# Patient Record
Sex: Female | Born: 1945
Health system: Southern US, Community
[De-identification: ages and names within clinical notes are randomized; demographics above are authoritative.]

## PROBLEM LIST (undated history)

## (undated) DIAGNOSIS — D49519 Neoplasm of unspecified behavior of unspecified kidney: Secondary | ICD-10-CM

## (undated) DIAGNOSIS — F329 Major depressive disorder, single episode, unspecified: Secondary | ICD-10-CM

## (undated) DIAGNOSIS — I209 Angina pectoris, unspecified: Secondary | ICD-10-CM

## (undated) DIAGNOSIS — I10 Essential (primary) hypertension: Secondary | ICD-10-CM

## (undated) DIAGNOSIS — R0789 Other chest pain: Secondary | ICD-10-CM

## (undated) DIAGNOSIS — F32A Depression, unspecified: Secondary | ICD-10-CM

## (undated) DIAGNOSIS — F0280 Dementia in other diseases classified elsewhere without behavioral disturbance: Secondary | ICD-10-CM

## (undated) DIAGNOSIS — E785 Hyperlipidemia, unspecified: Secondary | ICD-10-CM

## (undated) DIAGNOSIS — E669 Obesity, unspecified: Secondary | ICD-10-CM

## (undated) HISTORY — DX: Other chest pain: R07.89

## (undated) HISTORY — PX: HERNIA REPAIR: SHX51

## (undated) HISTORY — PX: ABDOMINAL HYSTERECTOMY: SHX81

## (undated) HISTORY — DX: Depression, unspecified: F32.A

## (undated) HISTORY — DX: Major depressive disorder, single episode, unspecified: F32.9

## (undated) HISTORY — DX: Obesity, unspecified: E66.9

## (undated) HISTORY — DX: Hyperlipidemia, unspecified: E78.5

## (undated) HISTORY — PX: TONSILLECTOMY: SUR1361

## (undated) HISTORY — PX: EMBOLIZATION: SHX1496

---

## 1999-11-10 ENCOUNTER — Ambulatory Visit (HOSPITAL_COMMUNITY): Admission: RE | Admit: 1999-11-10 | Discharge: 1999-11-10 | Payer: Self-pay | Admitting: Family Medicine

## 1999-11-10 ENCOUNTER — Encounter: Payer: Self-pay | Admitting: Family Medicine

## 1999-12-24 ENCOUNTER — Encounter: Payer: Self-pay | Admitting: Family Medicine

## 1999-12-24 ENCOUNTER — Ambulatory Visit (HOSPITAL_COMMUNITY): Admission: RE | Admit: 1999-12-24 | Discharge: 1999-12-24 | Payer: Self-pay | Admitting: Family Medicine

## 2000-02-02 ENCOUNTER — Other Ambulatory Visit: Admission: RE | Admit: 2000-02-02 | Discharge: 2000-02-02 | Payer: Self-pay | Admitting: Family Medicine

## 2001-01-31 ENCOUNTER — Encounter: Payer: Self-pay | Admitting: Emergency Medicine

## 2001-02-01 ENCOUNTER — Inpatient Hospital Stay (HOSPITAL_COMMUNITY): Admission: EM | Admit: 2001-02-01 | Discharge: 2001-02-04 | Payer: Self-pay | Admitting: Emergency Medicine

## 2001-02-16 ENCOUNTER — Encounter: Admission: RE | Admit: 2001-02-16 | Discharge: 2001-02-16 | Payer: Self-pay | Admitting: Family Medicine

## 2001-02-16 ENCOUNTER — Encounter: Payer: Self-pay | Admitting: Family Medicine

## 2001-02-17 ENCOUNTER — Emergency Department (HOSPITAL_COMMUNITY): Admission: EM | Admit: 2001-02-17 | Discharge: 2001-02-17 | Payer: Self-pay | Admitting: Emergency Medicine

## 2001-02-17 ENCOUNTER — Encounter: Payer: Self-pay | Admitting: Urology

## 2001-02-23 ENCOUNTER — Encounter: Admission: RE | Admit: 2001-02-23 | Discharge: 2001-02-23 | Payer: Self-pay | Admitting: Family Medicine

## 2001-03-16 ENCOUNTER — Encounter: Admission: RE | Admit: 2001-03-16 | Discharge: 2001-03-16 | Payer: Self-pay | Admitting: Urology

## 2001-03-16 ENCOUNTER — Encounter: Payer: Self-pay | Admitting: Urology

## 2001-03-28 ENCOUNTER — Encounter: Payer: Self-pay | Admitting: Urology

## 2001-03-28 ENCOUNTER — Ambulatory Visit (HOSPITAL_COMMUNITY): Admission: RE | Admit: 2001-03-28 | Discharge: 2001-03-29 | Payer: Self-pay | Admitting: Urology

## 2001-04-27 ENCOUNTER — Ambulatory Visit (HOSPITAL_COMMUNITY): Admission: AD | Admit: 2001-04-27 | Discharge: 2001-04-28 | Payer: Self-pay | Admitting: Interventional Radiology

## 2001-06-19 ENCOUNTER — Encounter: Payer: Self-pay | Admitting: Emergency Medicine

## 2001-06-19 ENCOUNTER — Inpatient Hospital Stay (HOSPITAL_COMMUNITY): Admission: EM | Admit: 2001-06-19 | Discharge: 2001-06-20 | Payer: Self-pay | Admitting: Emergency Medicine

## 2001-06-29 ENCOUNTER — Ambulatory Visit (HOSPITAL_COMMUNITY): Admission: RE | Admit: 2001-06-29 | Discharge: 2001-06-29 | Payer: Self-pay | Admitting: Urology

## 2001-06-29 ENCOUNTER — Encounter: Payer: Self-pay | Admitting: Urology

## 2001-09-13 ENCOUNTER — Encounter: Payer: Self-pay | Admitting: Emergency Medicine

## 2001-09-13 ENCOUNTER — Emergency Department (HOSPITAL_COMMUNITY): Admission: EM | Admit: 2001-09-13 | Discharge: 2001-09-13 | Payer: Self-pay | Admitting: Emergency Medicine

## 2001-10-01 ENCOUNTER — Other Ambulatory Visit: Admission: RE | Admit: 2001-10-01 | Discharge: 2001-10-01 | Payer: Self-pay | Admitting: Gynecology

## 2001-12-21 ENCOUNTER — Encounter: Payer: Self-pay | Admitting: Urology

## 2001-12-21 ENCOUNTER — Ambulatory Visit (HOSPITAL_COMMUNITY): Admission: RE | Admit: 2001-12-21 | Discharge: 2001-12-21 | Payer: Self-pay | Admitting: Urology

## 2002-05-24 ENCOUNTER — Ambulatory Visit (HOSPITAL_COMMUNITY): Admission: RE | Admit: 2002-05-24 | Discharge: 2002-05-24 | Payer: Self-pay | Admitting: Urology

## 2002-05-24 ENCOUNTER — Encounter: Payer: Self-pay | Admitting: Urology

## 2002-08-28 ENCOUNTER — Encounter: Payer: Self-pay | Admitting: Emergency Medicine

## 2002-08-28 ENCOUNTER — Emergency Department (HOSPITAL_COMMUNITY): Admission: EM | Admit: 2002-08-28 | Discharge: 2002-08-28 | Payer: Self-pay | Admitting: Emergency Medicine

## 2002-10-18 ENCOUNTER — Encounter: Payer: Self-pay | Admitting: Family Medicine

## 2002-10-18 ENCOUNTER — Ambulatory Visit (HOSPITAL_COMMUNITY): Admission: RE | Admit: 2002-10-18 | Discharge: 2002-10-18 | Payer: Self-pay | Admitting: Family Medicine

## 2002-10-31 HISTORY — PX: COLONOSCOPY: SHX174

## 2002-12-02 ENCOUNTER — Other Ambulatory Visit: Admission: RE | Admit: 2002-12-02 | Discharge: 2002-12-02 | Payer: Self-pay | Admitting: Gynecology

## 2003-01-20 ENCOUNTER — Ambulatory Visit (HOSPITAL_COMMUNITY): Admission: RE | Admit: 2003-01-20 | Discharge: 2003-01-20 | Payer: Self-pay | Admitting: Gastroenterology

## 2004-10-31 HISTORY — PX: CHOLECYSTECTOMY: SHX55

## 2005-01-04 ENCOUNTER — Emergency Department (HOSPITAL_COMMUNITY): Admission: EM | Admit: 2005-01-04 | Discharge: 2005-01-04 | Payer: Self-pay | Admitting: Emergency Medicine

## 2005-08-16 ENCOUNTER — Encounter: Admission: RE | Admit: 2005-08-16 | Discharge: 2005-08-16 | Payer: Self-pay | Admitting: Family Medicine

## 2005-08-26 ENCOUNTER — Inpatient Hospital Stay (HOSPITAL_COMMUNITY): Admission: EM | Admit: 2005-08-26 | Discharge: 2005-08-28 | Payer: Self-pay | Admitting: Emergency Medicine

## 2005-08-26 ENCOUNTER — Encounter: Admission: RE | Admit: 2005-08-26 | Discharge: 2005-08-26 | Payer: Self-pay | Admitting: Gastroenterology

## 2005-08-27 ENCOUNTER — Encounter (INDEPENDENT_AMBULATORY_CARE_PROVIDER_SITE_OTHER): Payer: Self-pay | Admitting: *Deleted

## 2006-10-10 ENCOUNTER — Encounter: Admission: RE | Admit: 2006-10-10 | Discharge: 2006-10-10 | Payer: Self-pay | Admitting: Family Medicine

## 2007-10-24 ENCOUNTER — Encounter: Admission: RE | Admit: 2007-10-24 | Discharge: 2007-10-24 | Payer: Self-pay | Admitting: Family Medicine

## 2007-11-01 HISTORY — PX: CARDIAC CATHETERIZATION: SHX172

## 2007-12-19 ENCOUNTER — Encounter: Admission: RE | Admit: 2007-12-19 | Discharge: 2007-12-19 | Payer: Self-pay | Admitting: General Surgery

## 2007-12-21 ENCOUNTER — Ambulatory Visit (HOSPITAL_BASED_OUTPATIENT_CLINIC_OR_DEPARTMENT_OTHER): Admission: RE | Admit: 2007-12-21 | Discharge: 2007-12-21 | Payer: Self-pay | Admitting: General Surgery

## 2007-12-21 ENCOUNTER — Encounter (INDEPENDENT_AMBULATORY_CARE_PROVIDER_SITE_OTHER): Payer: Self-pay | Admitting: General Surgery

## 2008-02-06 ENCOUNTER — Observation Stay (HOSPITAL_COMMUNITY): Admission: EM | Admit: 2008-02-06 | Discharge: 2008-02-07 | Payer: Self-pay | Admitting: Emergency Medicine

## 2009-02-20 ENCOUNTER — Ambulatory Visit: Payer: Self-pay | Admitting: Vascular Surgery

## 2009-02-20 ENCOUNTER — Emergency Department (HOSPITAL_COMMUNITY): Admission: EM | Admit: 2009-02-20 | Discharge: 2009-02-20 | Payer: Self-pay | Admitting: Emergency Medicine

## 2009-02-20 ENCOUNTER — Encounter (INDEPENDENT_AMBULATORY_CARE_PROVIDER_SITE_OTHER): Payer: Self-pay | Admitting: Emergency Medicine

## 2011-03-15 NOTE — H&P (Signed)
NAMEMAHA, FISCHEL          ACCOUNT NO.:  [id number]   MEDICAL RECORD NO.:  [id number]          PATIENT TYPE:  INP   LOCATION:  2013                         FACILITY:  [facility]   PHYSICIAN:  Colleen Can. Deborah Chalk, M.D.DATE OF BIRTH:  Jun 10, 1946   DATE OF ADMISSION:  02/06/2008  DATE OF DISCHARGE:                              HISTORY & PHYSICAL   REASON FOR ADMISSION:  Substernal chest pain and left arm pain, rule out  myocardial infarction.   HISTORY:  [name] is a 65 year old female who has had  hypertension over the last 1-2 years.  It has been difficult to control  and somewhat intermittently elevated.  She was due to be seen in  followup with Dr. Collins Scotland today for hypertension that has been elevated  for the past 2 weeks.  She had been having some increasing indigestion  and chest discomfort over the last few months, but has been very  intermittent.  She has not really told any one about that.  However,  today, she began having substernal discomfort that was in her chest and  radiated to her left arm.  She was given 2 nitroglycerin in [facility]  [facility] with some partial relief and then 2 nitroglycerin with EMS.  She  was a little short of breath and somewhat lightheaded.  She is referred  to the hospital for evaluation.  EKG had shown T-wave inversion  anteriorly.   About 2 weeks ago, she had some back spasms and subsequently developed  chest discomfort, but that lasted several hours through the middle of  the night and was not evaluated.   She has had a history of chest pain in the past.  She had cardiac  catheterization in 2002 with marked elevation of her blood pressure at  that point in time.  She just finished having embolization to a tumor in  her left kidney.  The catheterization at that point in time showed  normal coronary arteries.  She had a stress Cardiolite study in June  2007 for chest pain and had normal ejection fraction and no significant  ischemia,  but she did have what was felt to be breast attenuation  anteriorly.   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  She has had a  cholecystectomy, hysterectomy, and recently has had umbilical hernia  surgery.   ALLERGIES:  None known.   FAMILY HISTORY:  Positive for heart disease in the father as well as  diabetes and hypertension.   SOCIAL HISTORY:  She is married.  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  No tobacco or alcohol.   PHYSICAL EXAMINATION:  VITAL SIGNS:  She is obese with weight of  approximately 250-260 pounds.  Her blood pressure was 166/86 and heart  rate 67.  HEENT:  Negative.  NECK:  Supple.  LUNGS:  Clear.  HEART:  Regular rate and rhythm.  ABDOMEN:  Soft, obese.  EXTREMITIES:  Without edema.  Peripheral pulses are intact.  IMPRESSION:  1. Chest pain, rule out myocardial infarction.  2. Diabetes mellitus, on medication.  3. Hyperlipidemia.  4. Hypertension.  5. Obesity.   CURRENT MEDICINES:  1. Celexa 40 mg once a day.  2. Diovan 320 mg a day.  3. Simvastatin 40 mg a day.  4. Flexeril.  5. Fosamax 70 mg once a week.  6. Ibuprofen 800 mg as needed.  7. Metformin 500 mg twice a day.  8. Valtrex 500 mg once a day.  9. Verapamil ER 180 mg once a day.  10.Vicodin 500/5 p.o.   We will admit her, continue home medicines, rule out myocardial  infarction, treat her with IV nitroglycerin and IV heparin.  I think it  will be reasonable to proceed on with cardiac catheterization and  coronary arteriograms.  Hopefully, consider possibility of renal  arteriogram at the same time because of the poorly controlled  hypertension.      Colleen Can. Deborah Chalk, M.D.  Electronically Signed     SNT/MEDQ  D:  02/06/2008  T:  02/07/2008  Job:  [id number]   cc:   Tammy R. Collins Scotland, M.D.  Elmore Guise., M.D.

## 2011-03-15 NOTE — Discharge Summary (Signed)
[name], [name]          ACCOUNT NO.:  [id number]   MEDICAL RECORD NO.:  [id number]          PATIENT TYPE:  OBV   LOCATION:  2013                         FACILITY:  [facility]   PHYSICIAN:  Elmore Guise., M.D.DATE OF BIRTH:  [birth year]   DATE OF ADMISSION:  02/06/2008  DATE OF DISCHARGE:  02/07/2008                               DISCHARGE SUMMARY   DISCHARGE DIAGNOSES:  1. Chest pain.  2. Normal coronary arteries by cardiac catheterization.  3. Diabetes mellitus.  4. Dyslipidemia.  5. Hypertension.  6. Obesity.   HISTORY OF PRESENT ILLNESS:  Ms. Klostermann is a 65 year old white  female who presented with chest pain.  She was admitted for observation  and possible cardiac catheterization.   HOSPITAL COURSE:  The patient was seen by Dr. Deborah Chalk and was admitted  for observation overnight.  She was scheduled for cardiac  catheterization on February 07, 2008.  She tolerated her catheterization  well.  Her catheterization showed normal-appearing coronary arteries.  She had good blood pressure control during her hospitalization.  She had  no further chest pain.  Her cardiac markers were negative.  She will be  discharged today to continue post-catheterization restrictions for the  next 48 hours.   Her discharge medications include:  1. Celexa 40 mg daily.  2. Diovan 320 mg daily.  3. Simvastatin 40 mg daily.  4. Duratuss as needed for cough.  5. Flexeril 10 mg as needed for muscle spasm.  6. Fosamax 70 mg weekly.  7. Ibuprofen p.r.n.  8. Valtrex 500 mg daily.  9. Verapamil 180 mg daily.  10.Metformin 500 mg twice daily (patient is not to restart her      metformin until Sunday, April 12).  11.Nitroglycerin 0.4 mg on an as-needed basis for possible vasospasm      component of her symptoms.  12.Aspirin 81 mg daily.   Her follow-up appointment will be with Dr. Reyes Ivan at [facility] in 1-2 weeks.  All of her questions were answered prior to   discharge.      Elmore Guise., M.D.  Electronically Signed     TWK/MEDQ  D:  02/07/2008  T:  02/08/2008  Job:  [id number]

## 2011-03-15 NOTE — Cardiovascular Report (Signed)
[name], [name]          ACCOUNT NO.:  [id number]   MEDICAL RECORD NO.:  [id number]          PATIENT TYPE:  OBV   LOCATION:  2013                         FACILITY:  [facility]   PHYSICIAN:  Elmore Guise., M.D.DATE OF BIRTH:  1946/03/05   DATE OF PROCEDURE:  DATE OF DISCHARGE:                            CARDIAC CATHETERIZATION   DATE OF PROCEDURE:  February 07, 2008.   INDICATIONS FOR PROCEDURE:  Recurrent chest pain, multiple cardiac risk  factors, evaluate for obstructive coronary disease.   PROCEDURE DESCRIPTION:  The patient brought to the cardiac  catheterization lab after appropriate informed consent.  She was prepped  and draped in sterile fashion.  Approximately 10 mL of 1% lidocaine was  used for local anesthesia.  A 5-French sheath was placed in the right  femoral artery without difficulty.  Coronary angiography, LV angiography  were then performed.  The patient tolerated the procedure well and was  transferred from the cardiac catheterization lab in stable condition.   FINDINGS:  1. Left Main:  Normal.  2. LAD:  Normal.  3. D1:  Small-to-moderate sized vessel, normal appearing.  4. LCX:  Nondominant, normal appearing.  5. OM-1:  Large vessel, normal appearing.  6. RCA:  Dominant, normal appearing.  7. PDA/PLV:  Both patent and normal appearing.  8. LV:  EF of 60%.  No wall motion abnormalities.  LVEDP is 18 mmHg.   IMPRESSION:  1. Normal-appearing coronary arteries.  2. Preserved LV systolic function and EF of 60%.   PLAN:  1. At this time, I would recommend aggressive risk factor modification      as indicated.      Elmore Guise., M.D.  Electronically Signed     TWK/MEDQ  D:  02/07/2008  T:  02/07/2008  Job:  [id number]

## 2011-03-15 NOTE — Op Note (Signed)
NAMECARA, [name]          ACCOUNT NO.:  [id number]   MEDICAL RECORD NO.:  [id number]          PATIENT TYPE:  AMB   LOCATION:  DSC                          FACILITY:  [facility]   PHYSICIAN:  Cherylynn Ridges, M.D.    DATE OF BIRTH:  05/15/1946   DATE OF PROCEDURE:  12/21/2007  DATE OF DISCHARGE:                               OPERATIVE REPORT   PREOPERATIVE DIAGNOSIS:  Periumbilical incisional hernia.   POSTOPERATIVE DIAGNOSIS:  Periumbilical incisional hernia, 3 cm size of  ventral hernia defect.   PROCEDURE:  Repair of periumbilical incisional hernia with mesh.   SURGEON:  Cherylynn Ridges, M.D.   ANESTHESIA:  General endotracheal.   ESTIMATED BLOOD LOSS:  Less than 30 mL.   COMPLICATIONS:  No complications.   FINDINGS:  Omental incarceration in the hernia sac.   CONDITION:  Is stable.   SPECIMEN:  Was omentum and hernia sac.   INDICATIONS FOR OPERATION:  The patient is a 65 year old who had  undergone a laparoscopic cholecystectomy and developed a postoperative  incisional periumbilical hernia who comes in for repair.   FINDINGS:  The omentum was partially incarcerated in the sac itself.  There was a loop of bowel which could be pulled up into the wound with  the omentum but we left that behind and resected a small piece of  omentum along with the hernia sac.   PROCEDURE IN DETAIL:  The patient was taken to the operating room and  placed on the table in supine position.  After an adequate general  anesthetic was administered she was prepped and draped in the usual  sterile manner exposing the periumbilical area.   A 10 cm long infraumbilical curvilinear incision was made transversely  using a #10 blade.  We then dissected down into the subcutaneous tissue  around the large hernia sac which had incarcerated omentum contained  within.  We dissected out the hernia sac at its neck circumferentially  mobilizing it away from the skin superiorly.  This was done with  electrocautery and with Metzenbaum scissors.  We made an adequate  clearance around the fascia circumferentially in order to attach our  mesh once we had mobilized the sac and the contents.  We opened the sac  and then transected it at the fascial edge and then transected a piece  of omentum which fell back into the peritoneal cavity.  However, as we  were tenting it up we could see a loop of bowel  that was attached to it  that was far away from the area of resection.  The omentum was taken  with Tresa Endo clamps and 2-0 Vicryl ties.  Once this had fallen back into  the peritoneal cavity we reapproximated the fascia using interrupted #1  Novofil sutures.  A small piece of mesh measuring 5 x 3 cm in size was  attached on top of the repair, an onlay piece of polypropylene mesh  measuring 5 x 3 and attached in four quadrants using #1 Novofil pop-offs  and then we secured it in place with a running #1 Prolene.  Once this  was done we irrigated  it with antibiotic solution in which the mesh had  been soaked prior to implantation.  We closed in a couple layers.  The  subcu was closed using interrupted 3-0 Vicryl sutures and the skin was  closed using a running subcuticular stitch of 4-0 Monocryl.  Half  percent Marcaine without epi was injected into the wound, approximately  20 mL used.  Dermabond, Steri-Strips and Tegaderm were used to complete  the dressing.  All needle counts, sponge counts and instrument counts  were correct.      Cherylynn Ridges, M.D.  Electronically Signed     JOW/MEDQ  D:  12/21/2007  T:  12/22/2007  Job:  [id number]

## 2011-03-18 NOTE — Discharge Summary (Signed)
[place]. [facility]  Patient:    EMBERLY, TOMASSO Visit Number: [id number] MRN: [id number]          Service Type: MED Location: [id number] Attending Physician:  Eleanora Neighbor Dictated by:   Jennet Maduro Earl Gala, R.N., A.N.P. Adm. Date:  06/19/2001 Disc. Date: 06/20/2001   CC:         Barron Alvine, M.D.  Dario Guardian, M.D.   Discharge Summary  DISCHARGE DIAGNOSIS:  Chest pain with subsequent elective coronary angiography revealing normal coronary arteries and normal left ventricular function.  HISTORY OF PRESENT ILLNESS:  Ms. Lindquist is a very pleasant 65 year old, white female who has basically has had no past history of cardiac disorder. She has had some transient elevation in her blood pressure, but she is currently on no medications.  She presented to the emergency room with episodes of chest discomfort that started at about 9:30 a.m. while she was at work.  At that time, she had gross elevation in her blood pressure and was sent to the emergency room where she was given nitroglycerin as well as IV morphine.  Her discomfort did improve.  It was somewhat associated with nausea.  She was subsequently admitted for further evaluation.  She has had had a past history of a stress Cardiolite study in 1997, which was unremarkable.  Please see the dictated History and Physical for further patient presentation and profile.  LABORATORY DATA AND X-RAY FINDINGS:  Chemistries were unremarkable.  Cardiac enzymes were negative.  CBC was normal.  EKG showed nonspecific ST and T wave changes.  HOSPITAL COURSE:  The patient was admitted to telemetry.  She ruled out negative for myocardial infarction.  She was maintained on IV nitroglycerin and heparin which had been started in the emergency room department.  We proceeded on with cardiac catheterization on June 20, 2001.  The procedure was tolerated well without any unknown complications.   The coronaries were noted to be normal.  Left ventricular function was also normal.  There were angiograms taken of the kidneys.  She has had previous embolization and these films will be available for radiology as well as for Dr. Noralee Chars review at their discretion.  Our plans are now made for her to have her nitroglycerin discontinued.  She will be transferred back to the floor.  After satisfactory bed rest, she is felt to be a stable candidate for discharge later on this evening with further evaluation on an outpatient basis.  CONDITION ON DISCHARGE:  Stable.  DISCHARGE MEDICATIONS:  None at this time.  ACTIVITY:  Light for the next few days.  WOUND CARE:  She is to remove the dressing from the groin in the morning and to apply a Band-Aid with Neosporin for the next two to three days.  FOLLOWUP:  She will follow up with our office with any problems that arise. We will have her monitor her blood pressure daily and keep a diary of this and follow up with Dr. Merri Brunette over the next two to three weeks or sooner if any problems arise. Dictated by:   Jennet Maduro Earl Gala, R.N., A.N.P. Attending Physician:  Eleanora Neighbor DD:  06/20/01 TD:  06/21/01 Job: [id number]

## 2011-03-18 NOTE — Consult Note (Signed)
[place]. [facility]  Patient:    [name], [name]                            MRN: [id number] Adm. Date:  [id number] Disc. Date: [id number] Attending:  Cathren Laine CC:         Tammy R. Collins Scotland, M.D.   Consultation Report  CHIEF COMPLAINT:  Increasing left flank pain.  HISTORY OF PRESENT ILLNESS:  Ms. Moskal is well known to me from a previous admission.  She is a 65 year old female.  She presented on the third of this month through the [facility] Emergency Room with severe left flank pain.  She was thought to have renal colic but a CT scan revealed a retroperitoneal hemorrhage.  She also had two lesions in her kidney consistent with angiomyolipomas and the feeling was that the patient had had a spontaneous bleed into the perinephric space because of one of the angiomyolipomas.  She was hospitalized for pain control and observed.  Her hemoglobin was 13.6; had gone down to approximately 8.  She remained stable and was eventually discharged.  She was seen recently in consultation in our office for followup and her hemoglobin had increased to 10.  Her flank pain was improving.  She contacted me today with some increasing left flank pain and also some increased discomfort with deep breath suggesting some pleuritic component.  We felt that given her increased pain it would be worthwhile having a repeat hemoglobin performed as well as a repeat CT scan to see how things looked.  We wanted to be sure she was not developing any other obvious increased bleed or evidence of other pathology.  The patient did present to the emergency room. A hemoglobin was noted to be increased to 11.1.  Her white blood cell count was normal.  Vital signs were all within normal limits.  She had a repeat abdominal and pelvic CT with IV and oral contrast.  This showed slight decrease in the size of her perinephric hematoma.  There continued to be two lesions in her kidney suspicious for  angiomyolipoma.  PAST MEDICAL HISTORY:  Unchanged from my previous evaluation with her.  FAMILY HISTORY:  Unchanged from my previous evaluation with her.  SOCIAL HISTORY:  Unchanged from my previous evaluation with her.  REVIEW OF SYSTEMS:  Unchanged from my previous evaluation with her.  PHYSICAL EXAMINATION  GENERAL:  She is a well-developed, well-nourished female.  She does not appear to be in any acute distress at this time.  VITAL SIGNS:  Blood pressure 121/82, pulse 95.  She is afebrile.  ABDOMEN:  Soft.  She has some mild left CVA tenderness but it is not really severe.  DATA:  In addition to her normal hemoglobin and white count she has normal renal function and normal urinalysis.  ASSESSMENT:  Stable left perinephric hematoma.  If anything, this has slightly decreased in size.  Her pain may be due to some mild diaphragmatic irritation or even a small little reactive pleural effusion.  I see no evidence of anything else more significant.  She will keep a normal followup for additional repeat imaging studies.  Eventually she will require either partial nephrectomy or possible angio ablation of this lesion. DD:  02/17/01 TD:  02/19/01 Job: [id number]

## 2011-03-18 NOTE — Op Note (Signed)
NAMEKYLENA, MOLE          ACCOUNT NO.:  [id number]   MEDICAL RECORD NO.:  [id number]          PATIENT TYPE:  INP   LOCATION:  6729                         FACILITY:  [facility]   PHYSICIAN:  Cherylynn Ridges, M.D.    DATE OF BIRTH:  02-Mar-1946   DATE OF PROCEDURE:  08/27/2005  DATE OF DISCHARGE:  08/28/2005                                 OPERATIVE REPORT   PREOPERATIVE DIAGNOSIS:  Cholelithiasis and acute cholecystitis.   POSTOPERATIVE DIAGNOSIS:  Chronic cholecystitis with abdominal adhesions.   PROCEDURES:  1.  Laparoscopic cholecystectomy with cholangiogram.  2.  Laparoscopic enterolysis.   SURGEON:  Cherylynn Ridges, M.D.   ASSISTANT:  Currie Paris, M.D.   ANESTHESIA:  General endotracheal.   INDICATIONS FOR OPERATION:  The patient is a 65 year old female with  abdominal pain and right upper quadrant and known gallstones who comes in  for cholecystectomy.   FINDINGS:  The patient had, in spite of not having had any laparoscopy were  laparotomy performed, significant number of abdominal adhesions and right  upper quadrant. These had to be taken down initially.  The gallbladder was  adhered to surrounding structures with chronic adhesions.  No acute  inflammation noted.   OPERATION:  The patient was taken to the operating room, placed on table in  supine position. After an adequate endotracheal anesthetic was administered,  she was prepped and draped in usual sterile manner exposing the midline and  right upper quadrant.   A supraumbilical curvilinear incision was made using #11 blade and taken  down to the midline fascia. It was at this level that the fascia was grasped  using Kocher clamps and an incision made between it using a #11 blade. We  bluntly dissected down into the peritoneum through the fascial incision and  then used a 0 Vicryl pursestring suture to put in place to secure the [facility]  cannula. We then passed the Hasson cannula into the peritoneal  cavity and  then insufflated carbon dioxide gas up to maximal intra-abdominal pressure  of 50 mmHg.   With the Hasson cannulae in place we could see there were multiple abdominal  adhesions in the right upper quadrant, some of which we could get around  using the laparoscope. We were able to place a subxiphoid 11 mm cannula and  then subsequently using laparoscopic scissors in order to taken down these  adhesions to free up the right upper quadrant intraperitoneal space. Once  this was done, two 5 mm cannulas were passed under direct vision. Once this  was done, the patient was placed in reverse Trendelenburg, the left side was  tilted down and the dissection begun.   We grasped the gallbladder with a ratcheted grasper and there were omental  adhesions to the dome and the body. We dissected these away bluntly using a  grasper and then freed up the infundibulum. We identified the peritoneum  overlying the triangle of Calot and hepatic duodenal triangle and bluntly  dissected out the cystic duct and the cystic artery. We were able to get  adequate levels and encircled both the duct and artery, placed Endoclips  on  the artery initially and transected. We then circumferentially identified  the cystic duct placed a clip along the gallbladder side and did a  cholecystodochotomy through which a Cook catheter was passed in order to  perform a cholangiogram. The cholangiogram showed good flow into the common  duct, good proximal flow but initially flow into the duodenum did not go. We  waited for approximately 10 minutes and still there was no flow into the  duodenum, however, it did pass through once the patient had glucagon on  board for approximately three minutes. There was good flow into the  duodenum. No evidence of intraductal stones.   The [facility] catheter was removed. We endoclipped the cystic duct distally x2  and transected the cystic duct and dissected out the gallbladder from its  bed  with minimal difficulty. We cauterized the bed for hemostasis, irrigated  with saline, then removed gallbladder through the supraumbilical fascia  using an EndoCatch bag. All counts were correct. We irrigated with saline  and then subsequently we tied out the supraumbilical fascial site using the  pursestring suture which was in place. All of the cannulas were removed. We  aspirated most the gas and fluid through the cannulas.   We closed the skin and all sites using running subcuticular stitch of 4-0  Vicryl. Marcaine 0.2% with epi was injected at all sites. Sterile dressing  was applied.      Cherylynn Ridges, M.D.  Electronically Signed     JOW/MEDQ  D:  08/27/2005  T:  08/28/2005  Job:  [id number]

## 2011-03-18 NOTE — H&P (Signed)
[place]. [facility]  Patient:    [name], [name]                            MRN: [id number] Adm. Date:  [id number] Attending:  Thermon Leyland CC:         Tammy R. Collins Scotland, M.D.   History and Physical  CHIEF COMPLAINT:  Severe left flank pain.  HISTORY OF PRESENT ILLNESS:  Ms. Birkhead is an otherwise healthy 65 year old white female with no previous urologic history. She was driving home from work this evening when she had the sudden onset of rather severe left flank pain. The pain quickly intensified. She contacted her husband and friend and did not feel that she could continue to drive her car. They brought her to the [facility] Emergency Room this evening. The patient was suspected of having renal colic and a non-contrast CT scan was performed. This demonstrated a large left perinephric hematoma. In addition, there were two small lesions in her left kidney that appeared to contain fat and were most consistent with probable angiomyolipoma. A contrast CT scan was performed. This confirmed the appearance of a rather large perinephric hematoma. Again the patient was thought to have a spontaneous renal bleed from a probable angiomyolipoma. She has continued to have fairly severe flank discomfort. She denies any previous abdominal imaging. Her pain is under better control.  PAST MEDICAL HISTORY:  Otherwise relatively unremarkable. She states that she had similar problems with obesity and has been exercising and taking weight watchers. She denies any other medical problems. She takes no other medications and has no allergies.  SOCIAL HISTORY:  She is not a smoker and does not consume alcohol.  FAMILY HISTORY:  No evidence for any seizure disorders, kidney stones or other significant abnormalities. She is unaware of any family members who may have angiomyolipomas in the past.  REVIEW OF SYSTEMS:  Negative for any fever or chills at home. She has had no voiding  symptoms and has had no gross hematuria.  PHYSICAL EXAMINATION:  GENERAL:  The patient is a well-developed, well-nourished female. She is awake and alert. She is in mild distress at this time.  VITAL SIGNS:  When she presented she was afebrile.  Her initial blood pressure was 183/105 with a heart ate of 97. This was upon admission when she was in severe discomfort. We have asked the emergency room to repeat her vital signs which are pending at this time.  ABDOMEN:  Obese but soft. She does have some left upper quadrant tenderness and significant left CVA tenderness. There is no evidence of any peritoneal irritation.  EXTREMITIES:  Show no significant edema or obvious tenderness.  DATA:  The patients BMET was within normal limits including normal renal function studies. Her urinalysis was essentially unremarkable though there were significant epithelial cells and a few white blood cells. Hemoglobin was 13 with a hematocrit of 37 on her ISTAT.  ASSESSMENT:  Left flank pain secondary to moderate to large left perinephric hematoma. The patient does not have any history of anticoagulation and there is no history of trauma. This appears to be a spontaneous hemorrhage. I explained to the patient and the family that this often is associated with some underlying renal pathology although occasionally a normal kidney will spontaneously bleed. The CT scan certainly suggests the presence of two small renal lesions which contain fat which certainly suggestive a very high likelihood of angiomyolipoma. I explained  to the family and the patient that angiomyolipoma or benign tumors can cause significant clinical problems including higher risk for bleed although it is unusual when they are small. Obviously another possibility would be a renal cell carcinoma although again the presence of fat makes that diagnosis much less likely. At this point, she needs to be admitted to the hospital for observation  for serial vital signs determination as well as hemoglobin and hematocrit checks. She will need IV fluids and certainly significant pain medication via PCA morphine pump to help control her discomfort. We will empirically start her on some antibiotics and help prevent infection of her hematoma. We will discuss her case with interventional radiology in the morning to determine whether an angiogram would be helpful and whether selective embolization of these small angiomyolipomas would be feasible. Otherwise we will let the patient be observed and if things are stable eventually she will be discharged home with repeat imaging in the future and possible partial nephrectomy if this is not amendable to angioembolization. We will check a CBC and coag studies in the morning. DD:  02/01/01 TD:  02/01/01 Job: [id number]

## 2011-03-18 NOTE — Op Note (Signed)
NAME:  [name], [name]                    ACCOUNT NO.:  [id number]   MEDICAL RECORD NO.:  [id number]                   PATIENT TYPE:  AMB   LOCATION:  ENDO                                 FACILITY:  [facility]   PHYSICIAN:  Anselmo Rod, M.D.               DATE OF BIRTH:  [birth year]   DATE OF PROCEDURE:  01/20/2003  DATE OF DISCHARGE:                                 OPERATIVE REPORT   PROCEDURE:  Colonoscopy.   ENDOSCOPIST:  Anselmo Rod, M.D.   INSTRUMENT USED:  Olympus video colonoscope (pediatric scope changed to an  adult scope).   INDICATION FOR PROCEDURE:  Rectal bleeding in a 65 year old white female  with a family history of colon cancer in her brother and breast cancer in  the maternal grandmother.  Rule out colonic polyps, masses, etc.   PREPROCEDURE PREPARATION:  Informed consent was procured from the patient.  The patient was fasted for eight hours prior to the procedure and prepped  with a bottle of Miralax and Gatorade the night prior to the procedure.   PREPROCEDURE PHYSICAL:  VITAL SIGNS:  The patient had stable vital signs.  NECK:  Supple.  CHEST:  Clear to auscultation.  S1, S2 regular.  ABDOMEN:  Soft with normal bowel sounds.   DESCRIPTION OF PROCEDURE:  The patient was placed in the left lateral  decubitus position and sedated with 100 mg of Demerol and 10 mg of Versed  intravenously.  Once the patient was adequately sedate and maintained on low-  flow oxygen and continuous cardiac monitoring, the Olympus video colonoscope  was advanced from the rectum to the cecum with difficulty.  The patient had  a very tortuous colon.  The pediatric colonoscope was used initially.  This  was withdrawn at the hepatic flexure and the procedure was repeated with the  adult colonoscope.  The patient's position was changed from the left lateral  to the supine position and abdominal pressure was applied to reach the  cecum.  A few early left-sided diverticula were  seen.  There was some  residual stool in the colon and in the cecal base.  No masses or polyps were  seen.  Small lesions could have been missed because of residual stool.  Multiple washes were done.  The patient tolerated the procedure well without  complications.  Small internal hemorrhoids were seen on retroflexion in the  rectum.   IMPRESSION:  1. Small, nonbleeding internal hemorrhoids.  2. Few early left-sided diverticula.  3. Some residual stool in the colon, small lesions could have been missed.  4. No large masses or polyps seen.   RECOMMENDATIONS:  1. A high-fiber diet has been recommended along with liberal fluid intake to     prevent worsening of her diverticular disease.  2.     Repeat colorectal cancer screening is recommended in the next five years     unless the patient develops any abnormal symptoms  in the interim.  3. Outpatient follow-up in the next two weeks or earlier if need be.                                               Anselmo Rod, M.D.    JNM/MEDQ  D:  01/20/2003  T:  01/20/2003  Job:  [id number]   cc:   Tammy R. Collins Scotland, M.D.  [address]  [place]  [place] [address]  Fax: [phone]

## 2011-03-18 NOTE — Cardiovascular Report (Signed)
[place]. [facility]  Patient:    [name], [name] Visit Number: [id number] MRN: [id number]          Service Type: MED Location: [id number] Attending Physician:  Eleanora Neighbor Proc. Date: 06/20/01 Adm. Date:  06/19/2001 Disc. Date: 06/20/2001   CC:         Barron Alvine, M.D.  Dario Guardian, M.D.   Cardiac Catheterization  HISTORY:  [name] presents to the emergency room with substernal chest pain and markedly elevated blood pressure.  Her chest pain was relieved with nitroglycerin.  She had slight nausea.  She had a recent kidney tumor with embolization on the left.  PROCEDURES PERFORMED:  Left heart catheterization with selective coronary angiography, left ventricular angiography, and angiography of the abdominal aorta with views of the left kidney in particularly.  TYPE AND SITE OF ENTRY:  Percutaneous right femoral artery with Perclose.  CATHETERS:  Six [place] #4 curved Judkins right and left coronary catheters, 6-French pigtail ventriculography catheter.  CONTRAST MATERIAL:  Omnipaque.  MEDICATIONS GIVEN PRIOR TO PROCEDURE:  Valium 10 mg p.o.  MEDICATIONS GIVEN DURING PROCEDURE:  Versed 3 mg IV and Fentanyl 25 mcg IV.  COMMENTS:  The patient tolerated the procedure well.  HEMODYNAMIC DATA: 1. The aortic pressure was 174/85. 2. Left ventricular pressure was 179/22. 3. There was no aortic valve gradient noted on pullback.  ANGIOGRAPHIC DATA: 1. Left main coronary artery:  Normal. 2. Left circumflex:  The left circumflex is a large vessel.  It has no    significant focal obstructive disease.  It continues as a large marginal    with multiple branches. 3. Left anterior descending:  The left anterior descending has a high diagonal    vessel.  It continues and courses over the apex.  It is normal. 4. Right coronary artery:  The right coronary artery is a large dominant    vessel.  It is normal.  LEFT VENTRICULAR  ANGIOGRAPHY:  Left ventricular angiogram was performed in the RAO position.  Overall cardiac size and silhouette are normal.  Left ventricular function and regional wall motion are normal.  There is no mitral regurgitation, intracardiac calcification, or intracavitary filling defect.  OVERALL IMPRESSIONS: 1. Normal left ventricular function. 2. Normal coronary arteries. 3. Angiogram of the abdominal aorta and left kidney demonstrate the emboli    vessels.  There does appear to be patency of an inferior vessel, but there    is no clear-cut tumor blush.  Cine films will be available for review by    radiologist in comparison to previous cut films. Attending Physician: Eleanora Neighbor DD:  06/20/01 TD:  06/20/01 Job: [id number]

## 2011-03-18 NOTE — H&P (Signed)
NAMEBORA, BOST          ACCOUNT NO.:  [id number]   MEDICAL RECORD NO.:  [id number]          PATIENT TYPE:  INP   LOCATION:  1827                         FACILITY:  [facility]   PHYSICIAN:  Wilmon Arms. Corliss Skains, M.D. DATE OF BIRTH:  1946-03-29   DATE OF ADMISSION:  08/26/2005  DATE OF DISCHARGE:                                HISTORY & PHYSICAL   CHIEF COMPLAINT:  Right upper quadrant abdominal pain.   HISTORY OF PRESENT ILLNESS:  This is a 65 year old white female who is a  patient of Dr. Charna Elizabeth who has had a history of gallstones. She presents  with several weeks of intermittent severe right upper quadrant and  epigastric pain. Initially, she felt that this was chest pain and she has  previously been evaluated in the emergency department for cardiac disease.  She was ruled out for cardiac disease and was diagnosed with  gastroesophageal reflux. She was started on a proton pump inhibitor and is  currently taking AcipHex. She presented to Dr. Loreta Ave several weeks ago with  right upper quadrant pain. She has been having daily bowel movements with no  melena or hematochezia. She was referred for an outpatient ultrasound. This  was performed earlier this morning and showed cholelithiasis but no evidence  of cholecystitis. Over the last several days the patient has been having  more frequent right upper quadrant pain. This morning, after her ultrasound,  she had a large breakfast including sausage. This precipitated a very severe  episode of right upper quadrant associated with nausea, bloating, and  belching. She presents to the emergency department for evaluation.   ALLERGIES:  None.   MEDICATIONS:  AcipHex, Diovan, hydrochlorothiazide, and Zocor.   PAST MEDICAL HISTORY:  1.  Hypertension.  2.  Gastroesophageal reflux disease.  3.  Hyperlipidemia.   PAST SURGICAL HISTORY:  None. The patient did have a benign tumor in her  kidney embolized because of a perinephric  hematoma.   PHYSICAL EXAMINATION:  VITAL SIGNS:  Blood pressure 168/92, heart rate 75,  temperature 98.6, respirations 22.  GENERAL:  This is an overweight female in no apparent distress.  HEENT:  EOMI, sclerae are anicteric.  LUNGS:  No respiratory distress. Clear to auscultation bilaterally.  HEART:  Regular rate and rhythm, no murmurs.  ABDOMEN:  Soft, tender in her right upper quadrant to deep palpation. There  is no rebound, no guarding, no masses palpated. No hepatosplenomegaly.  SKIN:  Warm, dry, no jaundice.   LABORATORY DATA:  Electrolytes are within normal limits. Her total bilirubin  is 0.9, alkaline phosphatase is 140, AST is 152, ALT is 68. CBC:  White  count 11.5, hemoglobin 12.9, platelet count 275. Lipase is 22.   IMPRESSION:  Probable acute cholecystitis.   PLAN:  Recommend laparoscopic cholecystectomy. However, since the patient  just ate a large meal we will place her on IV antibiotics and schedule this  for tomorrow. We will admit her to the hospital overnight for IV hydration.      Wilmon Arms. Tsuei, M.D.  Electronically Signed     MKT/MEDQ  D:  08/26/2005  T:  08/26/2005  Job:  [id number]   cc:   Anselmo Rod, M.D.  Fax: [phone]

## 2011-03-18 NOTE — H&P (Signed)
[place]. [facility]  Patient:    [name], [name] Visit Number: [id number] MRN: [id number]          Service Type: MED Location: [phone] Attending Physician:  Osvaldo Human Adm. Date:  06/19/2001   CC:         Barron Alvine, M.D.  Dario Guardian, M.D., [facility]   History and Physical  CHIEF COMPLAINT: Chest pain, rule out myocardial infarction.  HISTORY OF PRESENT ILLNESS: [name] presents to the emergency room with chest pain that started at about 9:30 a.m. when she was work.  She had a markedly elevated blood pressure.  She was sent to the emergency room and given nitroglycerin.  The discomfort was improved with nitroglycerin and morphine.  Pain was associated with slight nausea.  PAST MEDICAL HISTORY:  1. She has had a recent left kidney tumor and had embolization.  2. She has had prior chest pain with negative stress test (Cardiolite)     in 1997.  3. History of hypertension, on no meds.  4. History of alopecia.  ALLERGIES: None.  MEDICATIONS: None.  FAMILY HISTORY: Mother and father are both in their 67s and alive and well. No heart disease.  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  There is no smoking.  She exercises regularly and has lost about 40 pounds of weight recently.  REVIEW OF SYSTEMS: Otherwise unremarkable.  She does have occasional hematuria.  PHYSICAL EXAMINATION:  GENERAL: Currently she does have a headache.  There is no chest pain currently.  SKIN: Warm and dry, color normal and tan.  VITAL SIGNS: Blood pressure 137/77, heart rate 73, respiratory rate 18.  LUNGS: Clear.  HEART: Regular rate and rhythm.  ABDOMEN: Soft, moderately obese.  EXTREMITIES: Without edema.  LABORATORY DATA: WBC 6400, hematocrit 37.  Chemistries unremarkable.  CK and troponins are negative.  EKG shows nonspecific ST-T wave changes.  OVERALL IMPRESSION:  1. Chest  pain, rule out myocardial infarction.  2. History of hypertension.  3. Previous history of chest pain.  4. History of left tumor embolization.  PLAN: We will proceed on with cardiac catheterization and coronary arteriogram in the morning.  The procedure, risks and benefits have been explained and the patient is willing to proceed. Attending Physician:  Osvaldo Human DD:  06/19/01 TD:  06/20/01 Job: [id number]

## 2011-03-18 NOTE — Discharge Summary (Signed)
[place]. [facility]  Patient:    [name], [name]                            MRN: [id number] Adm. Date:  [id number] Disc. Date: [id number] Attending:  Cathren Laine                           Discharge Summary  DISCHARGE DIAGNOSES: 1. Spontaneous retroperitoneal/perinephric hematoma. 2. Probable angiomyolipoma of the kidney.  HISTORY OF PRESENT ILLNESS:  This is a 65 year old female.  She had no previous history of any urologic diseases.  She had the sudden onset of fairly severe left flank pain. She was driving home from work when she developed her severe pain.  She presented to the [facility] Emergency Room for evaluation. The initial diagnosis was probable renal colic and a CT scan was performed. This demonstrated a large left perinephric hematoma.  In addition, there were two small lesions in the left kidney that appeared to contain fat and were felt by the radiologist to be probable angiomyolipomas.  A contrast CT scan was then performed which confirmed the above findings.  The patient was felt to have a spontaneous bleed of her kidney probably from the angiomyolipoma with the bleed into Gerotas fascia and having a perinephric hematoma.  The patient was hemodynamically stable.  Her initial hemoglobin was 13.  She was admitted for pain control and observation.  HOSPITAL COURSE:  On hospital day #1, her hemoglobin had dropped to 10.6.  She remained hemodynamically stable.  She was also placed on Keflex to prevent any infection.  She received IV morphine to help with her pain and she underwent continued observation.  Her hemoglobin stabilized at around 9.0.  When she was discharged on February 04, 2001, her hemoglobin was 8.8.  At the time of discharge, her flank pain had improved.  Again, she remained hemodynamically stable.  She did have a fever throughout part of her hospitalization and was felt to be due to the hematoma.  We discussed her case with  interventional radiology about the potential timing for intervention if necessary.  The patient will subsequently need either probable partial nephrectomy or possible angio-ablation of this angiomyolipoma to prevent future rebleeding.  DISPOSITION:  The patient was discharged home on February 04, 2001.  At that time, she was stable.  DISCHARGE MEDICATIONS: 1. Pain medication. 2. Keflex.  FOLLOWUP:  She will follow up in our office in about two weeks for repeat imaging and sooner if she has increased flank pain or other problems. DD: 02/27/01 TD:  02/27/01 Job: [id number]

## 2011-07-22 LAB — DIFFERENTIAL
Basophils Absolute: 0
Basophils Relative: 1
Eosinophils Absolute: 0.1
Eosinophils Relative: 1
Lymphocytes Relative: 26
Lymphs Abs: 2.5
Monocytes Absolute: 0.7
Monocytes Relative: 7
Neutro Abs: 6.1
Neutrophils Relative %: 64

## 2011-07-22 LAB — CBC
HCT: 37
Hemoglobin: 12.5
MCHC: 33.7
MCV: 84.7
Platelets: 295
RBC: 4.37
RDW: 15.9 — ABNORMAL HIGH
WBC: 9.5

## 2011-07-22 LAB — BASIC METABOLIC PANEL
BUN: 10
CO2: 29
Calcium: 9.1
Chloride: 108
Creatinine, Ser: 0.96
GFR calc Af Amer: >60
GFR calc non Af Amer: 59 — ABNORMAL LOW
Glucose, Bld: 103 — ABNORMAL HIGH
Potassium: 4.2
Sodium: 141

## 2011-07-26 LAB — LIPID PANEL
Cholesterol: 170
HDL: 36 — ABNORMAL LOW
LDL Cholesterol: 116 — ABNORMAL HIGH
Total CHOL/HDL Ratio: 4.7
Triglycerides: 89
VLDL: 18

## 2011-07-26 LAB — APTT: aPTT: 118 — ABNORMAL HIGH

## 2011-07-26 LAB — TSH: TSH: 1.231

## 2011-07-26 LAB — DIFFERENTIAL
Basophils Absolute: 0.1
Basophils Relative: 1
Eosinophils Absolute: 0.2
Eosinophils Relative: 2
Lymphocytes Relative: 26
Lymphs Abs: 2.5
Monocytes Absolute: 0.8
Monocytes Relative: 8
Neutro Abs: 6.4
Neutrophils Relative %: 65

## 2011-07-26 LAB — COMPREHENSIVE METABOLIC PANEL
ALT: 18
AST: 18
Albumin: 3.4 — ABNORMAL LOW
Alkaline Phosphatase: 91
BUN: 10
CO2: 24
Calcium: 8.5
Chloride: 104
Creatinine, Ser: 0.85
GFR calc Af Amer: >60
GFR calc non Af Amer: >60
Glucose, Bld: 94
Potassium: 3.7
Sodium: 137
Total Bilirubin: 0.4
Total Protein: 6.2

## 2011-07-26 LAB — CBC
HCT: 34.8 — ABNORMAL LOW
HCT: 37.5
Hemoglobin: 12
Hemoglobin: 12.5
MCHC: 33.5
MCHC: 34.5
MCV: 85.6
MCV: 85.7
Platelets: 248
Platelets: 295
RBC: 4.07
RBC: 4.37
RDW: 15.1
RDW: 15.5
WBC: 8.4
WBC: 9.9

## 2011-07-26 LAB — CK TOTAL AND CKMB (NOT AT ARMC)
CK, MB: 1.2
Relative Index: INVALID
Total CK: 35

## 2011-07-26 LAB — POCT CARDIAC MARKERS
CKMB, poc: <1 — ABNORMAL LOW
Myoglobin, poc: 39.8
Operator id: 114141
Troponin i, poc: <0.05

## 2011-07-26 LAB — D-DIMER, QUANTITATIVE: D-Dimer, Quant: 0.44

## 2011-07-26 LAB — CARDIAC PANEL(CRET KIN+CKTOT+MB+TROPI)
CK, MB: 0.9
CK, MB: 0.9
Relative Index: INVALID
Relative Index: INVALID
Total CK: 37
Total CK: 48
Troponin I: 0.02
Troponin I: 0.02

## 2011-07-26 LAB — POCT I-STAT, CHEM 8
BUN: 12
Calcium, Ion: 1.17
Chloride: 106
Creatinine, Ser: 1.1
Glucose, Bld: 113 — ABNORMAL HIGH
HCT: 38
Hemoglobin: 12.9
Potassium: 3.8
Sodium: 142
TCO2: 27

## 2011-07-26 LAB — PROTIME-INR
INR: 1
Prothrombin Time: 12.9

## 2011-07-26 LAB — TROPONIN I: Troponin I: 0.01

## 2011-07-26 LAB — HEPARIN LEVEL (UNFRACTIONATED)
Heparin Unfractionated: 0.55
Heparin Unfractionated: 0.74 — ABNORMAL HIGH

## 2011-08-03 ENCOUNTER — Other Ambulatory Visit: Payer: Self-pay | Admitting: Family Medicine

## 2011-08-03 DIAGNOSIS — Z1231 Encounter for screening mammogram for malignant neoplasm of breast: Secondary | ICD-10-CM

## 2011-08-08 ENCOUNTER — Ambulatory Visit: Payer: Self-pay

## 2011-08-15 ENCOUNTER — Ambulatory Visit
Admission: RE | Admit: 2011-08-15 | Discharge: 2011-08-15 | Disposition: A | Discharge status: Home / Self Care | Payer: Medicare HMO | Source: Ambulatory Visit | Attending: Family Medicine | Admitting: Family Medicine

## 2011-08-15 DIAGNOSIS — Z1231 Encounter for screening mammogram for malignant neoplasm of breast: Secondary | ICD-10-CM

## 2011-08-31 DIAGNOSIS — F411 Generalized anxiety disorder: Secondary | ICD-10-CM | POA: Insufficient documentation

## 2011-09-18 ENCOUNTER — Emergency Department (HOSPITAL_COMMUNITY): Payer: Medicare HMO

## 2011-09-18 ENCOUNTER — Emergency Department (HOSPITAL_COMMUNITY)
Admission: EM | Admit: 2011-09-18 | Discharge: 2011-09-18 | Disposition: A | Discharge status: Home / Self Care | Payer: Medicare HMO | Attending: Emergency Medicine | Admitting: Emergency Medicine

## 2011-09-18 ENCOUNTER — Emergency Department (HOSPITAL_COMMUNITY)
Admission: EM | Admit: 2011-09-18 | Discharge: 2011-09-18 | Disposition: A | Discharge status: Nursing Home (Includes ICF) | Payer: Medicare HMO | Source: Home / Self Care | Attending: Emergency Medicine | Admitting: Emergency Medicine

## 2011-09-18 ENCOUNTER — Encounter (HOSPITAL_COMMUNITY): Payer: Self-pay | Admitting: Emergency Medicine

## 2011-09-18 ENCOUNTER — Other Ambulatory Visit: Payer: Self-pay

## 2011-09-18 DIAGNOSIS — J029 Acute pharyngitis, unspecified: Secondary | ICD-10-CM | POA: Insufficient documentation

## 2011-09-18 DIAGNOSIS — R221 Localized swelling, mass and lump, neck: Secondary | ICD-10-CM | POA: Insufficient documentation

## 2011-09-18 DIAGNOSIS — I10 Essential (primary) hypertension: Secondary | ICD-10-CM | POA: Insufficient documentation

## 2011-09-18 DIAGNOSIS — R220 Localized swelling, mass and lump, head: Secondary | ICD-10-CM | POA: Insufficient documentation

## 2011-09-18 DIAGNOSIS — R079 Chest pain, unspecified: Secondary | ICD-10-CM | POA: Insufficient documentation

## 2011-09-18 DIAGNOSIS — R5383 Other fatigue: Secondary | ICD-10-CM | POA: Insufficient documentation

## 2011-09-18 DIAGNOSIS — R05 Cough: Secondary | ICD-10-CM | POA: Insufficient documentation

## 2011-09-18 DIAGNOSIS — R059 Cough, unspecified: Secondary | ICD-10-CM | POA: Insufficient documentation

## 2011-09-18 DIAGNOSIS — R5381 Other malaise: Secondary | ICD-10-CM | POA: Insufficient documentation

## 2011-09-18 HISTORY — DX: Essential (primary) hypertension: I10

## 2011-09-18 HISTORY — DX: Angina pectoris, unspecified: I20.9

## 2011-09-18 HISTORY — DX: Neoplasm of unspecified behavior of unspecified kidney: D49.519

## 2011-09-18 LAB — DIFFERENTIAL
Basophils Absolute: 0 10*3/uL (ref 0.0–0.1)
Basophils Relative: 0 % (ref 0–1)
Eosinophils Absolute: 0.2 10*3/uL (ref 0.0–0.7)
Eosinophils Relative: 2 % (ref 0–5)
Lymphocytes Relative: 25 % (ref 12–46)
Lymphs Abs: 2.5 10*3/uL (ref 0.7–4.0)
Monocytes Absolute: 0.7 10*3/uL (ref 0.1–1.0)
Monocytes Relative: 7 % (ref 3–12)
Neutro Abs: 6.4 10*3/uL (ref 1.7–7.7)
Neutrophils Relative %: 65 % (ref 43–77)

## 2011-09-18 LAB — CBC
HCT: 40.1 % (ref 36.0–46.0)
Hemoglobin: 13.1 g/dL (ref 12.0–15.0)
MCH: 28.5 pg (ref 26.0–34.0)
MCHC: 32.7 g/dL (ref 30.0–36.0)
MCV: 87.4 fL (ref 78.0–100.0)
Platelets: 248 10*3/uL (ref 150–400)
RBC: 4.59 MIL/uL (ref 3.87–5.11)
RDW: 13.5 % (ref 11.5–15.5)
WBC: 9.9 10*3/uL (ref 4.0–10.5)

## 2011-09-18 LAB — BASIC METABOLIC PANEL
BUN: 9 mg/dL (ref 6–23)
CO2: 26 mEq/L (ref 19–32)
Calcium: 8.8 mg/dL (ref 8.4–10.5)
Chloride: 107 mEq/L (ref 96–112)
Creatinine, Ser: 0.74 mg/dL (ref 0.50–1.10)
GFR calc Af Amer: >90 mL/min (ref >90)
GFR calc non Af Amer: 87 mL/min — ABNORMAL LOW (ref >90)
Glucose, Bld: 109 mg/dL — ABNORMAL HIGH (ref 70–99)
Potassium: 3.6 mEq/L (ref 3.5–5.1)
Sodium: 142 mEq/L (ref 135–145)

## 2011-09-18 LAB — POCT I-STAT TROPONIN I
Troponin i, poc: 0 ng/mL (ref 0.00–0.08)
Troponin i, poc: 0 ng/mL (ref 0.00–0.08)

## 2011-09-18 LAB — POCT RAPID STREP A: Streptococcus, Group A Screen (Direct): NEGATIVE

## 2011-09-18 MED ORDER — ASPIRIN 325 MG PO TABS
325.0000 mg | ORAL_TABLET | Freq: Once | ORAL | Status: AC
  Administered 2011-09-18: 325 mg via ORAL
  Filled 2011-09-18: qty 1

## 2011-09-18 MED ORDER — NITROGLYCERIN 0.4 MG SL SUBL
0.4000 mg | SUBLINGUAL_TABLET | SUBLINGUAL | Status: DC | PRN
  Administered 2011-09-18: 0.4 mg via SUBLINGUAL

## 2011-09-18 MED ORDER — LIDOCAINE VISCOUS 2 % MT SOLN
20.0000 mL | Freq: Once | OROMUCOSAL | Status: AC
  Administered 2011-09-18: 20 mL via OROMUCOSAL

## 2011-09-18 MED ORDER — NITROGLYCERIN 0.4 MG SL SUBL
SUBLINGUAL_TABLET | SUBLINGUAL | Status: AC
  Filled 2011-09-18: qty 25

## 2011-09-18 NOTE — ED Notes (Signed)
Per [facility], pt from [facility].  States pt originally seen there for sore throat, but then c/o chest pain.  Was given NTG and pain is now gone.  IV 20g left AC by [facility].

## 2011-09-18 NOTE — ED Provider Notes (Signed)
History     CSN: [id number] Arrival date & time: 09/18/2011  2:09 PM   First MD Initiated Contact with Patient 09/18/11 1304      Chief Complaint  Patient presents with  . Sore Throat    Pt has sorethroat that started on Friday    (Consider location/radiation/quality/duration/timing/severity/associated sxs/prior treatment) HPI Comments: Pt with ST x 2 days, pain with swallowing, raspy voice, painful tender cervical LN. No fevers, vomiting, rhinorrhea, cough. Has been doing salt water gargles w/o relief. Pt also started c/o nonradiating left sided chest tightness while here. No diaphoresis, SOB, N/V.  BP noted to be elevated at 180/118. Similar episode several weeks ago while at rest, resolved with nitro. Notes increased fatigue over past few weeks.  Has not had problems with her angina for some time until several weeks ago. H/o HTN but has not taken BP meds in 2 years.  H/o angina, had normal cardiac cath in 2009.   Patient is a 65 y.o. female presenting with pharyngitis and chest pain. The history is provided by the patient. No language interpreter was used.  Sore Throat The current episode started 2 days ago. The problem has been gradually worsening. Associated symptoms include chest pain. Pertinent negatives include no abdominal pain and no shortness of breath. The symptoms are aggravated by swallowing. The symptoms are relieved by nothing. Treatments tried: gargled salt water.  Chest Pain The chest pain began less than 1 hour ago. Chest pain occurs intermittently. The pain is associated with stress and exertion. The quality of the pain is described as pressure-like. The pain does not radiate. Chest pain is worsened by stress. Primary symptoms include fatigue and dizziness. Pertinent negatives for primary symptoms include no fever, no syncope, no shortness of breath, no cough, no wheezing, no palpitations, no abdominal pain, no nausea and no vomiting.  Dizziness does not occur with nausea  or vomiting. She tried nitroglycerin for the symptoms.  Her past medical history is significant for hypertension.     Past Medical History  Diagnosis Date  . Hypertension   . Kidney tumor   . Anginal pain     Past Surgical History  Procedure Date  . Tonsillectomy   . Hernia repair   . Embolization   . Cardiac catheterization     Family History  Problem Relation Age of Onset  . Diabetes Mother     History  Substance Use Topics  . Smoking status: Never Smoker   . Smokeless tobacco: Not on file  . Alcohol Use: No    OB History    Grav Para Term Preterm Abortions TAB SAB Ect Mult Living                  Review of Systems  Constitutional: Positive for fatigue. Negative for fever.  HENT: Positive for sore throat and trouble swallowing. Negative for congestion, mouth sores, voice change and postnasal drip.   Respiratory: Negative for cough, shortness of breath and wheezing.   Cardiovascular: Positive for chest pain. Negative for palpitations and syncope.  Gastrointestinal: Negative for nausea, vomiting and abdominal pain.  Neurological: Positive for dizziness.    Allergies  Review of patient's allergies indicates no known allergies.  Home Medications  No current outpatient prescriptions on file.  BP 195/90  Pulse 93  Temp(Src) 98.1 F (36.7 C) (Oral)  Resp 18  SpO2 100%  Physical Exam  Nursing note and vitals reviewed. Constitutional: She is oriented to person, place, and time. She appears well-developed  and well-nourished. No distress.  HENT:  Head: Normocephalic and atraumatic.  Right Ear: Tympanic membrane normal.  Left Ear: Tympanic membrane normal.  Nose: Nose normal.  Mouth/Throat: Uvula is midline and mucous membranes are normal. Posterior oropharyngeal erythema present.       Tonsils absent. Erythema, apthous ulcers over soft palate, uvula. Airway widely patent.   Eyes: EOM are normal. Pupils are equal, round, and reactive to light.  Neck:  Normal range of motion.       Tender bilateral cervical LN  Cardiovascular: Normal rate, regular rhythm and normal heart sounds.   Pulmonary/Chest: Effort normal and breath sounds normal.  Abdominal: She exhibits no distension.  Musculoskeletal: Normal range of motion.  Lymphadenopathy:    She has cervical adenopathy.  Neurological: She is alert and oriented to person, place, and time.  Skin: Skin is warm and dry.  Psychiatric: She has a normal mood and affect. Her behavior is normal. Judgment and thought content normal.    ED Course  Procedures (including critical care time)   Labs Reviewed  POCT RAPID STREP A ([facility] CARE ONLY)   Results for orders placed during the hospital encounter of 09/18/11  POCT RAPID STREP A ([facility] ONLY)      Component Value Range   Streptococcus, Group A Screen (Direct) NEGATIVE  NEGATIVE     1. Chest pain   2. Viral pharyngitis   3. HTN   MDM  Repeat BP 195/90. No change in CP with nitro. No EKG changes here. Concern for hypertensive urgency. Transferring to ED   Date: 09/18/2011  Rate: 84     Rhythm: normal sinus rhythm  QRS Axis: normal  Intervals: normal  ST/T Wave abnormalities: normal  Conduction Disutrbances:none  Narrative Interpretation:   Old EKG Reviewed: none available   Luiz Blare, MD 09/18/11 1626

## 2011-09-18 NOTE — ED Notes (Signed)
Pt started having chest pain after arriving at [facility]

## 2011-09-18 NOTE — ED Provider Notes (Signed)
History     CSN: [id number] Arrival date & time: 09/18/2011  5:22 PM   First MD Initiated Contact with Patient 09/18/11 1743      Chief Complaint  Patient presents with  . Sore Throat    initial complaint at [facility]  . Chest Pain    (Consider location/radiation/quality/duration/timing/severity/associated sxs/prior treatment) Patient is a 65 y.o. female presenting with pharyngitis and chest pain.  Sore Throat Associated symptoms include chest pain.  Chest Pain Primary symptoms include fatigue and cough.    The patient is a 65 year old female who presents today after being seen initially at urgent care. Patient was therefore a sore throat. After waiting 3 hours the patient got upset and developed chest pain. She said that this was a 6/10. She described it as being left-sided and without radiation. She also referred to as a pressure. She was given nitroglycerin and transported to our emergency department for evaluation. She has no history of CAD. She has had recent upper respiratory symptoms. She denies smoking as well as other risk factors for possible coronary artery disease. Patient's pain has resolved since receiving nitroglycerin. She does now have a headache. There are no other associated or modifying factors.  Past Medical History  Diagnosis Date  . Hypertension   . Kidney tumor   . Anginal pain     Past Surgical History  Procedure Date  . Tonsillectomy   . Hernia repair   . Embolization   . Cardiac catheterization     Family History  Problem Relation Age of Onset  . Diabetes Mother     History  Substance Use Topics  . Smoking status: Never Smoker   . Smokeless tobacco: Not on file  . Alcohol Use: No    OB History    Grav Para Term Preterm Abortions TAB SAB Ect Mult Living                  Review of Systems  Constitutional: Positive for fatigue.  HENT: Positive for sore throat.   Eyes: Negative.   Respiratory: Positive for cough.   Cardiovascular:  Positive for chest pain.  Gastrointestinal: Negative.   Genitourinary: Negative.   Musculoskeletal: Negative.   Skin: Negative.   Neurological: Negative.   Hematological: Negative.   Psychiatric/Behavioral: Negative.   All other systems reviewed and are negative.    Allergies  Review of patient's allergies indicates no known allergies.  Home Medications   Current Outpatient Rx  Name Route Sig Dispense Refill  . NITROGLYCERIN 0.4 MG SL SUBL Sublingual Place 0.4 mg under the tongue every 5 (five) minutes as needed. Pt states continues to take PRN, but rx is out dated.       BP 153/75  Pulse 75  Temp(Src) 98.1 F (36.7 C) (Oral)  Resp 13  Ht 5\' 8"  (1.727 m)  Wt 245 lb (111.131 kg)  BMI 37.25 kg/m2  SpO2 100%  Physical Exam  Nursing note and vitals reviewed. Constitutional: She is oriented to person, place, and time. She appears well-developed and well-nourished. No distress.  HENT:  Head: Normocephalic and atraumatic.  Mouth/Throat: Posterior oropharyngeal edema present. No oropharyngeal exudate.  Eyes: Conjunctivae and EOM are normal. Pupils are equal, round, and reactive to light.  Neck: Normal range of motion.  Cardiovascular: Normal rate, regular rhythm, normal heart sounds and intact distal pulses.  Exam reveals no gallop and no friction rub.   No murmur heard. Pulmonary/Chest: Effort normal and breath sounds normal. No respiratory distress. She has  no wheezes. She has no rales.  Abdominal: Soft. Bowel sounds are normal. She exhibits no distension. There is no tenderness. There is no rebound and no guarding.  Musculoskeletal: Normal range of motion. She exhibits no edema.  Neurological: She is alert and oriented to person, place, and time. No cranial nerve deficit. She exhibits normal muscle tone. Coordination normal.  Skin: Skin is warm. No rash noted. No erythema.  Psychiatric: She has a normal mood and affect.    ED Course  Procedures (including critical care  time)  Date: 09/18/2011  Rate: 69  Rhythm: normal sinus rhythm  QRS Axis: normal  Intervals: normal  ST/T Wave abnormalities: normal  Conduction Disutrbances:none  Narrative Interpretation:   Old EKG Reviewed: none available   Labs Reviewed  BASIC METABOLIC PANEL - Abnormal; Notable for the following:    Glucose, Bld 109 (*)    GFR calc non Af Amer 87 (*)    All other components within normal limits  CBC  DIFFERENTIAL  POCT I-STAT TROPONIN I  POCT I-STAT TROPONIN I  I-STAT TROPONIN I  I-STAT TROPONIN I   Dg Chest 2 View  09/18/2011  *RADIOLOGY REPORT*  Clinical Data: Chest pain.  History of hypertension.  CHEST - 2 VIEW 09/18/2011:  Comparison: Two-view chest x-ray 12/19/2007.  CTA chest 01/04/2005. Portable chest x-ray 01/04/2005.  Prior imaging at [facility].  Findings: Cardiac silhouette upper normal in size, unchanged. Thoracic aorta mildly tortuous and atherosclerotic.  Hilar and mediastinal contours otherwise unremarkable.  Minimal linear scarring in the lingula.  Lungs otherwise clear.  No pleural effusions.  Visualized bony thorax intact.  IMPRESSION: Minimal lingular scar.  No acute cardiopulmonary disease.  Original Report Authenticated By: Arnell Sieving, M.D.     1. Chest pain   2. Pharyngitis       MDM  The patient was admitted and evaluated by myself. Patient had not received aspirin prior to arrival and was given this. This helped with her headache. Patient was worked up for her chest pain. She had 0 and 3 hour troponins which were negative. CBC and renal panel were unremarkable and patient also had an EKG no significant findings. Patient had no chest pain prior to discharge. Patient was initially somewhat hypertensive but this resolved. Patient can followup with her primary care physician regarding this. Patient was also fairly upset when she initially arrived as well. As she calmed down her blood pressure improved. Patient will followup with her  primary care provider regarding her blood pressure. This was down to 150s over 80s prior to discharge. Her sore throat showed no signs of exit eights. Patient can continue to treat this as a viral pharyngitis. Patient was comfortable with this and was instructed on saltwater gargles. She was discharged home in good condition.       Cyndra Numbers, MD 09/18/11 2232

## 2012-09-03 ENCOUNTER — Other Ambulatory Visit: Payer: Self-pay | Admitting: Family Medicine

## 2012-09-03 DIAGNOSIS — Z1231 Encounter for screening mammogram for malignant neoplasm of breast: Secondary | ICD-10-CM

## 2012-09-05 ENCOUNTER — Ambulatory Visit
Admission: RE | Admit: 2012-09-05 | Discharge: 2012-09-05 | Disposition: A | Discharge status: Home / Self Care | Payer: Medicare Other | Source: Ambulatory Visit | Attending: Family Medicine | Admitting: Family Medicine

## 2012-09-05 DIAGNOSIS — Z1231 Encounter for screening mammogram for malignant neoplasm of breast: Secondary | ICD-10-CM

## 2013-01-02 ENCOUNTER — Encounter: Payer: Self-pay | Admitting: *Deleted

## 2013-10-16 ENCOUNTER — Other Ambulatory Visit: Payer: Self-pay

## 2013-10-16 DIAGNOSIS — Z1231 Encounter for screening mammogram for malignant neoplasm of breast: Secondary | ICD-10-CM

## 2013-10-17 ENCOUNTER — Ambulatory Visit
Admission: RE | Admit: 2013-10-17 | Discharge: 2013-10-17 | Disposition: A | Discharge status: Home / Self Care | Payer: Medicare Other | Source: Ambulatory Visit

## 2013-10-17 DIAGNOSIS — Z1231 Encounter for screening mammogram for malignant neoplasm of breast: Secondary | ICD-10-CM

## 2014-01-10 ENCOUNTER — Other Ambulatory Visit: Payer: Self-pay | Admitting: Family Medicine

## 2014-03-04 ENCOUNTER — Encounter: Payer: Medicare Other | Attending: Family Medicine

## 2014-03-04 VITALS — Ht 68.0 in | Wt 244.5 lb

## 2014-03-04 DIAGNOSIS — Z713 Dietary counseling and surveillance: Secondary | ICD-10-CM | POA: Insufficient documentation

## 2014-03-04 DIAGNOSIS — E119 Type 2 diabetes mellitus without complications: Secondary | ICD-10-CM | POA: Insufficient documentation

## 2014-03-05 NOTE — Progress Notes (Signed)
Patient was seen on 03/04/14 for the first of a series of three diabetes self-management courses at [facility].  Current HbA1c: 7.2%  The following learning objectives were met by the patient during this class:  Describe diabetes  State some common risk factors for diabetes  Defines the role of glucose and insulin  Identifies type of diabetes and pathophysiology  Describe the relationship between diabetes and cardiovascular risk  State the members of the Healthcare Team  States the rationale for glucose monitoring  State when to test glucose  State their individual Target Range  State the importance of logging glucose readings  Describe how to interpret glucose readings  Identifies A1C target  Explain the correlation between A1c and eAG values  State symptoms and treatment of high blood glucose  State symptoms and treatment of low blood glucose  Explain proper technique for glucose testing  Identifies proper sharps disposal  Handouts given during class include:  Living Well with Diabetes book  Carb Counting and Meal Planning book  Meal Plan Card  Carbohydrate guide  Meal planning worksheet  Low Sodium Flavoring Tips  The diabetes portion plate  L7B to eAG Conversion Chart  Diabetes Medications  Diabetes Recommended Care Schedule  Support Group  Diabetes Success Plan  Core Class Satisfaction Survey  Follow-Up Plan:  Attend core 2

## 2014-03-11 DIAGNOSIS — E119 Type 2 diabetes mellitus without complications: Secondary | ICD-10-CM

## 2014-03-12 NOTE — Progress Notes (Signed)

## 2014-03-18 DIAGNOSIS — E119 Type 2 diabetes mellitus without complications: Secondary | ICD-10-CM

## 2014-03-18 NOTE — Progress Notes (Signed)
Patient was seen on 03/18/14 for the third of a series of three diabetes self-management courses at the [facility]. The following learning objectives were met by the patient during this class:    State the amount of activity recommended for healthy living   Describe activities suitable for individual needs   Identify ways to regularly incorporate activity into daily life   Identify barriers to activity and ways to over come these barriers  Identify diabetes medications being personally used and their primary action for lowering glucose and possible side effects   Describe role of stress on blood glucose and develop strategies to address psychosocial issues   Identify diabetes complications and ways to prevent them  Explain how to manage diabetes during illness   Evaluate success in meeting personal goal   Establish 2-3 goals that they will plan to diligently work on until they return for the  64-monthfollow-up visit  Goals:  Follow Diabetes Meal Plan as instructed  Aim for 15-30 mins of physical activity daily as tolerated  Bring food record and glucose log to your follow up visit  Your patient has established the following 4 month goals in their individualized success plan: I will increase my activity level  I will test my glucose   Your patient has identified these potential barriers to change:  None stated  Your patient has identified their diabetes self-care support plan as  Family support  Plan:  Attend Core 4 in 4 months

## 2014-05-29 DIAGNOSIS — E1140 Type 2 diabetes mellitus with diabetic neuropathy, unspecified: Secondary | ICD-10-CM | POA: Insufficient documentation

## 2014-07-09 ENCOUNTER — Emergency Department (HOSPITAL_BASED_OUTPATIENT_CLINIC_OR_DEPARTMENT_OTHER): Payer: Medicare Other

## 2014-07-09 ENCOUNTER — Emergency Department (HOSPITAL_BASED_OUTPATIENT_CLINIC_OR_DEPARTMENT_OTHER)
Admission: EM | Admit: 2014-07-09 | Discharge: 2014-07-10 | Disposition: A | Discharge status: Home / Self Care | Payer: Medicare Other | Attending: Emergency Medicine | Admitting: Emergency Medicine

## 2014-07-09 ENCOUNTER — Encounter (HOSPITAL_BASED_OUTPATIENT_CLINIC_OR_DEPARTMENT_OTHER): Payer: Self-pay | Admitting: Emergency Medicine

## 2014-07-09 DIAGNOSIS — E669 Obesity, unspecified: Secondary | ICD-10-CM | POA: Diagnosis not present

## 2014-07-09 DIAGNOSIS — Z79899 Other long term (current) drug therapy: Secondary | ICD-10-CM | POA: Insufficient documentation

## 2014-07-09 DIAGNOSIS — Z87448 Personal history of other diseases of urinary system: Secondary | ICD-10-CM | POA: Insufficient documentation

## 2014-07-09 DIAGNOSIS — Y9289 Other specified places as the place of occurrence of the external cause: Secondary | ICD-10-CM | POA: Diagnosis not present

## 2014-07-09 DIAGNOSIS — Z9889 Other specified postprocedural states: Secondary | ICD-10-CM | POA: Diagnosis not present

## 2014-07-09 DIAGNOSIS — S298XXA Other specified injuries of thorax, initial encounter: Secondary | ICD-10-CM | POA: Insufficient documentation

## 2014-07-09 DIAGNOSIS — S2239XA Fracture of one rib, unspecified side, initial encounter for closed fracture: Secondary | ICD-10-CM | POA: Diagnosis not present

## 2014-07-09 DIAGNOSIS — F3289 Other specified depressive episodes: Secondary | ICD-10-CM | POA: Diagnosis not present

## 2014-07-09 DIAGNOSIS — Y9389 Activity, other specified: Secondary | ICD-10-CM | POA: Diagnosis not present

## 2014-07-09 DIAGNOSIS — I209 Angina pectoris, unspecified: Secondary | ICD-10-CM | POA: Diagnosis not present

## 2014-07-09 DIAGNOSIS — S2231XA Fracture of one rib, right side, initial encounter for closed fracture: Secondary | ICD-10-CM

## 2014-07-09 DIAGNOSIS — F329 Major depressive disorder, single episode, unspecified: Secondary | ICD-10-CM | POA: Diagnosis not present

## 2014-07-09 DIAGNOSIS — I10 Essential (primary) hypertension: Secondary | ICD-10-CM | POA: Insufficient documentation

## 2014-07-09 DIAGNOSIS — E785 Hyperlipidemia, unspecified: Secondary | ICD-10-CM | POA: Diagnosis not present

## 2014-07-09 DIAGNOSIS — E119 Type 2 diabetes mellitus without complications: Secondary | ICD-10-CM | POA: Insufficient documentation

## 2014-07-09 DIAGNOSIS — W1809XA Striking against other object with subsequent fall, initial encounter: Secondary | ICD-10-CM | POA: Insufficient documentation

## 2014-07-09 NOTE — ED Provider Notes (Signed)
CSN: [id number]     Arrival date & time 07/09/14  2129 History   This chart was scribed for Wynetta Fines, MD by Jeanell Sparrow, ED Scribe. This patient was seen in room MH01/MH01 and the patient's care was started at 11:23 PM.  Chief Complaint  Patient presents with  . Chest Injury   The history is provided by the patient. No language interpreter was used.   HPI Comments: [name] is a 68 y.o. female who presents to the Emergency Department complaining of a chest injury that occurred about 5 days ago. She states that a board that she was on gave way and she landed on her right chest. She reports that the pain is worse at night. She rates it an 8/10. She states that the pain is exacerbated by lying on her back or side. She states that it hurts to cough. She states that she took hydrocodone with some relief.   Past Medical History  Diagnosis Date  . Hypertension   . Kidney tumor   . Anginal pain   . Diabetes mellitus without complication   . Depression   . Hyperlipidemia   . Obesity    Past Surgical History  Procedure Laterality Date  . Tonsillectomy    . Hernia repair    . Embolization    . Cardiac catheterization     Family History  Problem Relation Age of Onset  . Diabetes Mother    History  Substance Use Topics  . Smoking status: Never Smoker   . Smokeless tobacco: Not on file  . Alcohol Use: No   OB History   Grav Para Term Preterm Abortions TAB SAB Ect Mult Living                 Review of Systems A complete 10 system review of systems was obtained and all systems are negative except as noted in the HPI and PMH.   Allergies  Review of patient's allergies indicates no known allergies.  Home Medications   Prior to Admission medications   Medication Sig Start Date End Date Taking? Authorizing Provider  acyclovir (ZOVIRAX) 400 MG tablet Take 400 mg by mouth 3 (three) times daily.    Historical Provider, MD  amLODipine (NORVASC) 5 MG tablet Take 5 mg by mouth  daily.    Historical Provider, MD  atorvastatin (LIPITOR) 20 MG tablet Take 20 mg by mouth daily.    Historical Provider, MD  lisinopril (PRINIVIL,ZESTRIL) 40 MG tablet Take 40 mg by mouth daily.    Historical Provider, MD  metFORMIN (GLUCOPHAGE) 500 MG tablet Take 500 mg by mouth daily with breakfast.    Historical Provider, MD  nitroGLYCERIN (NITROSTAT) 0.4 MG SL tablet Place 0.4 mg under the tongue every 5 (five) minutes as needed. Pt states continues to take PRN, but rx is out dated.     Historical Provider, MD  traZODone (DESYREL) 50 MG tablet Take 50 mg by mouth at bedtime.    Historical Provider, MD   BP 181/74  Pulse 86  Temp(Src) 98.1 F (36.7 C) (Oral)  Resp 16  Ht 5\' 8"  (1.727 m)  Wt 239 lb (108.41 kg)  BMI 36.35 kg/m2  SpO2 100% Physical Exam  Nursing note and vitals reviewed.  General: Well-developed, well-nourished female in no acute distress; appearance consistent with age of record HENT: normocephalic; atraumatic Eyes: pupils equal, round and reactive to light; extraocular muscles intact Neck: supple Heart: regular rate and rhythm; no murmurs, rubs or  gallops Lungs: clear to auscultation bilaterally Abdomen: soft; nondistended; nontender; no masses or hepatosplenomegaly; bowel sounds present Extremities: No deformity; full range of motion; pulses normal Neurologic: Awake, alert and oriented; motor function intact in all extremities and symmetric; no facial droop Skin: Warm and dry Psychiatric: Normal mood and affect Chest: Rib tenderness of right inframammary fold extending laterally. No crepitus.   ED Course  Procedures (including critical care time) DIAGNOSTIC STUDIES: Oxygen Saturation is 100% on RA, normal by my interpretation.    COORDINATION OF CARE: 11:27 PM- Pt advised of plan for treatment which includes radiology and pt agrees.   MDM  Nursing notes and vitals signs, including pulse oximetry, reviewed.  Summary of this visit's results, reviewed by  myself:  Imaging Studies: Dg Chest 2 View  07/09/2014   CLINICAL DATA:  Right-sided chest pain after a fall 1 week ago.  EXAM: CHEST  2 VIEW  COMPARISON:  09/18/2011  FINDINGS: Normal heart size and pulmonary vascularity. Small focal linear scar again demonstrated in the left mid lung. No focal airspace disease or consolidation in the lungs. No blunting of costophrenic angles. No pneumothorax. Mediastinal contours appear intact.  IMPRESSION: No active cardiopulmonary disease.   Electronically Signed   By: Lucienne Capers M.D.   On: 07/09/2014 22:12   Dg Ribs Unilateral Right  07/10/2014   CLINICAL DATA:  Fall, pain under right breast  EXAM: RIGHT RIBS - 2 VIEW  COMPARISON:  None.  FINDINGS: Lung bases are clear.  The heart is normal in size.  Suspected nondisplaced right lateral 6th rib fracture.  Cholecystectomy clips.  IMPRESSION: Suspected nondisplaced right lateral 6th rib fracture.   Electronically Signed   By: Julian Hy M.D.   On: 07/10/2014 00:17    I personally performed the services described in this documentation, which was scribed in my presence. The recorded information has been reviewed and is accurate.   Wynetta Fines, MD 07/10/14 [phone]

## 2014-07-09 NOTE — ED Notes (Signed)
Pt fell onto wooden part of water bed 5 days ago injuring right breast and right rib area.  Sts the pain is getting worse and it is difficult to breathe now.

## 2014-07-10 MED ORDER — OXYCODONE HCL ER 10 MG PO T12A: 10.0000 mg | EXTENDED_RELEASE_TABLET | Freq: Two times a day (BID) | ORAL | Status: DC

## 2014-07-10 NOTE — Discharge Instructions (Signed)

## 2014-08-04 ENCOUNTER — Other Ambulatory Visit: Payer: Self-pay | Admitting: Family Medicine

## 2014-08-04 ENCOUNTER — Ambulatory Visit: Payer: Medicare Other

## 2014-08-04 DIAGNOSIS — Z13820 Encounter for screening for osteoporosis: Secondary | ICD-10-CM

## 2014-10-31 HISTORY — PX: COLONOSCOPY: SHX174

## 2014-12-11 ENCOUNTER — Other Ambulatory Visit: Payer: Self-pay

## 2014-12-11 DIAGNOSIS — Z1231 Encounter for screening mammogram for malignant neoplasm of breast: Secondary | ICD-10-CM

## 2014-12-22 ENCOUNTER — Ambulatory Visit
Admission: RE | Admit: 2014-12-22 | Discharge: 2014-12-22 | Disposition: A | Discharge status: Home / Self Care | Payer: Medicare Other | Source: Ambulatory Visit

## 2014-12-22 DIAGNOSIS — Z1231 Encounter for screening mammogram for malignant neoplasm of breast: Secondary | ICD-10-CM

## 2015-04-10 DIAGNOSIS — S50861A Insect bite (nonvenomous) of right forearm, initial encounter: Secondary | ICD-10-CM | POA: Diagnosis not present

## 2015-04-21 DIAGNOSIS — I10 Essential (primary) hypertension: Secondary | ICD-10-CM | POA: Diagnosis not present

## 2015-04-21 DIAGNOSIS — Z6836 Body mass index (BMI) 36.0-36.9, adult: Secondary | ICD-10-CM | POA: Diagnosis not present

## 2015-04-21 DIAGNOSIS — M1991 Primary osteoarthritis, unspecified site: Secondary | ICD-10-CM | POA: Diagnosis not present

## 2015-05-12 DIAGNOSIS — I10 Essential (primary) hypertension: Secondary | ICD-10-CM | POA: Diagnosis not present

## 2015-05-12 DIAGNOSIS — E781 Pure hyperglyceridemia: Secondary | ICD-10-CM | POA: Diagnosis not present

## 2015-06-25 DIAGNOSIS — H9313 Tinnitus, bilateral: Secondary | ICD-10-CM | POA: Diagnosis not present

## 2015-06-25 DIAGNOSIS — I10 Essential (primary) hypertension: Secondary | ICD-10-CM | POA: Diagnosis not present

## 2015-06-25 DIAGNOSIS — Z6834 Body mass index (BMI) 34.0-34.9, adult: Secondary | ICD-10-CM | POA: Diagnosis not present

## 2015-06-25 DIAGNOSIS — N390 Urinary tract infection, site not specified: Secondary | ICD-10-CM | POA: Diagnosis not present

## 2015-06-25 DIAGNOSIS — H5712 Ocular pain, left eye: Secondary | ICD-10-CM | POA: Diagnosis not present

## 2015-06-25 DIAGNOSIS — Z1389 Encounter for screening for other disorder: Secondary | ICD-10-CM | POA: Diagnosis not present

## 2015-07-21 DIAGNOSIS — H3530 Unspecified macular degeneration: Secondary | ICD-10-CM | POA: Diagnosis not present

## 2015-09-14 DIAGNOSIS — M1991 Primary osteoarthritis, unspecified site: Secondary | ICD-10-CM | POA: Diagnosis not present

## 2015-09-14 DIAGNOSIS — E6609 Other obesity due to excess calories: Secondary | ICD-10-CM | POA: Diagnosis not present

## 2015-09-14 DIAGNOSIS — K047 Periapical abscess without sinus: Secondary | ICD-10-CM | POA: Diagnosis not present

## 2015-09-14 DIAGNOSIS — Z6831 Body mass index (BMI) 31.0-31.9, adult: Secondary | ICD-10-CM | POA: Diagnosis not present

## 2015-09-14 DIAGNOSIS — Z1389 Encounter for screening for other disorder: Secondary | ICD-10-CM | POA: Diagnosis not present

## 2015-09-14 DIAGNOSIS — G894 Chronic pain syndrome: Secondary | ICD-10-CM | POA: Diagnosis not present

## 2015-11-11 ENCOUNTER — Emergency Department (HOSPITAL_COMMUNITY): Payer: Medicare Other

## 2015-11-11 ENCOUNTER — Observation Stay (HOSPITAL_COMMUNITY)
Admission: EM | Admit: 2015-11-11 | Discharge: 2015-11-14 | Disposition: A | Discharge status: Home / Self Care | Payer: Medicare Other | Attending: Internal Medicine | Admitting: Internal Medicine

## 2015-11-11 ENCOUNTER — Encounter (HOSPITAL_COMMUNITY): Payer: Self-pay

## 2015-11-11 DIAGNOSIS — E1159 Type 2 diabetes mellitus with other circulatory complications: Secondary | ICD-10-CM

## 2015-11-11 DIAGNOSIS — Z7984 Long term (current) use of oral hypoglycemic drugs: Secondary | ICD-10-CM | POA: Insufficient documentation

## 2015-11-11 DIAGNOSIS — D1809 Hemangioma of other sites: Secondary | ICD-10-CM | POA: Diagnosis not present

## 2015-11-11 DIAGNOSIS — K5730 Diverticulosis of large intestine without perforation or abscess without bleeding: Secondary | ICD-10-CM | POA: Insufficient documentation

## 2015-11-11 DIAGNOSIS — E119 Type 2 diabetes mellitus without complications: Secondary | ICD-10-CM

## 2015-11-11 DIAGNOSIS — R109 Unspecified abdominal pain: Secondary | ICD-10-CM

## 2015-11-11 DIAGNOSIS — F329 Major depressive disorder, single episode, unspecified: Secondary | ICD-10-CM | POA: Insufficient documentation

## 2015-11-11 DIAGNOSIS — Z6829 Body mass index (BMI) 29.0-29.9, adult: Secondary | ICD-10-CM | POA: Insufficient documentation

## 2015-11-11 DIAGNOSIS — E785 Hyperlipidemia, unspecified: Secondary | ICD-10-CM | POA: Insufficient documentation

## 2015-11-11 DIAGNOSIS — K760 Fatty (change of) liver, not elsewhere classified: Secondary | ICD-10-CM | POA: Insufficient documentation

## 2015-11-11 DIAGNOSIS — Z9049 Acquired absence of other specified parts of digestive tract: Secondary | ICD-10-CM | POA: Insufficient documentation

## 2015-11-11 DIAGNOSIS — D3501 Benign neoplasm of right adrenal gland: Secondary | ICD-10-CM | POA: Insufficient documentation

## 2015-11-11 DIAGNOSIS — I10 Essential (primary) hypertension: Secondary | ICD-10-CM | POA: Diagnosis not present

## 2015-11-11 DIAGNOSIS — R1030 Lower abdominal pain, unspecified: Secondary | ICD-10-CM

## 2015-11-11 DIAGNOSIS — E669 Obesity, unspecified: Secondary | ICD-10-CM | POA: Diagnosis not present

## 2015-11-11 DIAGNOSIS — K529 Noninfective gastroenteritis and colitis, unspecified: Secondary | ICD-10-CM | POA: Diagnosis not present

## 2015-11-11 DIAGNOSIS — M7989 Other specified soft tissue disorders: Secondary | ICD-10-CM | POA: Insufficient documentation

## 2015-11-11 DIAGNOSIS — I700 Atherosclerosis of aorta: Secondary | ICD-10-CM | POA: Insufficient documentation

## 2015-11-11 LAB — URINE MICROSCOPIC-ADD ON: RBC / HPF: NONE SEEN RBC/hpf (ref 0–5)

## 2015-11-11 LAB — CBC WITH DIFFERENTIAL/PLATELET
Basophils Absolute: 0 10*3/uL (ref 0.0–0.1)
Basophils Relative: 0 %
Eosinophils Absolute: 0.1 10*3/uL (ref 0.0–0.7)
Eosinophils Relative: 1 %
HCT: 43.3 % (ref 36.0–46.0)
Hemoglobin: 14.7 g/dL (ref 12.0–15.0)
Lymphocytes Relative: 18 %
Lymphs Abs: 2.1 10*3/uL (ref 0.7–4.0)
MCH: 30.2 pg (ref 26.0–34.0)
MCHC: 33.9 g/dL (ref 30.0–36.0)
MCV: 89.1 fL (ref 78.0–100.0)
Monocytes Absolute: 0.9 10*3/uL (ref 0.1–1.0)
Monocytes Relative: 8 %
Neutro Abs: 8.8 10*3/uL — ABNORMAL HIGH (ref 1.7–7.7)
Neutrophils Relative %: 73 %
Platelets: 261 10*3/uL (ref 150–400)
RBC: 4.86 MIL/uL (ref 3.87–5.11)
RDW: 12.9 % (ref 11.5–15.5)
WBC: 11.9 10*3/uL — ABNORMAL HIGH (ref 4.0–10.5)

## 2015-11-11 LAB — COMPREHENSIVE METABOLIC PANEL
ALT: 14 U/L (ref 14–54)
AST: 18 U/L (ref 15–41)
Albumin: 4.2 g/dL (ref 3.5–5.0)
Alkaline Phosphatase: 80 U/L (ref 38–126)
Anion gap: 12 (ref 5–15)
BUN: 14 mg/dL (ref 6–20)
CO2: 26 mmol/L (ref 22–32)
Calcium: 9.4 mg/dL (ref 8.9–10.3)
Chloride: 106 mmol/L (ref 101–111)
Creatinine, Ser: 1.1 mg/dL — ABNORMAL HIGH (ref 0.44–1.00)
GFR calc Af Amer: 57 mL/min — ABNORMAL LOW (ref >60)
GFR calc non Af Amer: 49 mL/min — ABNORMAL LOW (ref >60)
Glucose, Bld: 147 mg/dL — ABNORMAL HIGH (ref 65–99)
Potassium: 3.4 mmol/L — ABNORMAL LOW (ref 3.5–5.1)
Sodium: 144 mmol/L (ref 135–145)
Total Bilirubin: 0.8 mg/dL (ref 0.3–1.2)
Total Protein: 7.1 g/dL (ref 6.5–8.1)

## 2015-11-11 LAB — URINALYSIS, ROUTINE W REFLEX MICROSCOPIC
Bilirubin Urine: NEGATIVE
Glucose, UA: NEGATIVE mg/dL
Hgb urine dipstick: NEGATIVE
Ketones, ur: 15 mg/dL — AB
Nitrite: NEGATIVE
Protein, ur: NEGATIVE mg/dL
Specific Gravity, Urine: 1.01 (ref 1.005–1.030)
pH: 7.5 (ref 5.0–8.0)

## 2015-11-11 LAB — LIPASE, BLOOD: Lipase: 16 U/L (ref 11–51)

## 2015-11-11 LAB — GLUCOSE, CAPILLARY: Glucose-Capillary: 140 mg/dL — ABNORMAL HIGH (ref 65–99)

## 2015-11-11 MED ORDER — HYDROMORPHONE HCL 1 MG/ML IJ SOLN
1.0000 mg | Freq: Once | INTRAMUSCULAR | Status: AC
  Administered 2015-11-11: 1 mg via INTRAVENOUS
  Filled 2015-11-11: qty 1

## 2015-11-11 MED ORDER — ONDANSETRON HCL 4 MG/2ML IJ SOLN
4.0000 mg | Freq: Once | INTRAMUSCULAR | Status: AC
  Administered 2015-11-11: 4 mg via INTRAVENOUS
  Filled 2015-11-11: qty 2

## 2015-11-11 MED ORDER — IOHEXOL 300 MG/ML  SOLN
100.0000 mL | Freq: Once | INTRAMUSCULAR | Status: AC | PRN
  Administered 2015-11-11: 100 mL via INTRAVENOUS

## 2015-11-11 MED ORDER — LISINOPRIL 10 MG PO TABS
40.0000 mg | ORAL_TABLET | Freq: Every day | ORAL | Status: DC
  Administered 2015-11-12 – 2015-11-14 (×4): 40 mg via ORAL
  Filled 2015-11-11: qty 4
  Filled 2015-11-11: qty 8
  Filled 2015-11-11 (×3): qty 4

## 2015-11-11 MED ORDER — ENOXAPARIN SODIUM 40 MG/0.4ML ~~LOC~~ SOLN
40.0000 mg | SUBCUTANEOUS | Status: DC
  Administered 2015-11-11 – 2015-11-13 (×3): 40 mg via SUBCUTANEOUS
  Filled 2015-11-11 (×3): qty 0.4

## 2015-11-11 MED ORDER — CIPROFLOXACIN IN D5W 400 MG/200ML IV SOLN: 400.0000 mg | Freq: Two times a day (BID) | INTRAVENOUS | Status: DC

## 2015-11-11 MED ORDER — METRONIDAZOLE IN NACL 5-0.79 MG/ML-% IV SOLN
500.0000 mg | Freq: Three times a day (TID) | INTRAVENOUS | Status: DC
  Administered 2015-11-12 – 2015-11-13 (×4): 500 mg via INTRAVENOUS
  Filled 2015-11-11 (×4): qty 100

## 2015-11-11 MED ORDER — POTASSIUM CHLORIDE IN NACL 20-0.9 MEQ/L-% IV SOLN
INTRAVENOUS | Status: DC
Start: 2015-11-11 — End: 2015-11-14
  Administered 2015-11-11 – 2015-11-14 (×5): via INTRAVENOUS

## 2015-11-11 MED ORDER — HYDROMORPHONE HCL 1 MG/ML IJ SOLN: 1.0000 mg | Freq: Once | INTRAMUSCULAR | Status: DC

## 2015-11-11 MED ORDER — METFORMIN HCL ER 500 MG PO TB24
500.0000 mg | ORAL_TABLET | Freq: Every day | ORAL | Status: DC
  Administered 2015-11-14: 500 mg via ORAL
  Filled 2015-11-11: qty 1

## 2015-11-11 MED ORDER — ONDANSETRON HCL 4 MG/2ML IJ SOLN
4.0000 mg | Freq: Four times a day (QID) | INTRAMUSCULAR | Status: DC | PRN
  Administered 2015-11-11 – 2015-11-13 (×4): 4 mg via INTRAVENOUS
  Filled 2015-11-11 (×4): qty 2

## 2015-11-11 MED ORDER — METRONIDAZOLE IN NACL 5-0.79 MG/ML-% IV SOLN
500.0000 mg | Freq: Once | INTRAVENOUS | Status: AC
  Administered 2015-11-11: 500 mg via INTRAVENOUS
  Filled 2015-11-11: qty 100

## 2015-11-11 MED ORDER — DICYCLOMINE HCL 10 MG/ML IM SOLN
20.0000 mg | Freq: Once | INTRAMUSCULAR | Status: AC
  Administered 2015-11-11: 20 mg via INTRAMUSCULAR
  Filled 2015-11-11: qty 2

## 2015-11-11 MED ORDER — MORPHINE SULFATE (PF) 2 MG/ML IV SOLN
2.0000 mg | INTRAVENOUS | Status: DC | PRN
  Administered 2015-11-12 – 2015-11-13 (×2): 2 mg via INTRAVENOUS
  Filled 2015-11-11 (×2): qty 1

## 2015-11-11 MED ORDER — CIPROFLOXACIN IN D5W 400 MG/200ML IV SOLN
400.0000 mg | Freq: Two times a day (BID) | INTRAVENOUS | Status: DC
  Administered 2015-11-12 – 2015-11-13 (×3): 400 mg via INTRAVENOUS
  Filled 2015-11-11 (×4): qty 200

## 2015-11-11 MED ORDER — INSULIN ASPART 100 UNIT/ML ~~LOC~~ SOLN: 0.0000 [IU] | Freq: Every day | SUBCUTANEOUS | Status: DC

## 2015-11-11 MED ORDER — ATORVASTATIN CALCIUM 20 MG PO TABS
20.0000 mg | ORAL_TABLET | Freq: Every day | ORAL | Status: DC
  Administered 2015-11-11 – 2015-11-13 (×3): 20 mg via ORAL
  Filled 2015-11-11 (×3): qty 1

## 2015-11-11 MED ORDER — METRONIDAZOLE IN NACL 5-0.79 MG/ML-% IV SOLN: 500.0000 mg | Freq: Three times a day (TID) | INTRAVENOUS | Status: DC

## 2015-11-11 MED ORDER — ONDANSETRON HCL 4 MG PO TABS: 4.0000 mg | ORAL_TABLET | Freq: Four times a day (QID) | ORAL | Status: DC | PRN

## 2015-11-11 MED ORDER — SODIUM CHLORIDE 0.9 % IV BOLUS (SEPSIS)
1000.0000 mL | Freq: Once | INTRAVENOUS | Status: AC
  Administered 2015-11-11: 1000 mL via INTRAVENOUS

## 2015-11-11 MED ORDER — ACYCLOVIR 200 MG PO CAPS
ORAL_CAPSULE | ORAL | Status: AC
  Filled 2015-11-11: qty 2

## 2015-11-11 MED ORDER — SODIUM CHLORIDE 0.9 % IV SOLN: INTRAVENOUS | Status: DC

## 2015-11-11 MED ORDER — NITROGLYCERIN 0.4 MG SL SUBL: 0.4000 mg | SUBLINGUAL_TABLET | SUBLINGUAL | Status: DC | PRN

## 2015-11-11 MED ORDER — DIATRIZOATE MEGLUMINE & SODIUM 66-10 % PO SOLN
ORAL | Status: AC
  Filled 2015-11-11: qty 30

## 2015-11-11 MED ORDER — AMLODIPINE BESYLATE 5 MG PO TABS
5.0000 mg | ORAL_TABLET | Freq: Every day | ORAL | Status: DC
  Administered 2015-11-11 – 2015-11-14 (×4): 5 mg via ORAL
  Filled 2015-11-11 (×4): qty 1

## 2015-11-11 MED ORDER — HYDROMORPHONE HCL 1 MG/ML IJ SOLN
1.0000 mg | INTRAMUSCULAR | Status: AC | PRN
  Administered 2015-11-12 (×5): 1 mg via INTRAVENOUS
  Filled 2015-11-11 (×5): qty 1

## 2015-11-11 MED ORDER — GI COCKTAIL ~~LOC~~: 30.0000 mL | Freq: Once | ORAL | Status: DC

## 2015-11-11 MED ORDER — ACYCLOVIR 400 MG PO TABS
400.0000 mg | ORAL_TABLET | Freq: Three times a day (TID) | ORAL | Status: DC
  Filled 2015-11-11 (×5): qty 1

## 2015-11-11 MED ORDER — TRAZODONE HCL 50 MG PO TABS
150.0000 mg | ORAL_TABLET | Freq: Every day | ORAL | Status: DC
  Administered 2015-11-11 – 2015-11-13 (×3): 150 mg via ORAL
  Filled 2015-11-11 (×4): qty 3

## 2015-11-11 MED ORDER — METFORMIN HCL ER 500 MG PO TB24: 500.0000 mg | ORAL_TABLET | Freq: Every day | ORAL | Status: DC

## 2015-11-11 MED ORDER — INSULIN ASPART 100 UNIT/ML ~~LOC~~ SOLN
0.0000 [IU] | Freq: Three times a day (TID) | SUBCUTANEOUS | Status: DC
  Administered 2015-11-12: 3 [IU] via SUBCUTANEOUS
  Administered 2015-11-13: 2 [IU] via SUBCUTANEOUS
  Administered 2015-11-14: 3 [IU] via SUBCUTANEOUS

## 2015-11-11 MED ORDER — CIPROFLOXACIN IN D5W 400 MG/200ML IV SOLN
400.0000 mg | Freq: Once | INTRAVENOUS | Status: AC
  Administered 2015-11-11: 400 mg via INTRAVENOUS
  Filled 2015-11-11: qty 200

## 2015-11-11 MED ORDER — HYDROCODONE-ACETAMINOPHEN 10-325 MG PO TABS
1.0000 | ORAL_TABLET | Freq: Four times a day (QID) | ORAL | Status: DC | PRN
  Administered 2015-11-12 – 2015-11-13 (×4): 1 via ORAL
  Filled 2015-11-11 (×5): qty 1

## 2015-11-11 MED ORDER — ONDANSETRON HCL 4 MG/2ML IJ SOLN: 4.0000 mg | Freq: Three times a day (TID) | INTRAMUSCULAR | Status: DC | PRN

## 2015-11-11 NOTE — H&P (Signed)
[facility] History and Physical  [name] [id number] DOB: [birth year] DOA: 11/11/2015  Referring physician: ER PCP: Florina Ou, MD   Chief Complaint: Abdominal pain  HPI: [name] is a 70 y.o. female  This is a 70 year old lady, diabetic, who presents with 4 day history of intermittent lower abdominal crampy pain with radiation diffusely across the lower abdomen. She has not opened her bowels in the last 3 days. She did pass some gas today. There is no vomiting. There is no fever. Evaluation in the emergency room with a CT abdominal scan shows a markedly abnormal scan with abnormality of the proximal and mid to distal ileum. Possibilities include inflammatory and/or infectious etiology for this abnormality. She is now being admitted for further investigation and management.   Review of Systems:  Apart from symptoms above, all systems are negative.  Past Medical History  Diagnosis Date  . Hypertension   . Kidney tumor   . Anginal pain ([place])   . Diabetes mellitus without complication ([place])   . Depression   . Hyperlipidemia   . Obesity    Past Surgical History  Procedure Laterality Date  . Tonsillectomy    . Hernia repair    . Embolization    . Cardiac catheterization     Social History:  reports that she has never smoked. She does not have any smokeless tobacco history on file. She reports that she does not drink alcohol or use illicit drugs.  No Known Allergies  Family History  Problem Relation Age of Onset  . Diabetes Mother       Prior to Admission medications   Medication Sig Start Date End Date Taking? Authorizing Provider  metFORMIN (GLUCOPHAGE-XR) 500 MG 24 hr tablet Take 500 mg by mouth daily with breakfast.   Yes Historical Provider, MD  phentermine (ADIPEX-P) 37.5 MG tablet Take 37.5 mg by mouth daily before breakfast.   Yes Historical Provider, MD  acyclovir (ZOVIRAX) 400 MG tablet Take 400 mg by mouth 3 (three) times daily.     Historical Provider, MD  amLODipine (NORVASC) 5 MG tablet Take 5 mg by mouth daily.    Historical Provider, MD  atorvastatin (LIPITOR) 20 MG tablet Take 20 mg by mouth daily.    Historical Provider, MD  HYDROcodone-acetaminophen (NORCO) 10-325 MG tablet Take 1 tablet by mouth every 6 (six) hours as needed. pain 11/01/15   Historical Provider, MD  lisinopril (PRINIVIL,ZESTRIL) 40 MG tablet Take 40 mg by mouth daily.    Historical Provider, MD  nitroGLYCERIN (NITROSTAT) 0.4 MG SL tablet Place 0.4 mg under the tongue every 5 (five) minutes as needed. Pt states continues to take PRN, but rx is out dated.     Historical Provider, MD  OxyCODONE (OXYCONTIN) 10 mg T12A 12 hr tablet Take 1 tablet (10 mg total) by mouth every 12 (twelve) hours. Patient not taking: Reported on 11/11/2015 07/10/14   Shanon Rosser, MD  traZODone (DESYREL) 150 MG tablet Take 150 mg by mouth at bedtime. 10/30/15   Historical Provider, MD   Physical Exam: Filed Vitals:   11/11/15 1930 11/11/15 2018 11/11/15 2030 11/11/15 2100  BP: 184/60 157/97 166/78 174/81  Pulse:  84    Temp:  97.5 F (36.4 C)    TempSrc:  Oral    Resp:  18 21 19   Height:      Weight:      SpO2:        Wt Readings from Last 3 Encounters:  11/11/15  88.451 kg (195 lb)  07/09/14 108.41 kg (239 lb)  03/05/14 110.904 kg (244 lb 8 oz)    General:  Appears calm and comfortable. She is afebrile. She is not toxic or septic clinically. Eyes: PERRL, normal lids, irises & conjunctiva ENT: grossly normal hearing, lips & tongue Neck: no LAD, masses or thyromegaly Cardiovascular: RRR, no m/r/g. No LE edema. Telemetry: SR, no arrhythmias  Respiratory: CTA bilaterally, no w/r/r. Normal respiratory effort. Abdomen: soft,tender in a generalized fashion on minimal palpation. Bowel sounds are scanty. There is no obvious rebound or guarding. Skin: no rash or induration seen on limited exam Musculoskeletal: grossly normal tone BUE/BLE Psychiatric: grossly normal mood  and affect, speech fluent and appropriate Neurologic: grossly non-focal.          Labs on Admission:  Basic Metabolic Panel:  Recent Labs Lab 11/11/15 1707  NA 144  K 3.4*  CL 106  CO2 26  GLUCOSE 147*  BUN 14  CREATININE 1.10*  CALCIUM 9.4   Liver Function Tests:  Recent Labs Lab 11/11/15 1707  AST 18  ALT 14  ALKPHOS 80  BILITOT 0.8  PROT 7.1  ALBUMIN 4.2    Recent Labs Lab 11/11/15 1707  LIPASE 16   No results for input(s): AMMONIA in the last 168 hours. CBC:  Recent Labs Lab 11/11/15 1707  WBC 11.9*  NEUTROABS 8.8*  HGB 14.7  HCT 43.3  MCV 89.1  PLT 261   Cardiac Enzymes: No results for input(s): CKTOTAL, CKMB, CKMBINDEX, TROPONINI in the last 168 hours.  BNP (last 3 results) No results for input(s): BNP in the last 8760 hours.  ProBNP (last 3 results) No results for input(s): PROBNP in the last 8760 hours.  CBG: No results for input(s): GLUCAP in the last 168 hours.  Radiological Exams on Admission: Ct Abdomen Pelvis W Contrast  11/11/2015  CLINICAL DATA:  Oral contrast per protocol; severe abdominal pain and spasms since 11/08/15; bloating; hx kidney tumor; diabetes-metformin sheet given. 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 EXAM: CT ABDOMEN AND PELVIS WITH CONTRAST TECHNIQUE: Multidetector CT imaging of the abdomen and pelvis was performed using the standard protocol following bolus administration of intravenous contrast. CONTRAST:  123mL OMNIPAQUE IOHEXOL 300 MG/ML  SOLN COMPARISON:  01/04/2005 ultrasound and report from multiple prior studies FINDINGS: Lower chest: No pulmonary nodules, pleural effusions, or infiltrates. Heart size is normal. No imaged pericardial effusion or significant coronary artery calcifications. Upper abdomen: Right benign adrenal adenoma is present. Status post cholecystectomy. There is focal fatty infiltration of the liver adjacent to  the falciform ligament. No focal liver lesion identified. Spleen has a normal appearance. There is a rim enhancing lesion involving the upper pole of the left kidney. Lesion is exophytic and measures 1.7 cm in diameter and water attenuation in density. There is a 1.5 cm fatty attenuation within the anterior upper pole of the left kidney, consistent with angiomyolipoma. There is a defect in the upper pole the left kidney, likely secondary to scarring. Coils are identified within the hilar portion of the upper pole of the left kidney following previous embolization of hemorrhagic left renal lesion. Low-attenuation lesions in the left kidney are favored to be cysts. There is no hydronephrosis or hydroureter. Gastrointestinal tract: Stomach has a normal appearance. There is dilatation of the proximal jejunal loops. The proximal and mid ileal loops are markedly abnormal, with wall thickening and mesenteric edema. The terminal ileal loops  are normal in caliber. There is no evidence for bowel obstruction. There is significant stool within the sigmoid colon. Scattered colonic diverticula are present. Pelvis: Uterus is absent. There is moderate free pelvic fluid. No adnexal mass. Retroperitoneum: Atherosclerotic calcification of the abdominal aorta. No retroperitoneal or mesenteric adenopathy. Abdominal wall: Supra umbilical surgical changes. Osseous structures: No suspicious lytic or blastic lesions are identified. L2 hemangioma noted. IMPRESSION: 1. Marked abnormality of the proximal and mid to distal ileum. Considerations include inflammatory and infectious abnormalities. 2. Rim enhancing lesion in the upper pole of the left kidney warrants further evaluation. Although this could represent a residual angiomyolipoma, the CT density measurements are indeterminate. Renal lesion protocol MRI is recommended. MRI should be performed when the patient is clinically stable and able to follow breath holding instructions (usually  best performed on an outpatient basis). 3. Benign right adrenal adenoma. 4. Hepatic steatosis. 5. Status post cholecystectomy. Electronically Signed   By: Nolon Nations M.D.   On: 11/11/2015 20:34    EKG: Independently reviewSinus rhythm without any acute ST-T wave changes.  Assessment/Plan   1. Abdominal pain. The etiology is not entirely clear to me. She does not appear to have bowel obstruction or perforated viscus on CT abdominal scan. She may have an ileitis and I will treat her empirically with intravenous antibiotics but I think this requires further investigation. I will ask gastroenterology to see the patient for any further recommendations. 2. Hypertension. Continue with home medications. 3. Diabetes. Continue with home medications and sliding scale of insulin. 4. Left kidney, upper pole lesion. This will require MRI scan when patient is more medically stable, possibly in the outpatient setting.  She will be admitted to the medical floor. Further recommendations will depend on patient's hospital progress.   Code Status: full code.   DVT Prophylaxis:Lovenox.   Family Communication: I discussed the plan with the patient at the bedside.    Disposition Plan: home when medically stable.    Time spent: 60 minutes.   Doree Albee [facility] Pager [phone].

## 2015-11-11 NOTE — ED Notes (Signed)
Pt reports severe abd spasms since Sunday.  Denies any v/d.   States pain would ease off then came back worse today.  Denies vaginal bleeding or discharge.  Pt says eating makes the pain worse   Pt also had tooth pulled this morning prior to coming to ER.  Pt says her pain is worse after eating anything.

## 2015-11-11 NOTE — ED Provider Notes (Signed)
CSN: [id number]     Arrival date & time 11/11/15  1613 History   First MD Initiated Contact with Patient 11/11/15 1640     Chief Complaint  Patient presents with  . Abdominal Pain     (Consider location/radiation/quality/duration/timing/severity/associated sxs/prior Treatment) HPI Patient presents with concern of abdominal pain. There is associated anorexia, nausea, but no vomiting.   Symptoms began about 4 days ago, since onset symptoms of been intermittent, occurring without clear precipitant. The pain is crampy, lower, With radiation diffusely across the lower abdomen. No clear alleviating factors. No vaginal bleeding, discharge. Patient has a notable history of prior kidney tumor, no history of bowel obstruction. Patient was well prior to the onset of symptoms. No recent medication changes, diet changes.  Past Medical History  Diagnosis Date  . Hypertension   . Kidney tumor   . Anginal pain ([place])   . Diabetes mellitus without complication ([place])   . Depression   . Hyperlipidemia   . Obesity    Past Surgical History  Procedure Laterality Date  . Tonsillectomy    . Hernia repair    . Embolization    . Cardiac catheterization     Family History  Problem Relation Age of Onset  . Diabetes Mother    Social History  Substance Use Topics  . Smoking status: Never Smoker   . Smokeless tobacco: None  . Alcohol Use: No   OB History    No data available     Review of Systems  Constitutional:       Per HPI, otherwise negative  HENT:       Per HPI, otherwise negative  Respiratory:       Per HPI, otherwise negative  Cardiovascular:       Per HPI, otherwise negative  Gastrointestinal: Positive for nausea and abdominal pain. Negative for vomiting.  Endocrine:       Negative aside from HPI  Genitourinary:       Neg aside from HPI   Musculoskeletal:       Per HPI, otherwise negative  Skin: Negative.   Neurological: Negative for syncope.      Allergies  Review  of patient's allergies indicates no known allergies.  Home Medications   Prior to Admission medications   Medication Sig Start Date End Date Taking? Authorizing Provider  phentermine (ADIPEX-P) 37.5 MG tablet Take 37.5 mg by mouth daily before breakfast.   Yes Historical Provider, MD  acyclovir (ZOVIRAX) 400 MG tablet Take 400 mg by mouth 3 (three) times daily.    Historical Provider, MD  amLODipine (NORVASC) 5 MG tablet Take 5 mg by mouth daily.    Historical Provider, MD  atorvastatin (LIPITOR) 20 MG tablet Take 20 mg by mouth daily.    Historical Provider, MD  HYDROcodone-acetaminophen (NORCO) 10-325 MG tablet Take 1 tablet by mouth every 6 (six) hours as needed. pain 11/01/15   Historical Provider, MD  lisinopril (PRINIVIL,ZESTRIL) 40 MG tablet Take 40 mg by mouth daily.    Historical Provider, MD  metFORMIN (GLUCOPHAGE) 500 MG tablet Take 500 mg by mouth daily with breakfast.    Historical Provider, MD  nitroGLYCERIN (NITROSTAT) 0.4 MG SL tablet Place 0.4 mg under the tongue every 5 (five) minutes as needed. Pt states continues to take PRN, but rx is out dated.     Historical Provider, MD  OxyCODONE (OXYCONTIN) 10 mg T12A 12 hr tablet Take 1 tablet (10 mg total) by mouth every 12 (twelve) hours. Patient not taking:  Reported on 11/11/2015 07/10/14   Shanon Rosser, MD  traZODone (DESYREL) 150 MG tablet Take 150 mg by mouth at bedtime. 10/30/15   Historical Provider, MD  traZODone (DESYREL) 50 MG tablet Take 50 mg by mouth at bedtime.    Historical Provider, MD   BP 156/85 mmHg  Pulse 94  Temp(Src) 98.3 F (36.8 C) (Temporal)  Resp 22  Ht 5' 8.5" (1.74 m)  Wt 195 lb (88.451 kg)  BMI 29.21 kg/m2  SpO2 100% Physical Exam  Constitutional: She is oriented to person, place, and time. She appears well-developed and well-nourished. No distress.  Uncomfortable appearing elderly female sitting upright in bed, speaking clearly, interacting appropriately.  HENT:  Head: Normocephalic and atraumatic.   Largely edentulous  Eyes: Conjunctivae and EOM are normal.  Cardiovascular: Normal rate and regular rhythm.   Pulmonary/Chest: Effort normal and breath sounds normal. No stridor. No respiratory distress.  Abdominal: She exhibits no distension. There is generalized tenderness. There is guarding. There is no rebound.  Musculoskeletal: She exhibits no edema.  Neurological: She is alert and oriented to person, place, and time. No cranial nerve deficit.  Skin: Skin is warm and dry.  Psychiatric: She has a normal mood and affect.  Nursing note and vitals reviewed.   ED Course  Procedures (including critical care time) Labs Review Labs Reviewed  CBC WITH DIFFERENTIAL/PLATELET - Abnormal; Notable for the following:    WBC 11.9 (*)    Neutro Abs 8.8 (*)    All other components within normal limits  COMPREHENSIVE METABOLIC PANEL - Abnormal; Notable for the following:    Potassium 3.4 (*)    Glucose, Bld 147 (*)    Creatinine, Ser 1.10 (*)    GFR calc non Af Amer 49 (*)    GFR calc Af Amer 57 (*)    All other components within normal limits  URINALYSIS, ROUTINE W REFLEX MICROSCOPIC (NOT AT [facility]) - Abnormal; Notable for the following:    Ketones, ur 15 (*)    Leukocytes, UA SMALL (*)    All other components within normal limits  URINE MICROSCOPIC-ADD ON - Abnormal; Notable for the following:    Squamous Epithelial / LPF 6-30 (*)    Bacteria, UA FEW (*)    All other components within normal limits  LIPASE, BLOOD    Imaging Review Ct Abdomen Pelvis W Contrast  11/11/2015  CLINICAL DATA:  Oral contrast per protocol; severe abdominal pain and spasms since 11/08/15; bloating; hx kidney tumor; diabetes-metformin sheet given. 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 EXAM: CT ABDOMEN AND PELVIS WITH CONTRAST TECHNIQUE: Multidetector CT imaging of the abdomen and pelvis was performed using the standard protocol  following bolus administration of intravenous contrast. CONTRAST:  13mL OMNIPAQUE IOHEXOL 300 MG/ML  SOLN COMPARISON:  01/04/2005 ultrasound and report from multiple prior studies FINDINGS: Lower chest: No pulmonary nodules, pleural effusions, or infiltrates. Heart size is normal. No imaged pericardial effusion or significant coronary artery calcifications. Upper abdomen: Right benign adrenal adenoma is present. Status post cholecystectomy. There is focal fatty infiltration of the liver adjacent to the falciform ligament. No focal liver lesion identified. Spleen has a normal appearance. There is a rim enhancing lesion involving the upper pole of the left kidney. Lesion is exophytic and measures 1.7 cm in diameter and water attenuation in density. There is a 1.5 cm fatty attenuation within the anterior upper pole of the left kidney, consistent with angiomyolipoma.  There is a defect in the upper pole the left kidney, likely secondary to scarring. Coils are identified within the hilar portion of the upper pole of the left kidney following previous embolization of hemorrhagic left renal lesion. Low-attenuation lesions in the left kidney are favored to be cysts. There is no hydronephrosis or hydroureter. Gastrointestinal tract: Stomach has a normal appearance. There is dilatation of the proximal jejunal loops. The proximal and mid ileal loops are markedly abnormal, with wall thickening and mesenteric edema. The terminal ileal loops are normal in caliber. There is no evidence for bowel obstruction. There is significant stool within the sigmoid colon. Scattered colonic diverticula are present. Pelvis: Uterus is absent. There is moderate free pelvic fluid. No adnexal mass. Retroperitoneum: Atherosclerotic calcification of the abdominal aorta. No retroperitoneal or mesenteric adenopathy. Abdominal wall: Supra umbilical surgical changes. Osseous structures: No suspicious lytic or blastic lesions are identified. L2 hemangioma  noted. IMPRESSION: 1. Marked abnormality of the proximal and mid to distal ileum. Considerations include inflammatory and infectious abnormalities. 2. Rim enhancing lesion in the upper pole of the left kidney warrants further evaluation. Although this could represent a residual angiomyolipoma, the CT density measurements are indeterminate. Renal lesion protocol MRI is recommended. MRI should be performed when the patient is clinically stable and able to follow breath holding instructions (usually best performed on an outpatient basis). 3. Benign right adrenal adenoma. 4. Hepatic steatosis. 5. Status post cholecystectomy. Electronically Signed   By: Nolon Nations M.D.   On: 11/11/2015 20:34   I have personally reviewed and evaluated these images and lab results as part of my medical decision-making.  8:45 PM Following multiple doses of analgesia, Dilaudid, Bentyl, Toradol, the patient has continued pain, though with less frequency, slightly less severity. We discussed the CT results, as well as lab results again. With concern for diffuse changes throughout the distal ileum patient started on antibiotics, continues to receive IV fluids, will be admitted.  MDM  Patient presents with abdominal pain. Notably, the patient has a history of prior renal malignancy, and other abdominal surgery, raising suspicion for bowel obstruction. Pulse ox not demonstrated on CT scan, though there is evidence for diffuse inflammatory or infectious changes in the distal ileum. Patient had some improvement here with fluids, analgesia, but required admission for further evaluation, management, IV antibiotics.   Carmin Muskrat, MD 11/11/15 2046

## 2015-11-11 NOTE — ED Notes (Signed)
Pt reports feeling abd bloating and pain moving up into chest. States chest filling up Very emotional and crying intermittently. Dr Vanita Panda notified. eKG completed

## 2015-11-11 NOTE — ED Notes (Signed)
Pt crying saying spasms in abdomen have returned. Dr Vanita Panda notified

## 2015-11-11 NOTE — ED Notes (Signed)
Pt continuing to complain of abd. Spasms. Dr Vanita Panda approved another dose of Bentyl

## 2015-11-12 ENCOUNTER — Telehealth: Payer: Self-pay | Admitting: Gastroenterology

## 2015-11-12 ENCOUNTER — Encounter (HOSPITAL_COMMUNITY): Payer: Self-pay | Admitting: Gastroenterology

## 2015-11-12 DIAGNOSIS — K529 Noninfective gastroenteritis and colitis, unspecified: Secondary | ICD-10-CM | POA: Insufficient documentation

## 2015-11-12 DIAGNOSIS — I10 Essential (primary) hypertension: Secondary | ICD-10-CM | POA: Diagnosis not present

## 2015-11-12 DIAGNOSIS — R1010 Upper abdominal pain, unspecified: Secondary | ICD-10-CM | POA: Diagnosis not present

## 2015-11-12 DIAGNOSIS — R1084 Generalized abdominal pain: Secondary | ICD-10-CM

## 2015-11-12 LAB — GLUCOSE, CAPILLARY
Glucose-Capillary: 120 mg/dL — ABNORMAL HIGH (ref 65–99)
Glucose-Capillary: 153 mg/dL — ABNORMAL HIGH (ref 65–99)
Glucose-Capillary: 103 mg/dL — ABNORMAL HIGH (ref 65–99)
Glucose-Capillary: 85 mg/dL (ref 65–99)

## 2015-11-12 LAB — CBC
HCT: 41.6 % (ref 36.0–46.0)
Hemoglobin: 13.4 g/dL (ref 12.0–15.0)
MCH: 29.6 pg (ref 26.0–34.0)
MCHC: 32.2 g/dL (ref 30.0–36.0)
MCV: 91.8 fL (ref 78.0–100.0)
Platelets: 243 10*3/uL (ref 150–400)
RBC: 4.53 MIL/uL (ref 3.87–5.11)
RDW: 13.3 % (ref 11.5–15.5)
WBC: 10.1 10*3/uL (ref 4.0–10.5)

## 2015-11-12 LAB — COMPREHENSIVE METABOLIC PANEL
ALT: 20 U/L (ref 14–54)
AST: 27 U/L (ref 15–41)
Albumin: 3.4 g/dL — ABNORMAL LOW (ref 3.5–5.0)
Alkaline Phosphatase: 74 U/L (ref 38–126)
Anion gap: 6 (ref 5–15)
BUN: 11 mg/dL (ref 6–20)
CO2: 29 mmol/L (ref 22–32)
Calcium: 8.2 mg/dL — ABNORMAL LOW (ref 8.9–10.3)
Chloride: 106 mmol/L (ref 101–111)
Creatinine, Ser: 0.82 mg/dL (ref 0.44–1.00)
GFR calc Af Amer: >60 mL/min (ref >60)
GFR calc non Af Amer: >60 mL/min (ref >60)
Glucose, Bld: 153 mg/dL — ABNORMAL HIGH (ref 65–99)
Potassium: 4.2 mmol/L (ref 3.5–5.1)
Sodium: 141 mmol/L (ref 135–145)
Total Bilirubin: 0.7 mg/dL (ref 0.3–1.2)
Total Protein: 5.8 g/dL — ABNORMAL LOW (ref 6.5–8.1)

## 2015-11-12 LAB — LACTIC ACID, PLASMA: Lactic Acid, Venous: 0.8 mmol/L (ref 0.5–2.0)

## 2015-11-12 MED ORDER — ACYCLOVIR 200 MG PO CAPS
400.0000 mg | ORAL_CAPSULE | Freq: Three times a day (TID) | ORAL | Status: DC
  Administered 2015-11-12 – 2015-11-14 (×8): 400 mg via ORAL
  Filled 2015-11-12 (×8): qty 2

## 2015-11-12 NOTE — Consult Note (Signed)
Referring Provider: Mikki Harbor* Primary Care Physician:  [facility] Primary Gastroenterologist:  Dr. Meriel Pica  Reason for Consultation:  Abdominal pain, abnormal CT  HPI: [name] is a 70 y.o. female admitted with four day history of crampy abdominal pain associated with nausea. Normal BM on Sunday. Early Monday morning woke up with severe "spasms" in the abdomen. Symptoms intermittent over the course of several days but became severe yesterday afternoon. Last BM Tuesday morning, "one teaspoon" worth. No melena, brbpr. Has tried pepto and Alka-seltzer with no relief. Presented to ER and had CT A/P with contrast last night showing proximal jejunal dilation, proximal and mid ileal loops markedly abnormal with wall thickening and mesenteric edema. TI normal. No evidence of bowel obstruction. Significant amount of stool in sigmoid colon. In between spasms, she states her abdomen is just sore. It is very tender to touch. No fever.  Since admission, her abdomen as become distended. She is belching and has the hiccups.   Patient notes at baseline she is very healthy. She has been losing weight over the past six months on phentermine, 49 pounds so far. Usually BMs regular. Last TCS with Dr. Collene Mares last year, she reports unremarkable. No heartburn. She has had vague stabbing like pain that comes and goes in the right abdomen at the level of the umbilicus.   Prior to Admission medications   Medication Sig Start Date End Date Taking? Authorizing Provider  metFORMIN (GLUCOPHAGE-XR) 500 MG 24 hr tablet Take 500 mg by mouth daily with breakfast.   Yes Historical Provider, MD  phentermine (ADIPEX-P) 37.5 MG tablet Take 37.5 mg by mouth daily before breakfast.   Yes Historical Provider, MD  acyclovir (ZOVIRAX) 400 MG tablet Take 400 mg by mouth 3 (three) times daily.    Historical Provider, MD  amLODipine (NORVASC) 5 MG tablet Take 5 mg by mouth daily.    Historical Provider,  MD  atorvastatin (LIPITOR) 20 MG tablet Take 20 mg by mouth daily.    Historical Provider, MD  HYDROcodone-acetaminophen (NORCO) 10-325 MG tablet Take 1 tablet by mouth every 6 (six) hours as needed. pain 11/01/15   Historical Provider, MD  lisinopril (PRINIVIL,ZESTRIL) 40 MG tablet Take 40 mg by mouth daily.    Historical Provider, MD  nitroGLYCERIN (NITROSTAT) 0.4 MG SL tablet Place 0.4 mg under the tongue every 5 (five) minutes as needed. Pt states continues to take PRN, but rx is out dated.     Historical Provider, MD          traZODone (DESYREL) 150 MG tablet Take 150 mg by mouth at bedtime. 10/30/15   Historical Provider, MD    Current Facility-Administered Medications  Medication Dose Route Frequency Provider Last Rate Last Dose  . 0.9 % NaCl with KCl 20 mEq/ L  infusion   Intravenous Continuous Doree Albee, MD 100 mL/hr at 11/12/15 0839    . acyclovir (ZOVIRAX) 200 MG capsule 400 mg  400 mg Oral TID Erline Hau, MD   400 mg at 11/12/15 0840  . amLODipine (NORVASC) tablet 5 mg  5 mg Oral Daily Nimish Luther Parody, MD   5 mg at 11/12/15 0840  . atorvastatin (LIPITOR) tablet 20 mg  20 mg Oral q1800 Doree Albee, MD   20 mg at 11/11/15 2319  . ciprofloxacin (CIPRO) IVPB 400 mg  400 mg Intravenous Q12H Doree Albee, MD   400 mg at 11/12/15 0839  . enoxaparin (LOVENOX) injection 40  mg  40 mg Subcutaneous Q24H Nimish C Anastasio Champion, MD   40 mg at 11/11/15 2316  . HYDROcodone-acetaminophen (NORCO) 10-325 MG per tablet 1 tablet  1 tablet Oral Q6H PRN Nimish C Gosrani, MD      . HYDROmorphone (DILAUDID) injection 1 mg  1 mg Intravenous Once Carmin Muskrat, MD   Stopped at 11/11/15 2112  . insulin aspart (novoLOG) injection 0-15 Units  0-15 Units Subcutaneous TID WC Nimish Luther Parody, MD   0 Units at 11/12/15 0800  . insulin aspart (novoLOG) injection 0-5 Units  0-5 Units Subcutaneous QHS Doree Albee, MD   0 Units at 11/11/15 2200  . lisinopril (PRINIVIL,ZESTRIL) tablet 40 mg   40 mg Oral Daily Nimish C Anastasio Champion, MD   40 mg at 11/12/15 0840  . [START ON 11/14/2015] metFORMIN (GLUCOPHAGE-XR) 24 hr tablet 500 mg  500 mg Oral Q breakfast Nimish C Gosrani, MD      . metroNIDAZOLE (FLAGYL) IVPB 500 mg  500 mg Intravenous Q8H Nimish C Gosrani, MD   500 mg at 11/12/15 [id number]  . morphine 2 MG/ML injection 2 mg  2 mg Intravenous Q4H PRN Nimish C Gosrani, MD      . nitroGLYCERIN (NITROSTAT) SL tablet 0.4 mg  0.4 mg Sublingual Q5 min PRN Nimish C Gosrani, MD      . ondansetron (ZOFRAN) tablet 4 mg  4 mg Oral Q6H PRN Nimish C Gosrani, MD       Or  . ondansetron (ZOFRAN) injection 4 mg  4 mg Intravenous Q6H PRN Nimish Luther Parody, MD   4 mg at 11/12/15 0432  . traZODone (DESYREL) tablet 150 mg  150 mg Oral QHS Nimish C Anastasio Champion, MD   150 mg at 11/11/15 2315    Allergies as of 11/11/2015  . (No Known Allergies)    Past Medical History  Diagnosis Date  . Hypertension   . Kidney tumor   . Anginal pain ([place])   . Diabetes mellitus without complication ([place])   . Depression   . Hyperlipidemia   . Obesity     Past Surgical History  Procedure Laterality Date  . Tonsillectomy    . Hernia repair    . Embolization    . Cardiac catheterization  2009    normal coronary arteries, preserved LVEF 60%  . Colonoscopy  2004    Dr. Collene Mares: hemorrhoids, diverticulosis, some stool present and small lesions could have been missed. repeat colonoscopy in five years.   . Cholecystectomy  2006    Dr. Hulen Skains.  . Colonoscopy  2016    Dr. Collene Mares: unremarkable per patient    Family History  Problem Relation Age of Onset  . Diabetes Mother   . Colon cancer Brother     deceased age 66  . Breast cancer Maternal Grandmother     Social History   Social History  . Marital Status: Married    Spouse Name: N/A  . Number of Children: N/A  . Years of Education: N/A   Occupational History  . Not on file.   Social History Main Topics  . Smoking status: Never Smoker   . Smokeless tobacco: Not on  file  . Alcohol Use: No  . Drug Use: No  . Sexual Activity: Yes    Birth Control/ Protection: Post-menopausal   Other Topics Concern  . Not on file   Social History Narrative     ROS:  General: Negative for anorexia, unintentional weight loss, fever, chills, fatigue, weakness. Eyes:  Negative for vision changes.  ENT: Negative for hoarseness, difficulty swallowing , nasal congestion. CV: Negative for chest pain, angina, palpitations, dyspnea on exertion, peripheral edema.  Respiratory: Negative for dyspnea at rest, dyspnea on exertion, cough, sputum, wheezing.  GI: See history of present illness. GU:  Negative for dysuria, hematuria, urinary incontinence, urinary frequency, nocturnal urination.  MS: Negative for joint pain, low back pain.  Derm: Negative for rash or itching.  Neuro: Negative for weakness, abnormal sensation, seizure, frequent headaches, memory loss, confusion.  Psych: Negative for anxiety, depression, suicidal ideation, hallucinations.  Endo: Negative for unusual weight change.  Heme: Negative for bruising or bleeding. Allergy: Negative for rash or hives.       Physical Examination: Vital signs in last 24 hours: Temp:  [97.5 F (36.4 C)-98.3 F (36.8 C)] 97.5 F (36.4 C) (01/12 [id number]) Pulse Rate:  [84-94] 90 (01/12 0608) Resp:  [17-26] 17 (01/12 0608) BP: (131-191)/(60-100) 131/86 mmHg (01/12 0608) SpO2:  [91 %-100 %] 98 % (01/12 0608) Weight:  [195 lb (88.451 kg)-197 lb 5 oz (89.5 kg)] 197 lb 5 oz (89.5 kg) (01/11 2157) Last BM Date: 11/08/15  General: Well-nourished, well-developed in no acute distress.  Head: Normocephalic, atraumatic.   Eyes: Conjunctiva pink, no icterus. Mouth: Oropharyngeal mucosa moist and pink , no lesions erythema or exudate. Neck: Supple without thyromegaly, masses, or lymphadenopathy.  Lungs: Clear to auscultation bilaterally.  Heart: Regular rate and rhythm, no murmurs rubs or gallops.  Abdomen: Bowel sounds are normal,  slight distention. Moderate to severe tenderness at level of umbilicus, centrally with subjective guarding. No rebound. no hepatosplenomegaly or masses, no abdominal bruits or    hernia.   Rectal: not performed Extremities: No lower extremity edema, clubbing, deformity.  Neuro: Alert and oriented x 4 , grossly normal neurologically.  Skin: Warm and dry, no rash or jaundice.   Psych: Alert and cooperative, normal mood and affect.        Intake/Output from previous day: 01/11 0701 - 01/12 0700 In: 713.3 [I.V.:713.3] Out: -  Intake/Output this shift:    Lab Results: CBC  Recent Labs  11/11/15 1707 11/12/15 0617  WBC 11.9* 10.1  HGB 14.7 13.4  HCT 43.3 41.6  MCV 89.1 91.8  PLT 261 243   BMET  Recent Labs  11/11/15 1707 11/12/15 0617  NA 144 141  K 3.4* 4.2  CL 106 106  CO2 26 29  GLUCOSE 147* 153*  BUN 14 11  CREATININE 1.10* 0.82  CALCIUM 9.4 8.2*   LFT  Recent Labs  11/11/15 1707 11/12/15 0617  BILITOT 0.8 0.7  ALKPHOS 80 74  AST 18 27  ALT 14 20  PROT 7.1 5.8*  ALBUMIN 4.2 3.4*    Lipase  Recent Labs  11/11/15 1707  LIPASE 16    PT/INR No results for input(s): LABPROT, INR in the last 72 hours.    Imaging Studies: Ct Abdomen Pelvis W Contrast  11/11/2015  ADDENDUM REPORT: 11/11/2015 21:21 ADDENDUM: Comparison is now made with CT angio chest and upper abdomen performed 01/04/2005. When compared with the prior study, the lesion in the upper pole of the left kidney likely represents scarring from the previous angiomyolipoma. The post embolization changes otherwise in the left kidney are stable. Electronically Signed   By: Nolon Nations M.D.   On: 11/11/2015 21:21  11/11/2015  CLINICAL DATA:  Oral contrast per protocol; severe abdominal pain and spasms since 11/08/15; bloating; hx kidney tumor; diabetes-metformin sheet given. 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  EXAM: CT ABDOMEN AND PELVIS WITH CONTRAST TECHNIQUE: Multidetector CT imaging of the abdomen and pelvis was performed using the standard protocol following bolus administration of intravenous contrast. CONTRAST:  11mL OMNIPAQUE IOHEXOL 300 MG/ML  SOLN COMPARISON:  01/04/2005 ultrasound and report from multiple prior studies FINDINGS: Lower chest: No pulmonary nodules, pleural effusions, or infiltrates. Heart size is normal. No imaged pericardial effusion or significant coronary artery calcifications. Upper abdomen: Right benign adrenal adenoma is present. Status post cholecystectomy. There is focal fatty infiltration of the liver adjacent to the falciform ligament. No focal liver lesion identified. Spleen has a normal appearance. There is a rim enhancing lesion involving the upper pole of the left kidney. Lesion is exophytic and measures 1.7 cm in diameter and water attenuation in density. There is a 1.5 cm fatty attenuation within the anterior upper pole of the left kidney, consistent with angiomyolipoma. There is a defect in the upper pole the left kidney, likely secondary to scarring. Coils are identified within the hilar portion of the upper pole of the left kidney following previous embolization of hemorrhagic left renal lesion. Low-attenuation lesions in the left kidney are favored to be cysts. There is no hydronephrosis or hydroureter. Gastrointestinal tract: Stomach has a normal appearance. There is dilatation of the proximal jejunal loops. The proximal and mid ileal loops are markedly abnormal, with wall thickening and mesenteric edema. The terminal ileal loops are normal in caliber. There is no evidence for bowel obstruction. There is significant stool within the sigmoid colon. Scattered colonic diverticula are present. Pelvis: Uterus is absent. There is moderate free pelvic fluid. No adnexal mass. Retroperitoneum: Atherosclerotic calcification of the abdominal aorta. No retroperitoneal or mesenteric  adenopathy. Abdominal wall: Supra umbilical surgical changes. Osseous structures: No suspicious lytic or blastic lesions are identified. L2 hemangioma noted. IMPRESSION: 1. Marked abnormality of the proximal and mid to distal ileum. Considerations include inflammatory and infectious abnormalities. 2. Rim enhancing lesion in the upper pole of the left kidney warrants further evaluation. Although this could represent a residual angiomyolipoma, the CT density measurements are indeterminate. Renal lesion protocol MRI is recommended. MRI should be performed when the patient is clinically stable and able to follow breath holding instructions (usually best performed on an outpatient basis). 3. Benign right adrenal adenoma. 4. Hepatic steatosis. 5. Status post cholecystectomy. Electronically Signed: By: Nolon Nations M.D. On: 11/11/2015 20:34  [4 week]   Impression: 71 y/o female with acute onset abdominal cramps associated with nausea. No diarrhea or vomiting. At baseline, fairly asymptomatic except for occasional right mid-abdominal "stabbing" pain with is nagging. CT with markedly abnormal proximal and mid to distal ileum with wall thickening and mesenteric edema. Proximal jejunal loops with dilation. Reviewed CT with Dr. Thornton Papas. Mesenteric vasculature looks good. Findings most c/w infectious vs inflammatory rather than ischemic. No evidence of herniation. Abnormality seen within ileum DOES NOT appear reachable with colonoscope.   Plan: 1. Given significant abdominal pain on exam, we will check lactic acid.  2. Agree with IV cipro/flagyl. 3. Monitor closely. 4. Discussed briefly with Dr. Gala Romney. Further recommendations to follow pending labs.  We would like to thank you for the opportunity to participate in the care of [name].  Laureen Ochs. Bernarda Caffey [facility] [phone] 1/12/201710:38 AM     LOS: 1 day

## 2015-11-12 NOTE — Telephone Encounter (Signed)
Needs hospital follow up in six weeks with Dr. Gala Romney, Randall Hiss, or myself. Consider CTE at time of follow up based on symptoms.

## 2015-11-12 NOTE — Telephone Encounter (Signed)
APPT MADE AND 300 NURSE NOTIFIED

## 2015-11-12 NOTE — Progress Notes (Signed)
[facility] PROGRESS NOTE  Carolyn CLEVERLY [id number] DOB: 1946-01-10 DOA: 11/11/2015 PCP: Florina Ou, MD  Assessment/Plan: Ileitis -Likely representing infectious versus inflammatory changes. -Plan to continue IV Cipro Flagyl, advance diet as tolerated but clear on clear liquids today per patient request. -Appreciate GI input and recommendations  Benign essential hypertension -Well-controlled, continue current regimen.  Diabetes mellitus -Well-controlled, continue current regimen.  Upper pole lesion of the left kidney -From prior angiomyolipoma. -No further workup required.  Code Status: Full code Family Communication: Patient only  Disposition Plan: Home when ready, anticipate 48 hours   Consultants:  GI   Antibiotics:  Cipro  Flagyl   Subjective: Complains of significant abdominal bloating and pain  Objective: Filed Vitals:   11/11/15 2157 11/12/15 0608 11/12/15 1501 11/12/15 1509  BP:  131/86 119/56   Pulse:  90 77   Temp:  97.5 F (36.4 C) 98.4 F (36.9 C)   TempSrc:  Oral Oral   Resp:  17 18   Height: 5' 8.5" (1.74 m)     Weight: 89.5 kg (197 lb 5 oz)     SpO2:  98% 98% 95%    Intake/Output Summary (Last 24 hours) at 11/12/15 1556 Last data filed at 11/12/15 1402  Gross per 24 hour  Intake 1493.33 ml  Output    200 ml  Net 1293.33 ml   Filed Weights   11/11/15 1619 11/11/15 2157  Weight: 88.451 kg (195 lb) 89.5 kg (197 lb 5 oz)    Exam:   General:  Alert, awake, oriented 3  Cardiovascular: Regular rate and rhythm  Respiratory: Clear to auscultation bilaterally  Abdomen: Soft, positive bowel sounds, exquisitely tender to palpation specifically of the right upper and lower quadrants  Extremities: Trace bilateral pitting edema   Neurologic:  Grossly intact and nonfocal  Data Reviewed: Basic Metabolic Panel:  Recent Labs Lab 11/11/15 1707 11/12/15 0617  NA 144 141  K 3.4* 4.2  CL 106 106  CO2 26 29    GLUCOSE 147* 153*  BUN 14 11  CREATININE 1.10* 0.82  CALCIUM 9.4 8.2*   Liver Function Tests:  Recent Labs Lab 11/11/15 1707 11/12/15 0617  AST 18 27  ALT 14 20  ALKPHOS 80 74  BILITOT 0.8 0.7  PROT 7.1 5.8*  ALBUMIN 4.2 3.4*    Recent Labs Lab 11/11/15 1707  LIPASE 16   No results for input(s): AMMONIA in the last 168 hours. CBC:  Recent Labs Lab 11/11/15 1707 11/12/15 0617  WBC 11.9* 10.1  NEUTROABS 8.8*  --   HGB 14.7 13.4  HCT 43.3 41.6  MCV 89.1 91.8  PLT 261 243   Cardiac Enzymes: No results for input(s): CKTOTAL, CKMB, CKMBINDEX, TROPONINI in the last 168 hours. BNP (last 3 results) No results for input(s): BNP in the last 8760 hours.  ProBNP (last 3 results) No results for input(s): PROBNP in the last 8760 hours.  CBG:  Recent Labs Lab 11/11/15 2153 11/12/15 0732 11/12/15 1102  GLUCAP 140* 120* 153*    No results found for this or any previous visit (from the past 240 hour(s)).   Studies: Ct Abdomen Pelvis W Contrast  11/11/2015  ADDENDUM REPORT: 11/11/2015 21:21 ADDENDUM: Comparison is now made with CT angio chest and upper abdomen performed 01/04/2005. When compared with the prior study, the lesion in the upper pole of the left kidney likely represents scarring from the previous angiomyolipoma. The post embolization changes otherwise in the left kidney are stable. Electronically  Signed   By: Nolon Nations M.D.   On: 11/11/2015 21:21  11/11/2015  CLINICAL DATA:  Oral contrast per protocol; severe abdominal pain and spasms since 11/08/15; bloating; hx kidney tumor; diabetes-metformin sheet given. 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 EXAM: CT ABDOMEN AND PELVIS WITH CONTRAST TECHNIQUE: Multidetector CT imaging of the abdomen and pelvis was performed using the standard protocol following bolus administration of intravenous contrast. CONTRAST:  13mL OMNIPAQUE  IOHEXOL 300 MG/ML  SOLN COMPARISON:  01/04/2005 ultrasound and report from multiple prior studies FINDINGS: Lower chest: No pulmonary nodules, pleural effusions, or infiltrates. Heart size is normal. No imaged pericardial effusion or significant coronary artery calcifications. Upper abdomen: Right benign adrenal adenoma is present. Status post cholecystectomy. There is focal fatty infiltration of the liver adjacent to the falciform ligament. No focal liver lesion identified. Spleen has a normal appearance. There is a rim enhancing lesion involving the upper pole of the left kidney. Lesion is exophytic and measures 1.7 cm in diameter and water attenuation in density. There is a 1.5 cm fatty attenuation within the anterior upper pole of the left kidney, consistent with angiomyolipoma. There is a defect in the upper pole the left kidney, likely secondary to scarring. Coils are identified within the hilar portion of the upper pole of the left kidney following previous embolization of hemorrhagic left renal lesion. Low-attenuation lesions in the left kidney are favored to be cysts. There is no hydronephrosis or hydroureter. Gastrointestinal tract: Stomach has a normal appearance. There is dilatation of the proximal jejunal loops. The proximal and mid ileal loops are markedly abnormal, with wall thickening and mesenteric edema. The terminal ileal loops are normal in caliber. There is no evidence for bowel obstruction. There is significant stool within the sigmoid colon. Scattered colonic diverticula are present. Pelvis: Uterus is absent. There is moderate free pelvic fluid. No adnexal mass. Retroperitoneum: Atherosclerotic calcification of the abdominal aorta. No retroperitoneal or mesenteric adenopathy. Abdominal wall: Supra umbilical surgical changes. Osseous structures: No suspicious lytic or blastic lesions are identified. L2 hemangioma noted. IMPRESSION: 1. Marked abnormality of the proximal and mid to distal ileum.  Considerations include inflammatory and infectious abnormalities. 2. Rim enhancing lesion in the upper pole of the left kidney warrants further evaluation. Although this could represent a residual angiomyolipoma, the CT density measurements are indeterminate. Renal lesion protocol MRI is recommended. MRI should be performed when the patient is clinically stable and able to follow breath holding instructions (usually best performed on an outpatient basis). 3. Benign right adrenal adenoma. 4. Hepatic steatosis. 5. Status post cholecystectomy. Electronically Signed: By: Nolon Nations M.D. On: 11/11/2015 20:34    Scheduled Meds: . acyclovir  400 mg Oral TID  . amLODipine  5 mg Oral Daily  . atorvastatin  20 mg Oral q1800  . ciprofloxacin  400 mg Intravenous Q12H  . enoxaparin (LOVENOX) injection  40 mg Subcutaneous Q24H  .  HYDROmorphone (DILAUDID) injection  1 mg Intravenous Once  . insulin aspart  0-15 Units Subcutaneous TID WC  . insulin aspart  0-5 Units Subcutaneous QHS  . lisinopril  40 mg Oral Daily  . [START ON 11/14/2015] metFORMIN  500 mg Oral Q breakfast  . metronidazole  500 mg Intravenous Q8H  . traZODone  150 mg Oral QHS   Continuous Infusions: . 0.9 % NaCl with KCl 20 mEq / L 100 mL/hr at 11/12/15 [id number]    Active Problems:  Abdominal pain   Hypertension   Diabetes ([place])   Ileitis    Time spent: 25 minutes. Greater than 50% of this time was spent in direct contact with the patient coordinating care.    Lelon Frohlich  [facility] Pager [phone]  If 7PM-7AM, please contact night-coverage at [email or web address], password [facility] 11/12/2015, 3:56 PM  LOS: 1 day

## 2015-11-12 NOTE — Care Management Obs Status (Signed)
[facility] NOTIFICATION   Patient Details  Name: [name] MRN: [id number] Date of Birth: 12/17/1945   Medicare Observation Status Notification Given:  Yes    Alvie Heidelberg, RN 11/12/2015, 3:27 PM

## 2015-11-12 NOTE — Care Management Note (Signed)
Case Management Note  Patient Details  Name: [name] MRN: [id number] Date of Birth: 1946-02-12  Subjective/Objective:                  Pt admitted from home with ileitis. Pt lives with her husband and will return home at discharge. Pt independent with ADL's.  Action/Plan: No Cm needs noted.  Expected Discharge Date:  11/13/15               Expected Discharge Plan:  Home/Self Care  In-House Referral:  NA  Discharge planning Services  CM Consult  Post Acute Care Choice:  NA Choice offered to:  NA  DME Arranged:    DME Agency:     HH Arranged:    HH Agency:     Status of Service:  Completed, signed off  Medicare Important Message Given:    Date Medicare IM Given:    Medicare IM give by:    Date Additional Medicare IM Given:    Additional Medicare Important Message give by:     If discussed at [place] of Stay Meetings, dates discussed:    Additional Comments:  Joylene Draft, RN 11/12/2015, 2:49 PM

## 2015-11-13 DIAGNOSIS — I10 Essential (primary) hypertension: Secondary | ICD-10-CM

## 2015-11-13 DIAGNOSIS — K529 Noninfective gastroenteritis and colitis, unspecified: Secondary | ICD-10-CM | POA: Diagnosis not present

## 2015-11-13 DIAGNOSIS — R1010 Upper abdominal pain, unspecified: Secondary | ICD-10-CM | POA: Diagnosis not present

## 2015-11-13 LAB — GLUCOSE, CAPILLARY
Glucose-Capillary: 118 mg/dL — ABNORMAL HIGH (ref 65–99)
Glucose-Capillary: 142 mg/dL — ABNORMAL HIGH (ref 65–99)
Glucose-Capillary: 110 mg/dL — ABNORMAL HIGH (ref 65–99)
Glucose-Capillary: 106 mg/dL — ABNORMAL HIGH (ref 65–99)

## 2015-11-13 MED ORDER — METRONIDAZOLE 500 MG PO TABS
500.0000 mg | ORAL_TABLET | Freq: Three times a day (TID) | ORAL | Status: DC
  Administered 2015-11-13 – 2015-11-14 (×4): 500 mg via ORAL
  Filled 2015-11-13 (×4): qty 1

## 2015-11-13 MED ORDER — CIPROFLOXACIN HCL 250 MG PO TABS
500.0000 mg | ORAL_TABLET | Freq: Two times a day (BID) | ORAL | Status: DC
  Administered 2015-11-13 – 2015-11-14 (×2): 500 mg via ORAL
  Filled 2015-11-13 (×2): qty 2

## 2015-11-13 MED ORDER — DOCUSATE SODIUM 100 MG PO CAPS
100.0000 mg | ORAL_CAPSULE | Freq: Every day | ORAL | Status: DC
  Administered 2015-11-13 – 2015-11-14 (×2): 100 mg via ORAL
  Filled 2015-11-13 (×2): qty 1

## 2015-11-13 NOTE — Progress Notes (Signed)
REVIEWED-NO ADDITIONAL RECOMMENDATIONS.   Subjective: Feels a little better today than yesterday. Had some abdominal pain this morning which resolved. Clear liquids since last night, tolerating them well. No N/V. No bowel movement since Sunday. Is anxious to be able to go home.  Objective: Vital signs in last 24 hours: Temp:  [98.2 F (36.8 C)-98.4 F (36.9 C)] 98.2 F (36.8 C) (01/12 2152) Pulse Rate:  [74-77] 74 (01/12 2152) Resp:  [18-20] 20 (01/12 2152) BP: (119-149)/(56) 149/56 mmHg (01/12 2152) SpO2:  [95 %-100 %] 100 % (01/12 2152) Last BM Date: 11/08/15 General:   Alert and oriented, pleasant Head:  Normocephalic and atraumatic. Eyes:  No icterus, sclera clear. Conjuctiva pink.  Mouth:  Without lesions, mucosa pink and moist.  Lungs: Clear to auscultation bilaterally, without wheezing, rales, or rhonchi.  Abdomen:  Bowel sounds present, soft, non-distended. Moderate abdominal TTP. No rebound or guarding. No masses appreciated  Extremities:  Without clubbing or edema. Neurologic:  Alert and  oriented x4;  grossly normal neurologically.  Psych:  Alert and cooperative. Normal mood and affect.  Intake/Output from previous day: 01/12 0701 - 01/13 0700 In: 1140 [P.O.:840; IV Piggyback:300] Out: 800 [Urine:800] Intake/Output this shift: Total I/O In: 240 [P.O.:240] Out: 75 [Urine:75]  Lab Results:  Recent Labs  11/11/15 1707 11/12/15 0617  WBC 11.9* 10.1  HGB 14.7 13.4  HCT 43.3 41.6  PLT 261 243   BMET  Recent Labs  11/11/15 1707 11/12/15 0617  NA 144 141  K 3.4* 4.2  CL 106 106  CO2 26 29  GLUCOSE 147* 153*  BUN 14 11  CREATININE 1.10* 0.82  CALCIUM 9.4 8.2*   LFT  Recent Labs  11/11/15 1707 11/12/15 0617  PROT 7.1 5.8*  ALBUMIN 4.2 3.4*  AST 18 27  ALT 14 20  ALKPHOS 80 74  BILITOT 0.8 0.7   PT/INR No results for input(s): LABPROT, INR in the last 72 hours. Hepatitis Panel No results for input(s): HEPBSAG, HCVAB, HEPAIGM, HEPBIGM in  the last 72 hours.   Studies/Results: Ct Abdomen Pelvis W Contrast  11/11/2015  ADDENDUM REPORT: 11/11/2015 21:21 ADDENDUM: Comparison is now made with CT angio chest and upper abdomen performed 01/04/2005. When compared with the prior study, the lesion in the upper pole of the left kidney likely represents scarring from the previous angiomyolipoma. The post embolization changes otherwise in the left kidney are stable. Electronically Signed   By: Nolon Nations M.D.   On: 11/11/2015 21:21  11/11/2015  CLINICAL DATA:  Oral contrast per protocol; severe abdominal pain and spasms since 11/08/15; bloating; hx kidney tumor; diabetes-metformin sheet given. 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 EXAM: CT ABDOMEN AND PELVIS WITH CONTRAST TECHNIQUE: Multidetector CT imaging of the abdomen and pelvis was performed using the standard protocol following bolus administration of intravenous contrast. CONTRAST:  131mL OMNIPAQUE IOHEXOL 300 MG/ML  SOLN COMPARISON:  01/04/2005 ultrasound and report from multiple prior studies FINDINGS: Lower chest: No pulmonary nodules, pleural effusions, or infiltrates. Heart size is normal. No imaged pericardial effusion or significant coronary artery calcifications. Upper abdomen: Right benign adrenal adenoma is present. Status post cholecystectomy. There is focal fatty infiltration of the liver adjacent to the falciform ligament. No focal liver lesion identified. Spleen has a normal appearance. There is a rim enhancing lesion involving the upper pole of the left kidney. Lesion is exophytic and measures 1.7 cm in diameter and water attenuation in density.  There is a 1.5 cm fatty attenuation within the anterior upper pole of the left kidney, consistent with angiomyolipoma. There is a defect in the upper pole the left kidney, likely secondary to scarring. Coils are identified within the hilar portion of the  upper pole of the left kidney following previous embolization of hemorrhagic left renal lesion. Low-attenuation lesions in the left kidney are favored to be cysts. There is no hydronephrosis or hydroureter. Gastrointestinal tract: Stomach has a normal appearance. There is dilatation of the proximal jejunal loops. The proximal and mid ileal loops are markedly abnormal, with wall thickening and mesenteric edema. The terminal ileal loops are normal in caliber. There is no evidence for bowel obstruction. There is significant stool within the sigmoid colon. Scattered colonic diverticula are present. Pelvis: Uterus is absent. There is moderate free pelvic fluid. No adnexal mass. Retroperitoneum: Atherosclerotic calcification of the abdominal aorta. No retroperitoneal or mesenteric adenopathy. Abdominal wall: Supra umbilical surgical changes. Osseous structures: No suspicious lytic or blastic lesions are identified. L2 hemangioma noted. IMPRESSION: 1. Marked abnormality of the proximal and mid to distal ileum. Considerations include inflammatory and infectious abnormalities. 2. Rim enhancing lesion in the upper pole of the left kidney warrants further evaluation. Although this could represent a residual angiomyolipoma, the CT density measurements are indeterminate. Renal lesion protocol MRI is recommended. MRI should be performed when the patient is clinically stable and able to follow breath holding instructions (usually best performed on an outpatient basis). 3. Benign right adrenal adenoma. 4. Hepatic steatosis. 5. Status post cholecystectomy. Electronically Signed: By: Nolon Nations M.D. On: 11/11/2015 20:34    Assessment: 70 y/o female with acute onset abdominal cramps associated with nausea. No diarrhea or vomiting. At baseline, fairly asymptomatic except for occasional right mid-abdominal "stabbing" pain with is nagging. CT with markedly abnormal proximal and mid to distal ileum with wall thickening and  mesenteric edema. Proximal jejunal loops with dilation. Reviewed CT with Dr. Thornton Papas. Mesenteric vasculature looks good, unlikely ischemic colitis. Findings most likely infectious/inflammatory, inflammation distribution not consistent with Crohn's. No evidence of herniation. Abnormality seen within ileum DOES NOT appear reachable with colonoscope. Lactic acid drawn 11/12/15 was normal.  Today she feels better than yesterday. Tolerating clear liquid diet x 3 meals without worsening abdominal pain, N/V. Had some recurrent abdominal pain this morning that resolved. Has not had a bowel movement in about 5 days, normally take a stool softener at home. Continues on antibiotics.   Plan: 1. Start daily stool softener. If no bowel movement in the next day may need additional agents 2. Advance diet to full liquids for tolerance. If she does well can continue to advance diet. 3. Continue antibiotics 4. Monitor for worsening symptoms or for intolerance in diet. 5. Will ultimately need 6 week post-hospitalization follow-up and possibly reimaging with CTE   Walden Field, AGNP-C Adult & Gerontological Nurse Practitioner [facility]    LOS: 2 days    11/13/2015, 12:42 PM

## 2015-11-13 NOTE — Progress Notes (Signed)
[facility] PROGRESS NOTE  [name] [id number] DOB: 03-Jul-1946 DOA: 11/11/2015 PCP: Florina Ou, MD  Assessment/Plan: Ileitis -Likely representing infectious versus inflammatory changes. -Patient's abdominal pain is improved although she still relates some abdominal tenderness and swelling. -Per patient request will advance diet to solids, change antibiotics to oral. If tolerates can potentially discharge home in 24-48 hours. -Appreciate GI input and recommendations  Benign essential hypertension -Well-controlled, continue current regimen.  Diabetes mellitus -Well-controlled, continue current regimen.  Upper pole lesion of the left kidney -From prior angiomyolipoma. -No further workup required.  Code Status: Full code Family Communication: Patient only  Disposition Plan: Home when ready, anticipate 24 hours   Consultants:  GI   Antibiotics:  Cipro  Flagyl   Subjective: Abdominal pain improved, wants to eat solid food.  Objective: Filed Vitals:   11/12/15 1501 11/12/15 1509 11/12/15 2152 11/13/15 1403  BP: 119/56  149/56 127/47  Pulse: 77  74 76  Temp: 98.4 F (36.9 C)  98.2 F (36.8 C) 98.8 F (37.1 C)  TempSrc: Oral  Oral Oral  Resp: 18  20 18   Height:      Weight:      SpO2: 98% 95% 100% 100%    Intake/Output Summary (Last 24 hours) at 11/13/15 1554 Last data filed at 11/13/15 1300  Gross per 24 hour  Intake    840 ml  Output    675 ml  Net    165 ml   Filed Weights   11/11/15 1619 11/11/15 2157  Weight: 88.451 kg (195 lb) 89.5 kg (197 lb 5 oz)    Exam:   General:  Alert, awake, oriented 3  Cardiovascular: Regular rate and rhythm  Respiratory: Clear to auscultation bilaterally  Abdomen: Soft, positive bowel sounds, tender to palpation specifically of the right upper and lower quadrants  Extremities: Trace bilateral pitting edema   Neurologic:  Grossly intact and nonfocal  Data Reviewed: Basic Metabolic  Panel:  Recent Labs Lab 11/11/15 1707 11/12/15 0617  NA 144 141  K 3.4* 4.2  CL 106 106  CO2 26 29  GLUCOSE 147* 153*  BUN 14 11  CREATININE 1.10* 0.82  CALCIUM 9.4 8.2*   Liver Function Tests:  Recent Labs Lab 11/11/15 1707 11/12/15 0617  AST 18 27  ALT 14 20  ALKPHOS 80 74  BILITOT 0.8 0.7  PROT 7.1 5.8*  ALBUMIN 4.2 3.4*    Recent Labs Lab 11/11/15 1707  LIPASE 16   No results for input(s): AMMONIA in the last 168 hours. CBC:  Recent Labs Lab 11/11/15 1707 11/12/15 0617  WBC 11.9* 10.1  NEUTROABS 8.8*  --   HGB 14.7 13.4  HCT 43.3 41.6  MCV 89.1 91.8  PLT 261 243   Cardiac Enzymes: No results for input(s): CKTOTAL, CKMB, CKMBINDEX, TROPONINI in the last 168 hours. BNP (last 3 results) No results for input(s): BNP in the last 8760 hours.  ProBNP (last 3 results) No results for input(s): PROBNP in the last 8760 hours.  CBG:  Recent Labs Lab 11/12/15 1102 11/12/15 1652 11/12/15 2152 11/13/15 0803 11/13/15 1136  GLUCAP 153* 103* 85 118* 142*    No results found for this or any previous visit (from the past 240 hour(s)).   Studies: Ct Abdomen Pelvis W Contrast  11/11/2015  ADDENDUM REPORT: 11/11/2015 21:21 ADDENDUM: Comparison is now made with CT angio chest and upper abdomen performed 01/04/2005. When compared with the prior study, the lesion in the upper pole of  the left kidney likely represents scarring from the previous angiomyolipoma. The post embolization changes otherwise in the left kidney are stable. Electronically Signed   By: Nolon Nations M.D.   On: 11/11/2015 21:21  11/11/2015  CLINICAL DATA:  Oral contrast per protocol; severe abdominal pain and spasms since 11/08/15; bloating; hx kidney tumor; diabetes-metformin sheet given. 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 EXAM: CT ABDOMEN AND PELVIS WITH CONTRAST TECHNIQUE: Multidetector CT imaging of the  abdomen and pelvis was performed using the standard protocol following bolus administration of intravenous contrast. CONTRAST:  182mL OMNIPAQUE IOHEXOL 300 MG/ML  SOLN COMPARISON:  01/04/2005 ultrasound and report from multiple prior studies FINDINGS: Lower chest: No pulmonary nodules, pleural effusions, or infiltrates. Heart size is normal. No imaged pericardial effusion or significant coronary artery calcifications. Upper abdomen: Right benign adrenal adenoma is present. Status post cholecystectomy. There is focal fatty infiltration of the liver adjacent to the falciform ligament. No focal liver lesion identified. Spleen has a normal appearance. There is a rim enhancing lesion involving the upper pole of the left kidney. Lesion is exophytic and measures 1.7 cm in diameter and water attenuation in density. There is a 1.5 cm fatty attenuation within the anterior upper pole of the left kidney, consistent with angiomyolipoma. There is a defect in the upper pole the left kidney, likely secondary to scarring. Coils are identified within the hilar portion of the upper pole of the left kidney following previous embolization of hemorrhagic left renal lesion. Low-attenuation lesions in the left kidney are favored to be cysts. There is no hydronephrosis or hydroureter. Gastrointestinal tract: Stomach has a normal appearance. There is dilatation of the proximal jejunal loops. The proximal and mid ileal loops are markedly abnormal, with wall thickening and mesenteric edema. The terminal ileal loops are normal in caliber. There is no evidence for bowel obstruction. There is significant stool within the sigmoid colon. Scattered colonic diverticula are present. Pelvis: Uterus is absent. There is moderate free pelvic fluid. No adnexal mass. Retroperitoneum: Atherosclerotic calcification of the abdominal aorta. No retroperitoneal or mesenteric adenopathy. Abdominal wall: Supra umbilical surgical changes. Osseous structures: No  suspicious lytic or blastic lesions are identified. L2 hemangioma noted. IMPRESSION: 1. Marked abnormality of the proximal and mid to distal ileum. Considerations include inflammatory and infectious abnormalities. 2. Rim enhancing lesion in the upper pole of the left kidney warrants further evaluation. Although this could represent a residual angiomyolipoma, the CT density measurements are indeterminate. Renal lesion protocol MRI is recommended. MRI should be performed when the patient is clinically stable and able to follow breath holding instructions (usually best performed on an outpatient basis). 3. Benign right adrenal adenoma. 4. Hepatic steatosis. 5. Status post cholecystectomy. Electronically Signed: By: Nolon Nations M.D. On: 11/11/2015 20:34    Scheduled Meds: . acyclovir  400 mg Oral TID  . amLODipine  5 mg Oral Daily  . atorvastatin  20 mg Oral q1800  . ciprofloxacin  500 mg Oral BID  . docusate sodium  100 mg Oral Daily  . enoxaparin (LOVENOX) injection  40 mg Subcutaneous Q24H  .  HYDROmorphone (DILAUDID) injection  1 mg Intravenous Once  . insulin aspart  0-15 Units Subcutaneous TID WC  . insulin aspart  0-5 Units Subcutaneous QHS  . lisinopril  40 mg Oral Daily  . [START ON 11/14/2015] metFORMIN  500 mg Oral Q breakfast  . metroNIDAZOLE  500 mg Oral 3 times per  day  . traZODone  150 mg Oral QHS   Continuous Infusions: . 0.9 % NaCl with KCl 20 mEq / L 100 mL/hr at 11/13/15 1218    Active Problems:   Abdominal pain   Hypertension   Diabetes ([place])   Ileitis    Time spent: 25 minutes. Greater than 50% of this time was spent in direct contact with the patient coordinating care.    Lelon Frohlich  [facility] Pager [phone]  If 7PM-7AM, please contact night-coverage at [email or web address], password [facility] 11/13/2015, 3:54 PM  LOS: 2 days

## 2015-11-14 DIAGNOSIS — K529 Noninfective gastroenteritis and colitis, unspecified: Secondary | ICD-10-CM | POA: Diagnosis not present

## 2015-11-14 LAB — CBC
HCT: 37 % (ref 36.0–46.0)
Hemoglobin: 12 g/dL (ref 12.0–15.0)
MCH: 29.7 pg (ref 26.0–34.0)
MCHC: 32.4 g/dL (ref 30.0–36.0)
MCV: 91.6 fL (ref 78.0–100.0)
Platelets: 243 10*3/uL (ref 150–400)
RBC: 4.04 MIL/uL (ref 3.87–5.11)
RDW: 12.8 % (ref 11.5–15.5)
WBC: 6.5 10*3/uL (ref 4.0–10.5)

## 2015-11-14 LAB — BASIC METABOLIC PANEL
Anion gap: 5 (ref 5–15)
BUN: 5 mg/dL — ABNORMAL LOW (ref 6–20)
CO2: 29 mmol/L (ref 22–32)
Calcium: 8.6 mg/dL — ABNORMAL LOW (ref 8.9–10.3)
Chloride: 109 mmol/L (ref 101–111)
Creatinine, Ser: 0.72 mg/dL (ref 0.44–1.00)
GFR calc Af Amer: >60 mL/min (ref >60)
GFR calc non Af Amer: >60 mL/min (ref >60)
Glucose, Bld: 118 mg/dL — ABNORMAL HIGH (ref 65–99)
Potassium: 4 mmol/L (ref 3.5–5.1)
Sodium: 143 mmol/L (ref 135–145)

## 2015-11-14 LAB — GLUCOSE, CAPILLARY
Glucose-Capillary: 158 mg/dL — ABNORMAL HIGH (ref 65–99)
Glucose-Capillary: 107 mg/dL — ABNORMAL HIGH (ref 65–99)

## 2015-11-14 MED ORDER — CIPROFLOXACIN HCL 500 MG PO TABS: 500.0000 mg | ORAL_TABLET | Freq: Two times a day (BID) | ORAL | Status: DC

## 2015-11-14 MED ORDER — METRONIDAZOLE 500 MG PO TABS: 500.0000 mg | ORAL_TABLET | Freq: Three times a day (TID) | ORAL | Status: DC

## 2015-11-14 NOTE — Progress Notes (Signed)
Assessment/Plan: Admitted with ACUTE ILEITIS LIKELY DUE TO INFECTION V. IBD ON JAN 11. FIRST DOSE CIPRO JAN 11 AT 2100. CLINICALLY IMPROVED.  PLAN: 1. OK TO D/C HOME. 2. CIPRO 500 MG BID FOR 2 ADDITIONAL DAYS. NO NEED FOR FLAGYL 3. OPV IN 6 WEEKS W/ DR. Gala Romney  Subjective: Since I last evaluated the patient PT IS TOLERATING REGULAR DIET. ABDOMINAL PAIN IMPROVED. NO NAUSEA OR VOMITING.   Objective: Vital signs in last 24 hours: Filed Vitals:   11/13/15 2214 11/14/15 0600  BP: 161/76 165/67  Pulse: 79 86  Temp: 99 F (37.2 C) 99 F (37.2 C)  Resp: 20 20     General appearance: alert, cooperative and no distress Resp: clear to auscultation bilaterally Cardio: regular rate and rhythm GI: soft, MILD LLQ TTP WITH MILD REBOUND; bowel sounds normal;   Lab Results:  CBC Latest Ref Rng 11/14/2015 11/12/2015 11/11/2015  WBC 4.0 - 10.5 K/uL 6.5 10.1 11.9(H)  Hemoglobin 12.0 - 15.0 g/dL 12.0 13.4 14.7  Hematocrit 36.0 - 46.0 % 37.0 41.6 43.3  Platelets 150 - 400 K/uL 243 243 261      Studies/Results: No results found.  Medications: I have reviewed the patient's current medications.   LOS: 5 days   Barney Drain 04/10/2014, 2:23 PM

## 2015-11-14 NOTE — Discharge Summary (Signed)
Physician Discharge Summary  [name] [id number] DOB: 02-Sep-1946 DOA: 11/11/2015  PCP: Florina Ou, MD  Admit date: 11/11/2015 Discharge date: 11/14/2015  Time spent: 45 minutes  Recommendations for Outpatient Follow-up:  -Will be discharged home today.  -Has a 2 week course planned of Cipro and Flagyl for ileitis. -Follow-up with GI has been arranged for February 27.   Discharge Diagnoses:  Active Problems:   Abdominal pain   Hypertension   Diabetes ([place])   Ileitis   Discharge Condition: Stable and improved  Filed Weights   11/11/15 1619 11/11/15 2157  Weight: 88.451 kg (195 lb) 89.5 kg (197 lb 5 oz)    History of present illness:  As per Dr. Anastasio Champion on 1/11: This is a 70 year old lady, diabetic, who presents with 4 day history of intermittent lower abdominal crampy pain with radiation diffusely across the lower abdomen. She has not opened her bowels in the last 3 days. She did pass some gas today. There is no vomiting. There is no fever. Evaluation in the emergency room with a CT abdominal scan shows a markedly abnormal scan with abnormality of the proximal and mid to distal ileum. Possibilities include inflammatory and/or infectious etiology for this abnormality. She is now being admitted for further investigation and management.   Hospital Course:   Ileitis -Likely representing infectious versus inflammatory changes. -Pelvis improved and she is tolerating solid diet  Benign essential hypertension -Well-controlled, continue current regimen.  Diabetes mellitus -Well-controlled, continue current regimen.  Upper pole lesion of the left kidney -From prior angiomyolipoma. -No further workup required.  Procedures:  None   Consultations:  GI  Discharge Instructions  Discharge Instructions    Diet - low sodium heart healthy    Complete by:  As directed      Increase activity slowly    Complete by:  As directed             Medication List      STOP taking these medications        oxyCODONE 10 mg 12 hr tablet  Commonly known as:  OXYCONTIN     phentermine 37.5 MG tablet  Commonly known as:  ADIPEX-P      TAKE these medications        acyclovir 400 MG tablet  Commonly known as:  ZOVIRAX  Take 400 mg by mouth 3 (three) times daily.     amLODipine 5 MG tablet  Commonly known as:  NORVASC  Take 5 mg by mouth daily.     atorvastatin 20 MG tablet  Commonly known as:  LIPITOR  Take 20 mg by mouth daily.     ciprofloxacin 500 MG tablet  Commonly known as:  CIPRO  Take 1 tablet (500 mg total) by mouth 2 (two) times daily.     HYDROcodone-acetaminophen 10-325 MG tablet  Commonly known as:  NORCO  Take 1 tablet by mouth every 6 (six) hours as needed. pain     lisinopril 40 MG tablet  Commonly known as:  PRINIVIL,ZESTRIL  Take 40 mg by mouth daily.     metFORMIN 500 MG 24 hr tablet  Commonly known as:  GLUCOPHAGE-XR  Take 500 mg by mouth daily with breakfast.     metroNIDAZOLE 500 MG tablet  Commonly known as:  FLAGYL  Take 1 tablet (500 mg total) by mouth every 8 (eight) hours.     nitroGLYCERIN 0.4 MG SL tablet  Commonly known as:  NITROSTAT  Place 0.4 mg under  the tongue every 5 (five) minutes as needed. Pt states continues to take PRN, but rx is out dated.     traZODone 150 MG tablet  Commonly known as:  DESYREL  Take 150 mg by mouth at bedtime.       No Known Allergies     Follow-up Information    Follow up with Neil Crouch, PA-C On 12/28/2015.   Specialty:  Gastroenterology   Why:  Appointment Monday Feb. 27th at 9:30am   Contact information:   [address] [place] [address] [phone]       Follow up with Florina Ou, MD. Schedule an appointment as soon as possible for a visit in 2 weeks.   Specialty:  Family Medicine   Contact information:   [place] [address] [phone]        The  results of significant diagnostics from this hospitalization (including imaging, microbiology, ancillary and laboratory) are listed below for reference.    Significant Diagnostic Studies: Ct Abdomen Pelvis W Contrast  11/11/2015  ADDENDUM REPORT: 11/11/2015 21:21 ADDENDUM: Comparison is now made with CT angio chest and upper abdomen performed 01/04/2005. When compared with the prior study, the lesion in the upper pole of the left kidney likely represents scarring from the previous angiomyolipoma. The post embolization changes otherwise in the left kidney are stable. Electronically Signed   By: Nolon Nations M.D.   On: 11/11/2015 21:21  11/11/2015  CLINICAL DATA:  Oral contrast per protocol; severe abdominal pain and spasms since 11/08/15; bloating; hx kidney tumor; diabetes-metformin sheet given. 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 EXAM: CT ABDOMEN AND PELVIS WITH CONTRAST TECHNIQUE: Multidetector CT imaging of the abdomen and pelvis was performed using the standard protocol following bolus administration of intravenous contrast. CONTRAST:  164mL OMNIPAQUE IOHEXOL 300 MG/ML  SOLN COMPARISON:  01/04/2005 ultrasound and report from multiple prior studies FINDINGS: Lower chest: No pulmonary nodules, pleural effusions, or infiltrates. Heart size is normal. No imaged pericardial effusion or significant coronary artery calcifications. Upper abdomen: Right benign adrenal adenoma is present. Status post cholecystectomy. There is focal fatty infiltration of the liver adjacent to the falciform ligament. No focal liver lesion identified. Spleen has a normal appearance. There is a rim enhancing lesion involving the upper pole of the left kidney. Lesion is exophytic and measures 1.7 cm in diameter and water attenuation in density. There is a 1.5 cm fatty attenuation within the anterior upper pole of the left kidney, consistent with  angiomyolipoma. There is a defect in the upper pole the left kidney, likely secondary to scarring. Coils are identified within the hilar portion of the upper pole of the left kidney following previous embolization of hemorrhagic left renal lesion. Low-attenuation lesions in the left kidney are favored to be cysts. There is no hydronephrosis or hydroureter. Gastrointestinal tract: Stomach has a normal appearance. There is dilatation of the proximal jejunal loops. The proximal and mid ileal loops are markedly abnormal, with wall thickening and mesenteric edema. The terminal ileal loops are normal in caliber. There is no evidence for bowel obstruction. There is significant stool within the sigmoid colon. Scattered colonic diverticula are present. Pelvis: Uterus is absent. There is moderate free pelvic fluid. No adnexal mass. Retroperitoneum: Atherosclerotic calcification of the abdominal aorta. No retroperitoneal or mesenteric adenopathy. Abdominal wall: Supra umbilical surgical changes. Osseous structures: No suspicious lytic  or blastic lesions are identified. L2 hemangioma noted. IMPRESSION: 1. Marked abnormality of the proximal and mid to distal ileum. Considerations include inflammatory and infectious abnormalities. 2. Rim enhancing lesion in the upper pole of the left kidney warrants further evaluation. Although this could represent a residual angiomyolipoma, the CT density measurements are indeterminate. Renal lesion protocol MRI is recommended. MRI should be performed when the patient is clinically stable and able to follow breath holding instructions (usually best performed on an outpatient basis). 3. Benign right adrenal adenoma. 4. Hepatic steatosis. 5. Status post cholecystectomy. Electronically Signed: By: Nolon Nations M.D. On: 11/11/2015 20:34    Microbiology: No results found for this or any previous visit (from the past 240 hour(s)).   Labs: Basic Metabolic Panel:  Recent Labs Lab  11/11/15 1707 11/12/15 0617 11/14/15 0629  NA 144 141 143  K 3.4* 4.2 4.0  CL 106 106 109  CO2 26 29 29   GLUCOSE 147* 153* 118*  BUN 14 11 5*  CREATININE 1.10* 0.82 0.72  CALCIUM 9.4 8.2* 8.6*   Liver Function Tests:  Recent Labs Lab 11/11/15 1707 11/12/15 0617  AST 18 27  ALT 14 20  ALKPHOS 80 74  BILITOT 0.8 0.7  PROT 7.1 5.8*  ALBUMIN 4.2 3.4*    Recent Labs Lab 11/11/15 1707  LIPASE 16   No results for input(s): AMMONIA in the last 168 hours. CBC:  Recent Labs Lab 11/11/15 1707 11/12/15 0617 11/14/15 0629  WBC 11.9* 10.1 6.5  NEUTROABS 8.8*  --   --   HGB 14.7 13.4 12.0  HCT 43.3 41.6 37.0  MCV 89.1 91.8 91.6  PLT 261 243 243   Cardiac Enzymes: No results for input(s): CKTOTAL, CKMB, CKMBINDEX, TROPONINI in the last 168 hours. BNP: BNP (last 3 results) No results for input(s): BNP in the last 8760 hours.  ProBNP (last 3 results) No results for input(s): PROBNP in the last 8760 hours.  CBG:  Recent Labs Lab 11/13/15 1136 11/13/15 1657 11/13/15 2212 11/14/15 0800 11/14/15 1141  GLUCAP 142* 110* 106* 158* 107*       Signed:  HERNANDEZ ACOSTA,Elchonon Maxson  [facility] Pager: [phone] 11/14/2015, 12:45 PM

## 2015-11-14 NOTE — Progress Notes (Signed)
AVS reviewed with patient.  Prescriptions given to patient.  Verbalized understanding of discharge instructions, physician follow-up, medications.  Patient reports belongings intact and in possession at time of discharge.  Patient reports her husband picked up their belongings from the a few days ago.  Patient transported by NT via wheelchair to main entrance for discharge.  Patient transported home by husband in private vehicle.  Patient stable at time of discharge.

## 2015-11-20 DIAGNOSIS — Z1389 Encounter for screening for other disorder: Secondary | ICD-10-CM | POA: Diagnosis not present

## 2015-11-20 DIAGNOSIS — N2889 Other specified disorders of kidney and ureter: Secondary | ICD-10-CM | POA: Diagnosis not present

## 2015-11-20 DIAGNOSIS — I10 Essential (primary) hypertension: Secondary | ICD-10-CM | POA: Diagnosis not present

## 2015-11-20 DIAGNOSIS — K529 Noninfective gastroenteritis and colitis, unspecified: Secondary | ICD-10-CM | POA: Diagnosis not present

## 2015-11-24 ENCOUNTER — Other Ambulatory Visit: Payer: Self-pay | Admitting: Internal Medicine

## 2015-11-24 DIAGNOSIS — Z1389 Encounter for screening for other disorder: Secondary | ICD-10-CM | POA: Diagnosis not present

## 2015-11-24 DIAGNOSIS — E119 Type 2 diabetes mellitus without complications: Secondary | ICD-10-CM | POA: Diagnosis not present

## 2015-11-24 DIAGNOSIS — I10 Essential (primary) hypertension: Secondary | ICD-10-CM | POA: Diagnosis not present

## 2015-11-25 ENCOUNTER — Other Ambulatory Visit: Payer: Self-pay | Admitting: Internal Medicine

## 2015-11-25 ENCOUNTER — Other Ambulatory Visit: Payer: Self-pay

## 2015-11-25 DIAGNOSIS — Z803 Family history of malignant neoplasm of breast: Secondary | ICD-10-CM

## 2015-11-25 DIAGNOSIS — R5381 Other malaise: Secondary | ICD-10-CM

## 2015-11-25 DIAGNOSIS — Z1231 Encounter for screening mammogram for malignant neoplasm of breast: Secondary | ICD-10-CM

## 2015-12-04 DIAGNOSIS — H524 Presbyopia: Secondary | ICD-10-CM | POA: Diagnosis not present

## 2015-12-04 DIAGNOSIS — Z1389 Encounter for screening for other disorder: Secondary | ICD-10-CM | POA: Diagnosis not present

## 2015-12-04 DIAGNOSIS — N819 Female genital prolapse, unspecified: Secondary | ICD-10-CM | POA: Diagnosis not present

## 2015-12-04 DIAGNOSIS — E119 Type 2 diabetes mellitus without complications: Secondary | ICD-10-CM | POA: Diagnosis not present

## 2015-12-04 DIAGNOSIS — H5203 Hypermetropia, bilateral: Secondary | ICD-10-CM | POA: Diagnosis not present

## 2015-12-04 DIAGNOSIS — H52223 Regular astigmatism, bilateral: Secondary | ICD-10-CM | POA: Diagnosis not present

## 2015-12-15 ENCOUNTER — Ambulatory Visit (INDEPENDENT_AMBULATORY_CARE_PROVIDER_SITE_OTHER): Payer: Medicare Other | Admitting: Obstetrics and Gynecology

## 2015-12-15 ENCOUNTER — Encounter: Payer: Self-pay | Admitting: Obstetrics and Gynecology

## 2015-12-15 ENCOUNTER — Encounter: Payer: Self-pay | Admitting: *Deleted

## 2015-12-15 VITALS — BP 142/86 | Ht 68.0 in | Wt 188.0 lb

## 2015-12-15 DIAGNOSIS — K469 Unspecified abdominal hernia without obstruction or gangrene: Secondary | ICD-10-CM | POA: Diagnosis not present

## 2015-12-15 DIAGNOSIS — N816 Rectocele: Secondary | ICD-10-CM | POA: Diagnosis not present

## 2015-12-15 MED ORDER — ESTROGENS, CONJUGATED 0.625 MG/GM VA CREA: 0.5000 g | TOPICAL_CREAM | Freq: Every day | VAGINAL | Status: DC

## 2015-12-15 MED ORDER — PHENAZOPYRIDINE HCL 200 MG PO TABS: 200.0000 mg | ORAL_TABLET | Freq: Three times a day (TID) | ORAL | Status: DC | PRN

## 2015-12-15 NOTE — Progress Notes (Signed)
Patient ID: [name], female   DOB: 1946/08/03, 70 y.o.   MRN: [id number] .   [name]  Patient name: [name] MRN [id number]  Date of birth: 08/01/1946  CC & HPI:  [name] is a 70 y.o. female presenting today for prolapse bulge at introitus. Had episode of severe LAP in hosp recently , came home constipated and had to disempact self.  no SUI  ROS:  /p vg hyst x 30+ yr. Not sexually active. husb has spent their 401k money   Pertinent History Reviewed:   Reviewed: Significant for epi Medical         Past Medical History  Diagnosis Date  . Hypertension   . Kidney tumor   . Anginal pain ([place])   . Diabetes mellitus without complication ([place])   . Depression   . Hyperlipidemia   . Obesity                               Surgical Hx:    Past Surgical History  Procedure Laterality Date  . Tonsillectomy    . Hernia repair    . Embolization    . Cardiac catheterization  2009    normal coronary arteries, preserved LVEF 60%  . Colonoscopy  2004    Dr. Collene Mares: hemorrhoids, diverticulosis, some stool present and small lesions could have been missed. repeat colonoscopy in five years.   . Cholecystectomy  2006    Dr. Hulen Skains.  . Colonoscopy  2016    Dr. Collene Mares: unremarkable per patient  . Abdominal hysterectomy     Medications: Reviewed & Updated - see associated section                       Current outpatient prescriptions:  .  acyclovir (ZOVIRAX) 400 MG tablet, Take 400 mg by mouth 2 (two) times daily. , Disp: , Rfl:  .  amLODipine (NORVASC) 5 MG tablet, Take 5 mg by mouth daily., Disp: , Rfl:  .  aspirin 81 MG tablet, Take 81 mg by mouth daily., Disp: , Rfl:  .  atorvastatin (LIPITOR) 20 MG tablet, Take 20 mg by mouth daily., Disp: , Rfl:  .  lisinopril (PRINIVIL,ZESTRIL) 40 MG tablet, Take 40 mg by mouth daily., Disp: , Rfl:  .  metFORMIN (GLUCOPHAGE-XR) 500 MG 24 hr tablet, Take 500 mg by mouth daily with breakfast., Disp: , Rfl:  .   phenazopyridine (PYRIDIUM) 200 MG tablet, Take 1 tablet (200 mg total) by mouth 3 (three) times daily as needed for pain., Disp: 10 tablet, Rfl: 2 .  phentermine 37.5 MG capsule, Take 37.5 mg by mouth every morning., Disp: , Rfl:  .  traZODone (DESYREL) 150 MG tablet, Take 150 mg by mouth at bedtime., Disp: , Rfl:  .  conjugated estrogens (PREMARIN) vaginal cream, Place [id number] Applicatorfuls vaginally at bedtime. X 2 weeks then 3x weekly, Disp: 42.5 g, Rfl: 3   Social History: Reviewed -  reports that she has never smoked. She has never used smokeless tobacco.  Objective Findings:  Vitals: Blood pressure 142/86, height 5\' 8"  (1.727 m), weight 85.276 kg (188 lb).  Physical Examination: General appearance - alert, well appearing, and in no distress and oriented to person, place, and time Mental status - alert, oriented to person, place, and time, normal mood, behavior, speech, dress, motor activity, and thought processes Abdomen - soft, nontender, nondistended,  no masses or organomegaly Pelvic - VULVA: normal appearing vulva with no masses, tenderness or lesions, VAGINA: normal appearing vagina with normal color and discharge, no lesions, atrophic, PELVIC FLOOR EXAM: rectocele present to introitus which is not enlarged, enterocele ppresent at upper rectocele, vaginal prolapse moderate descent of cuff, CERVIX: surgically absent, UTERUS: surgically absent, vaginal cuff well healed, ADNEXA: normal adnexa in size, nontender and no masses, RECTAL: rectocele noted to intoritus, no palpable internal organs   Assessment & Plan:   A:  1. Vaginal vault prolapse 2 rectocele 3 atrophic vaginitis 4 sterile neglected marriage, pt feels trapped. Options discussed.  P:  1. rx premarin vc 2 rx refil pyridium 3. F/u 4 wk to discuss surgical options 4 pt may wish to discuss options of living by self.

## 2015-12-15 NOTE — Progress Notes (Signed)
Patient ID: [name], female   DOB: 04-05-1946, 70 y.o.   MRN: [id number] Pt here today for growth on her bottom. Pt states that she found the area on the 1st or 2nd of February. Pt states that there has been no change in the area at all.

## 2015-12-28 ENCOUNTER — Ambulatory Visit: Payer: Medicare Other | Admitting: Gastroenterology

## 2015-12-29 DIAGNOSIS — G894 Chronic pain syndrome: Secondary | ICD-10-CM | POA: Diagnosis not present

## 2015-12-29 DIAGNOSIS — M1991 Primary osteoarthritis, unspecified site: Secondary | ICD-10-CM | POA: Diagnosis not present

## 2015-12-29 DIAGNOSIS — Z1389 Encounter for screening for other disorder: Secondary | ICD-10-CM | POA: Diagnosis not present

## 2015-12-29 DIAGNOSIS — E119 Type 2 diabetes mellitus without complications: Secondary | ICD-10-CM | POA: Diagnosis not present

## 2015-12-29 DIAGNOSIS — E663 Overweight: Secondary | ICD-10-CM | POA: Diagnosis not present

## 2015-12-31 ENCOUNTER — Other Ambulatory Visit: Payer: Self-pay | Admitting: Internal Medicine

## 2015-12-31 DIAGNOSIS — M8580 Other specified disorders of bone density and structure, unspecified site: Secondary | ICD-10-CM

## 2016-01-12 ENCOUNTER — Ambulatory Visit: Payer: Medicare Other | Admitting: Obstetrics and Gynecology

## 2016-01-13 ENCOUNTER — Ambulatory Visit: Payer: Medicare Other | Admitting: Obstetrics and Gynecology

## 2016-01-22 ENCOUNTER — Ambulatory Visit
Admission: RE | Admit: 2016-01-22 | Discharge: 2016-01-22 | Disposition: A | Discharge status: Home / Self Care | Payer: Medicare Other | Source: Ambulatory Visit | Attending: Internal Medicine | Admitting: Internal Medicine

## 2016-01-22 ENCOUNTER — Ambulatory Visit
Admission: RE | Admit: 2016-01-22 | Discharge: 2016-01-22 | Disposition: A | Discharge status: Home / Self Care | Payer: Medicare Other | Source: Ambulatory Visit

## 2016-01-22 DIAGNOSIS — M8580 Other specified disorders of bone density and structure, unspecified site: Secondary | ICD-10-CM

## 2016-01-22 DIAGNOSIS — Z780 Asymptomatic menopausal state: Secondary | ICD-10-CM | POA: Diagnosis not present

## 2016-01-22 DIAGNOSIS — Z803 Family history of malignant neoplasm of breast: Secondary | ICD-10-CM

## 2016-01-22 DIAGNOSIS — M85851 Other specified disorders of bone density and structure, right thigh: Secondary | ICD-10-CM | POA: Diagnosis not present

## 2016-01-22 DIAGNOSIS — Z1231 Encounter for screening mammogram for malignant neoplasm of breast: Secondary | ICD-10-CM

## 2016-01-28 DIAGNOSIS — K5900 Constipation, unspecified: Secondary | ICD-10-CM | POA: Diagnosis not present

## 2016-01-28 DIAGNOSIS — N952 Postmenopausal atrophic vaginitis: Secondary | ICD-10-CM | POA: Diagnosis not present

## 2016-01-28 DIAGNOSIS — R351 Nocturia: Secondary | ICD-10-CM | POA: Diagnosis not present

## 2016-01-28 DIAGNOSIS — N816 Rectocele: Secondary | ICD-10-CM | POA: Diagnosis not present

## 2016-01-28 DIAGNOSIS — N949 Unspecified condition associated with female genital organs and menstrual cycle: Secondary | ICD-10-CM | POA: Diagnosis not present

## 2016-01-28 DIAGNOSIS — R150 Incomplete defecation: Secondary | ICD-10-CM | POA: Diagnosis not present

## 2016-01-28 DIAGNOSIS — N812 Incomplete uterovaginal prolapse: Secondary | ICD-10-CM | POA: Diagnosis not present

## 2016-01-28 DIAGNOSIS — R319 Hematuria, unspecified: Secondary | ICD-10-CM | POA: Diagnosis not present

## 2016-02-03 ENCOUNTER — Ambulatory Visit: Payer: Medicare Other | Admitting: Obstetrics and Gynecology

## 2016-02-09 DIAGNOSIS — Z1389 Encounter for screening for other disorder: Secondary | ICD-10-CM | POA: Diagnosis not present

## 2016-02-09 DIAGNOSIS — E119 Type 2 diabetes mellitus without complications: Secondary | ICD-10-CM | POA: Diagnosis not present

## 2016-02-09 DIAGNOSIS — I10 Essential (primary) hypertension: Secondary | ICD-10-CM | POA: Diagnosis not present

## 2016-02-26 ENCOUNTER — Telehealth: Payer: Self-pay | Admitting: Cardiovascular Disease

## 2016-02-26 NOTE — Telephone Encounter (Signed)
Closed encounter °

## 2016-02-27 DIAGNOSIS — R0682 Tachypnea, not elsewhere classified: Secondary | ICD-10-CM | POA: Diagnosis not present

## 2016-03-01 ENCOUNTER — Ambulatory Visit: Payer: Medicare Other | Admitting: Cardiovascular Disease

## 2016-03-02 ENCOUNTER — Encounter: Payer: Self-pay | Admitting: Cardiovascular Disease

## 2016-03-02 ENCOUNTER — Ambulatory Visit (INDEPENDENT_AMBULATORY_CARE_PROVIDER_SITE_OTHER): Payer: Medicare Other | Admitting: Cardiovascular Disease

## 2016-03-02 VITALS — BP 152/76 | HR 83 | Ht 68.0 in | Wt 181.0 lb

## 2016-03-02 DIAGNOSIS — R079 Chest pain, unspecified: Secondary | ICD-10-CM | POA: Diagnosis not present

## 2016-03-02 DIAGNOSIS — E785 Hyperlipidemia, unspecified: Secondary | ICD-10-CM | POA: Diagnosis not present

## 2016-03-02 DIAGNOSIS — R0789 Other chest pain: Secondary | ICD-10-CM | POA: Diagnosis not present

## 2016-03-02 DIAGNOSIS — E1169 Type 2 diabetes mellitus with other specified complication: Secondary | ICD-10-CM | POA: Insufficient documentation

## 2016-03-02 NOTE — Assessment & Plan Note (Signed)
Patient presents today for evaluation of atypical chest pain.Approximately a month. It occurs in the morning last for several hours. There is some radiation to the upper extremities with some shortness of breath. Of note she has had 2 cardiac catheterizations in the past by Dr. Doreatha Lew which have revealed normal coronary arteries. I'm going to get an exercise Myoview stress test to risk stratify her.

## 2016-03-02 NOTE — Assessment & Plan Note (Signed)
History of hyperlipidemia on statin therapy with recent lipid profile performed 02/09/16 revealed anterolateral 205, LDL 135 and HDL of 45. She does admit to not having taken her medicines according to schedule recently.

## 2016-03-02 NOTE — Patient Instructions (Signed)
Medication Instructions:  Your physician recommends that you continue on your current medications as directed. Please refer to the Current Medication list given to you today.   Labwork: none  Testing/Procedures: Your physician has requested that you have en exercise stress myoview. For further information please visit [email or web address]. Please follow instruction sheet, as given.   Follow-Up: Your physician recommends that you schedule a follow-up appointment in: [place].   Any Other Special Instructions Will Be Listed Below (If Applicable).     If you need a refill on your cardiac medications before your next appointment, please call your pharmacy.

## 2016-03-02 NOTE — Progress Notes (Signed)
03/02/2016 AISLING KLEPPINGER   1946-04-04  [id number]  Primary Physician Florina Ou, MD Primary Cardiologist: Lorretta Harp MD Renae Gloss   HPI:  [name] is a delightful 70 year old mildly overweight married Caucasian female mother of 2, grandmother and 3 grandchildren who is referred by Dr. Gerarda Fraction for cardiovascular evaluation because of chest pain. She is retired from working at [employer] in [employer], art for 23 years. Risk factors include treated hypertension and hyperlipidemia. She has never had a heart attack or stroke. She has had 2 cardiac catheterizations remotely by Dr. Dennison Bulla that were essentially normal. She also has a history of panic attacks and anxiety.   Current Outpatient Prescriptions  Medication Sig Dispense Refill  . acyclovir (ZOVIRAX) 400 MG tablet Take 400 mg by mouth 2 (two) times daily.     Marland Kitchen amLODipine (NORVASC) 5 MG tablet Take 5 mg by mouth daily.    Marland Kitchen aspirin 81 MG tablet Take 81 mg by mouth daily.    Marland Kitchen atorvastatin (LIPITOR) 20 MG tablet Take 20 mg by mouth daily.    Marland Kitchen conjugated estrogens (PREMARIN) vaginal cream Place [id number] Applicatorfuls vaginally at bedtime. X 2 weeks then 3x weekly 42.5 g 3  . lisinopril (PRINIVIL,ZESTRIL) 40 MG tablet Take 40 mg by mouth daily.    . phenazopyridine (PYRIDIUM) 200 MG tablet Take 1 tablet (200 mg total) by mouth 3 (three) times daily as needed for pain. 10 tablet 2  . phentermine 37.5 MG capsule Take 37.5 mg by mouth every morning.    . traZODone (DESYREL) 150 MG tablet Take 150 mg by mouth at bedtime.     No current facility-administered medications for this visit.    No Known Allergies  Social History   Social History  . Marital Status: Married    Spouse Name: N/A  . Number of Children: N/A  . Years of Education: N/A   Occupational History  . Not on file.   Social History Main Topics  . Smoking status: Never Smoker   . Smokeless tobacco: Never Used  . Alcohol Use: No  . Drug  Use: No  . Sexual Activity: Yes    Birth Control/ Protection: Post-menopausal, Surgical   Other Topics Concern  . Not on file   Social History Narrative     Review of Systems: General: negative for chills, fever, night sweats or weight changes.  Cardiovascular: negative for chest pain, dyspnea on exertion, edema, orthopnea, palpitations, paroxysmal nocturnal dyspnea or shortness of breath Dermatological: negative for rash Respiratory: negative for cough or wheezing Urologic: negative for hematuria Abdominal: negative for nausea, vomiting, diarrhea, bright red blood per rectum, melena, or hematemesis Neurologic: negative for visual changes, syncope, or dizziness All other systems reviewed and are otherwise negative except as noted above.    Blood pressure 152/76, pulse 83, height 5\' 8"  (1.727 m), weight 181 lb (82.101 kg).  General appearance: alert and no distress Neck: no adenopathy, no carotid bruit, no JVD, supple, symmetrical, trachea midline and thyroid not enlarged, symmetric, no tenderness/mass/nodules Lungs: clear to auscultation bilaterally Heart: regular rate and rhythm, S1, S2 normal, no murmur, click, rub or gallop Extremities: extremities normal, atraumatic, no cyanosis or edema  EKG normal sinus rhythm 83 without ST or T-wave changes. I personally reviewed the EKG  ASSESSMENT AND PLAN:   Hyperlipidemia History of hyperlipidemia on statin therapy with recent lipid profile performed 02/09/16 revealed anterolateral 205, LDL 135 and HDL of 45. She does admit to not having taken  her medicines according to schedule recently.  Atypical chest pain Patient presents today for evaluation of atypical chest pain.Approximately a month. It occurs in the morning last for several hours. There is some radiation to the upper extremities with some shortness of breath. Of note she has had 2 cardiac catheterizations in the past by Dr. Doreatha Lew which have revealed normal coronary arteries.  I'm going to get an exercise Myoview stress test to risk stratify her.      Lorretta Harp MD FACP,FACC,FAHA, [facility] 03/02/2016 2:37 PM

## 2016-03-11 ENCOUNTER — Telehealth (HOSPITAL_COMMUNITY): Payer: Self-pay

## 2016-03-11 NOTE — Telephone Encounter (Signed)
Encounter complete. 

## 2016-03-16 ENCOUNTER — Ambulatory Visit (HOSPITAL_COMMUNITY)
Admission: RE | Admit: 2016-03-16 | Discharge: 2016-03-16 | Disposition: A | Discharge status: Home / Self Care | Payer: Medicare Other | Source: Ambulatory Visit | Attending: Cardiovascular Disease | Admitting: Cardiovascular Disease

## 2016-03-16 DIAGNOSIS — Z8249 Family history of ischemic heart disease and other diseases of the circulatory system: Secondary | ICD-10-CM | POA: Insufficient documentation

## 2016-03-16 DIAGNOSIS — E669 Obesity, unspecified: Secondary | ICD-10-CM | POA: Insufficient documentation

## 2016-03-16 DIAGNOSIS — R0609 Other forms of dyspnea: Secondary | ICD-10-CM | POA: Diagnosis not present

## 2016-03-16 DIAGNOSIS — Z6827 Body mass index (BMI) 27.0-27.9, adult: Secondary | ICD-10-CM | POA: Insufficient documentation

## 2016-03-16 DIAGNOSIS — I10 Essential (primary) hypertension: Secondary | ICD-10-CM | POA: Insufficient documentation

## 2016-03-16 DIAGNOSIS — R079 Chest pain, unspecified: Secondary | ICD-10-CM | POA: Diagnosis not present

## 2016-03-16 DIAGNOSIS — R5383 Other fatigue: Secondary | ICD-10-CM | POA: Insufficient documentation

## 2016-03-16 DIAGNOSIS — R9439 Abnormal result of other cardiovascular function study: Secondary | ICD-10-CM | POA: Insufficient documentation

## 2016-03-16 DIAGNOSIS — R42 Dizziness and giddiness: Secondary | ICD-10-CM | POA: Insufficient documentation

## 2016-03-16 DIAGNOSIS — E119 Type 2 diabetes mellitus without complications: Secondary | ICD-10-CM | POA: Diagnosis not present

## 2016-03-16 LAB — MYOCARDIAL PERFUSION IMAGING
Estimated workload: 7.1 METS
Exercise duration (min): 6 min
Exercise duration (sec): 41 s
LV dias vol: 125 mL (ref 46–106)
LV sys vol: 65 mL
MPHR: 149 {beats}/min
Peak HR: 130 {beats}/min
Percent HR: 87 %
Percent of predicted max HR: 87 %
RPE: 17
Rest HR: 74 {beats}/min
SDS: 1
SRS: 2
SSS: 3
Stage 1 DBP: 89 mmHg
Stage 1 Grade: 0 %
Stage 1 HR: 78 {beats}/min
Stage 1 SBP: 142 mmHg
Stage 1 Speed: 0 mph
Stage 2 Grade: 0 %
Stage 2 HR: 80 {beats}/min
Stage 2 Speed: 0.8 mph
Stage 3 Grade: 0 %
Stage 3 HR: 80 {beats}/min
Stage 3 Speed: 1 mph
Stage 4 DBP: 55 mmHg
Stage 4 Grade: 10 %
Stage 4 HR: 114 {beats}/min
Stage 4 SBP: 149 mmHg
Stage 4 Speed: 1.7 mph
Stage 5 DBP: 85 mmHg
Stage 5 Grade: 12 %
Stage 5 HR: 126 {beats}/min
Stage 5 SBP: 175 mmHg
Stage 5 Speed: 2.5 mph
Stage 6 Grade: 14 %
Stage 6 HR: 129 {beats}/min
Stage 6 Speed: 3.3 mph
Stage 7 Grade: 14 %
Stage 7 HR: 130 {beats}/min
Stage 7 Speed: 2.1 mph
Stage 8 DBP: 102 mmHg
Stage 8 Grade: 0 %
Stage 8 HR: 114 {beats}/min
Stage 8 SBP: 198 mmHg
Stage 8 Speed: 0 mph
Stage 9 DBP: 85 mmHg
Stage 9 Grade: 0 %
Stage 9 HR: 84 {beats}/min
Stage 9 SBP: 148 mmHg
Stage 9 Speed: 0 mph
TID: 1.15

## 2016-03-16 MED ORDER — TECHNETIUM TC 99M TETROFOSMIN IV KIT
29.8000 | PACK | Freq: Once | INTRAVENOUS | Status: AC | PRN
  Administered 2016-03-16: 29.8 via INTRAVENOUS
  Filled 2016-03-16: qty 30

## 2016-03-16 MED ORDER — TECHNETIUM TC 99M TETROFOSMIN IV KIT
10.8000 | PACK | Freq: Once | INTRAVENOUS | Status: AC | PRN
  Administered 2016-03-16: 10.8 via INTRAVENOUS
  Filled 2016-03-16: qty 11

## 2016-03-22 DIAGNOSIS — G894 Chronic pain syndrome: Secondary | ICD-10-CM | POA: Diagnosis not present

## 2016-03-22 DIAGNOSIS — I10 Essential (primary) hypertension: Secondary | ICD-10-CM | POA: Diagnosis not present

## 2016-03-22 DIAGNOSIS — M1991 Primary osteoarthritis, unspecified site: Secondary | ICD-10-CM | POA: Diagnosis not present

## 2016-03-22 DIAGNOSIS — Z23 Encounter for immunization: Secondary | ICD-10-CM | POA: Diagnosis not present

## 2016-03-22 DIAGNOSIS — E119 Type 2 diabetes mellitus without complications: Secondary | ICD-10-CM | POA: Diagnosis not present

## 2016-03-25 ENCOUNTER — Telehealth: Payer: Self-pay | Admitting: Cardiovascular Disease

## 2016-03-25 NOTE — Telephone Encounter (Signed)
Pt returned call to [facility]--pls call back

## 2016-03-25 NOTE — Telephone Encounter (Signed)
Returned call to pt. Results called to pt. Pt verbalized understanding.

## 2016-03-29 DIAGNOSIS — Z23 Encounter for immunization: Secondary | ICD-10-CM | POA: Diagnosis not present

## 2016-03-29 DIAGNOSIS — S81802A Unspecified open wound, left lower leg, initial encounter: Secondary | ICD-10-CM | POA: Diagnosis not present

## 2016-04-14 DIAGNOSIS — N816 Rectocele: Secondary | ICD-10-CM | POA: Diagnosis not present

## 2016-04-14 DIAGNOSIS — N949 Unspecified condition associated with female genital organs and menstrual cycle: Secondary | ICD-10-CM | POA: Diagnosis not present

## 2016-04-14 DIAGNOSIS — N8110 Cystocele, unspecified: Secondary | ICD-10-CM | POA: Diagnosis not present

## 2016-04-14 DIAGNOSIS — N993 Prolapse of vaginal vault after hysterectomy: Secondary | ICD-10-CM | POA: Diagnosis not present

## 2016-05-17 DIAGNOSIS — M549 Dorsalgia, unspecified: Secondary | ICD-10-CM | POA: Diagnosis not present

## 2016-05-17 DIAGNOSIS — Z9049 Acquired absence of other specified parts of digestive tract: Secondary | ICD-10-CM | POA: Diagnosis not present

## 2016-05-17 DIAGNOSIS — Z7982 Long term (current) use of aspirin: Secondary | ICD-10-CM | POA: Diagnosis not present

## 2016-05-17 DIAGNOSIS — I10 Essential (primary) hypertension: Secondary | ICD-10-CM | POA: Diagnosis not present

## 2016-05-17 DIAGNOSIS — E785 Hyperlipidemia, unspecified: Secondary | ICD-10-CM | POA: Diagnosis not present

## 2016-05-17 DIAGNOSIS — E1140 Type 2 diabetes mellitus with diabetic neuropathy, unspecified: Secondary | ICD-10-CM | POA: Diagnosis not present

## 2016-05-17 DIAGNOSIS — M25552 Pain in left hip: Secondary | ICD-10-CM | POA: Diagnosis not present

## 2016-05-17 DIAGNOSIS — Z79890 Hormone replacement therapy (postmenopausal): Secondary | ICD-10-CM | POA: Diagnosis not present

## 2016-05-17 DIAGNOSIS — M069 Rheumatoid arthritis, unspecified: Secondary | ICD-10-CM | POA: Diagnosis not present

## 2016-05-17 DIAGNOSIS — Z79899 Other long term (current) drug therapy: Secondary | ICD-10-CM | POA: Diagnosis not present

## 2016-05-17 DIAGNOSIS — N816 Rectocele: Secondary | ICD-10-CM | POA: Diagnosis not present

## 2016-05-17 DIAGNOSIS — G8929 Other chronic pain: Secondary | ICD-10-CM | POA: Diagnosis not present

## 2016-05-17 DIAGNOSIS — K210 Gastro-esophageal reflux disease with esophagitis: Secondary | ICD-10-CM | POA: Diagnosis not present

## 2016-05-17 DIAGNOSIS — D649 Anemia, unspecified: Secondary | ICD-10-CM | POA: Diagnosis not present

## 2016-05-17 DIAGNOSIS — M545 Low back pain: Secondary | ICD-10-CM | POA: Diagnosis not present

## 2016-05-18 DIAGNOSIS — E785 Hyperlipidemia, unspecified: Secondary | ICD-10-CM | POA: Diagnosis not present

## 2016-05-18 DIAGNOSIS — Z7982 Long term (current) use of aspirin: Secondary | ICD-10-CM | POA: Diagnosis not present

## 2016-05-18 DIAGNOSIS — K210 Gastro-esophageal reflux disease with esophagitis: Secondary | ICD-10-CM | POA: Diagnosis not present

## 2016-05-18 DIAGNOSIS — G8929 Other chronic pain: Secondary | ICD-10-CM | POA: Diagnosis not present

## 2016-05-18 DIAGNOSIS — D649 Anemia, unspecified: Secondary | ICD-10-CM | POA: Diagnosis not present

## 2016-05-18 DIAGNOSIS — Z9049 Acquired absence of other specified parts of digestive tract: Secondary | ICD-10-CM | POA: Diagnosis not present

## 2016-05-18 DIAGNOSIS — E1140 Type 2 diabetes mellitus with diabetic neuropathy, unspecified: Secondary | ICD-10-CM | POA: Diagnosis not present

## 2016-05-18 DIAGNOSIS — I10 Essential (primary) hypertension: Secondary | ICD-10-CM | POA: Diagnosis not present

## 2016-05-18 DIAGNOSIS — M545 Low back pain: Secondary | ICD-10-CM | POA: Diagnosis not present

## 2016-05-18 DIAGNOSIS — M549 Dorsalgia, unspecified: Secondary | ICD-10-CM | POA: Diagnosis not present

## 2016-05-18 DIAGNOSIS — M25552 Pain in left hip: Secondary | ICD-10-CM | POA: Diagnosis not present

## 2016-05-18 DIAGNOSIS — Z79890 Hormone replacement therapy (postmenopausal): Secondary | ICD-10-CM | POA: Diagnosis not present

## 2016-05-18 DIAGNOSIS — N816 Rectocele: Secondary | ICD-10-CM | POA: Diagnosis not present

## 2016-05-18 DIAGNOSIS — Z79899 Other long term (current) drug therapy: Secondary | ICD-10-CM | POA: Diagnosis not present

## 2016-05-18 DIAGNOSIS — M069 Rheumatoid arthritis, unspecified: Secondary | ICD-10-CM | POA: Diagnosis not present

## 2016-06-01 DIAGNOSIS — S93504A Unspecified sprain of right lesser toe(s), initial encounter: Secondary | ICD-10-CM | POA: Diagnosis not present

## 2016-06-01 DIAGNOSIS — S92911A Unspecified fracture of right toe(s), initial encounter for closed fracture: Secondary | ICD-10-CM | POA: Diagnosis not present

## 2016-06-02 DIAGNOSIS — Z48816 Encounter for surgical aftercare following surgery on the genitourinary system: Secondary | ICD-10-CM | POA: Diagnosis not present

## 2016-06-02 DIAGNOSIS — M791 Myalgia: Secondary | ICD-10-CM | POA: Diagnosis not present

## 2016-06-02 DIAGNOSIS — Z9889 Other specified postprocedural states: Secondary | ICD-10-CM | POA: Diagnosis not present

## 2016-06-02 DIAGNOSIS — M545 Low back pain: Secondary | ICD-10-CM | POA: Diagnosis not present

## 2016-06-15 ENCOUNTER — Ambulatory Visit: Payer: Medicare Other | Admitting: Cardiovascular Disease

## 2016-06-27 DIAGNOSIS — Z1389 Encounter for screening for other disorder: Secondary | ICD-10-CM | POA: Diagnosis not present

## 2016-06-27 DIAGNOSIS — G894 Chronic pain syndrome: Secondary | ICD-10-CM | POA: Diagnosis not present

## 2016-06-30 DIAGNOSIS — Z48816 Encounter for surgical aftercare following surgery on the genitourinary system: Secondary | ICD-10-CM | POA: Diagnosis not present

## 2016-06-30 DIAGNOSIS — I10 Essential (primary) hypertension: Secondary | ICD-10-CM | POA: Diagnosis not present

## 2016-06-30 DIAGNOSIS — E119 Type 2 diabetes mellitus without complications: Secondary | ICD-10-CM | POA: Diagnosis not present

## 2016-09-26 DIAGNOSIS — Z1389 Encounter for screening for other disorder: Secondary | ICD-10-CM | POA: Diagnosis not present

## 2016-09-26 DIAGNOSIS — G894 Chronic pain syndrome: Secondary | ICD-10-CM | POA: Diagnosis not present

## 2016-09-26 DIAGNOSIS — M47816 Spondylosis without myelopathy or radiculopathy, lumbar region: Secondary | ICD-10-CM | POA: Diagnosis not present

## 2016-09-26 DIAGNOSIS — I10 Essential (primary) hypertension: Secondary | ICD-10-CM | POA: Diagnosis not present

## 2016-09-26 DIAGNOSIS — E119 Type 2 diabetes mellitus without complications: Secondary | ICD-10-CM | POA: Diagnosis not present

## 2016-09-26 DIAGNOSIS — M1991 Primary osteoarthritis, unspecified site: Secondary | ICD-10-CM | POA: Diagnosis not present

## 2016-10-20 DIAGNOSIS — Z48816 Encounter for surgical aftercare following surgery on the genitourinary system: Secondary | ICD-10-CM | POA: Diagnosis not present

## 2016-10-20 DIAGNOSIS — Z9889 Other specified postprocedural states: Secondary | ICD-10-CM | POA: Diagnosis not present

## 2016-10-20 DIAGNOSIS — Z09 Encounter for follow-up examination after completed treatment for conditions other than malignant neoplasm: Secondary | ICD-10-CM | POA: Diagnosis not present

## 2016-11-08 DIAGNOSIS — H0015 Chalazion left lower eyelid: Secondary | ICD-10-CM | POA: Diagnosis not present

## 2016-11-10 DIAGNOSIS — H04022 Chronic dacryoadenitis, left lacrimal gland: Secondary | ICD-10-CM | POA: Diagnosis not present

## 2016-11-10 DIAGNOSIS — H04412 Chronic dacryocystitis of left lacrimal passage: Secondary | ICD-10-CM | POA: Diagnosis not present

## 2016-11-10 DIAGNOSIS — H0015 Chalazion left lower eyelid: Secondary | ICD-10-CM | POA: Diagnosis not present

## 2017-01-04 DIAGNOSIS — J329 Chronic sinusitis, unspecified: Secondary | ICD-10-CM | POA: Diagnosis not present

## 2017-01-04 DIAGNOSIS — Z1389 Encounter for screening for other disorder: Secondary | ICD-10-CM | POA: Diagnosis not present

## 2017-01-04 DIAGNOSIS — M1991 Primary osteoarthritis, unspecified site: Secondary | ICD-10-CM | POA: Diagnosis not present

## 2017-01-04 DIAGNOSIS — J9801 Acute bronchospasm: Secondary | ICD-10-CM | POA: Diagnosis not present

## 2017-01-04 DIAGNOSIS — G894 Chronic pain syndrome: Secondary | ICD-10-CM | POA: Diagnosis not present

## 2017-02-07 ENCOUNTER — Other Ambulatory Visit: Payer: Self-pay | Admitting: Internal Medicine

## 2017-02-07 DIAGNOSIS — Z1231 Encounter for screening mammogram for malignant neoplasm of breast: Secondary | ICD-10-CM

## 2017-02-08 ENCOUNTER — Ambulatory Visit
Admission: RE | Admit: 2017-02-08 | Discharge: 2017-02-08 | Disposition: A | Discharge status: Home / Self Care | Payer: Medicare Other | Source: Ambulatory Visit | Attending: Internal Medicine | Admitting: Internal Medicine

## 2017-02-08 ENCOUNTER — Other Ambulatory Visit: Payer: Self-pay | Admitting: Internal Medicine

## 2017-02-08 DIAGNOSIS — N6320 Unspecified lump in the left breast, unspecified quadrant: Secondary | ICD-10-CM

## 2017-02-08 DIAGNOSIS — R928 Other abnormal and inconclusive findings on diagnostic imaging of breast: Secondary | ICD-10-CM | POA: Diagnosis not present

## 2017-02-08 DIAGNOSIS — Z1231 Encounter for screening mammogram for malignant neoplasm of breast: Secondary | ICD-10-CM

## 2017-02-08 DIAGNOSIS — N6310 Unspecified lump in the right breast, unspecified quadrant: Secondary | ICD-10-CM

## 2017-02-08 DIAGNOSIS — N6489 Other specified disorders of breast: Secondary | ICD-10-CM | POA: Diagnosis not present

## 2017-02-11 IMAGING — CT CT ABD-PELV W/ CM
2 of 5 series · 15 of 46 positions shown, 17 images · IV contrast (Omnipaque 300)
Comparison: 01/04/2005 ultrasound and report from multiple prior
studies

ADDENDUM:
Comparison is now made with CT angio chest and upper abdomen
performed 01/04/2005. When compared with the prior study, the lesion
in the upper pole of the left kidney likely represents scarring from
the previous angiomyolipoma. The post embolization changes otherwise
in the left kidney are stable.
CLINICAL DATA: Oral contrast per protocol; severe abdominal pain
and spasms since 11/08/15; bloating; hx kidney tumor;
diabetes-metformin sheet given.

Based on previous exams, patient has a history of hemorrhage from a
left renal angiomyolipoma and subsequent embolization.
EXAM:
CT ABDOMEN AND PELVIS WITH CONTRAST
TECHNIQUE: Multidetector CT imaging of the abdomen and pelvis was performed
using the standard protocol following bolus administration of
intravenous contrast.
CONTRAST:  100mL OMNIPAQUE IOHEXOL 300 MG/ML  SOLN

[Series 2: abd_pel_with 5.0 b40f · axial · 0.75mm/px · z∈[-457,-27]mm · 12 of 98 slices shown, 14 images]
[im 6/98  soft-tissue]
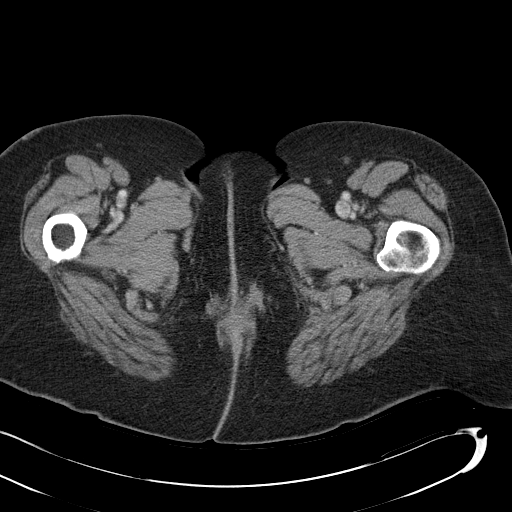
[im 6/98  bone]
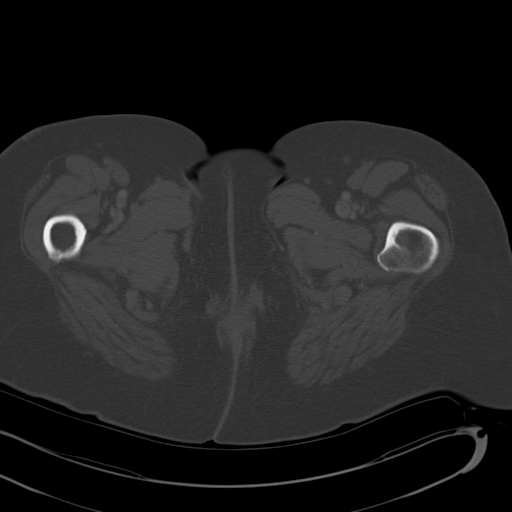
[im 18/98  soft-tissue]
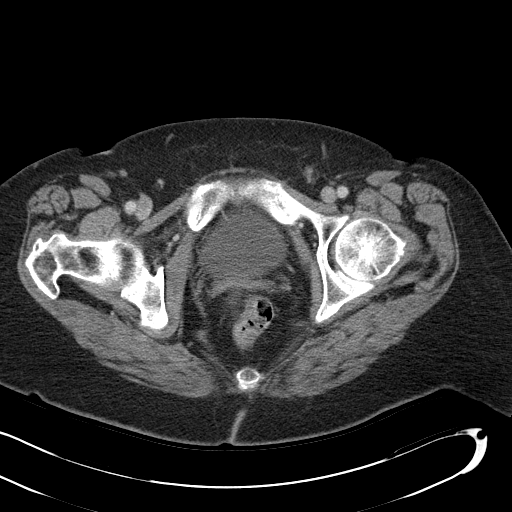
[im 23/98  soft-tissue]
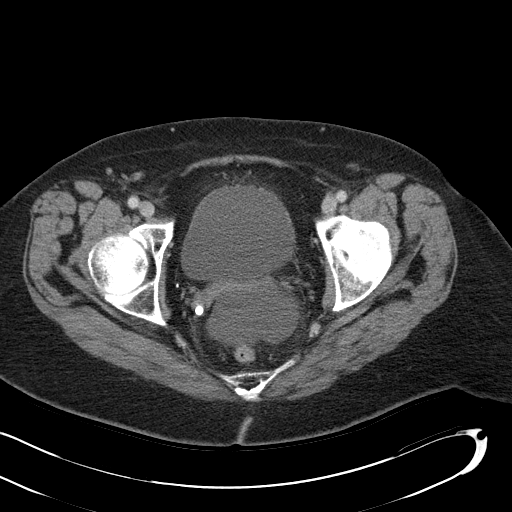
[im 29/98  soft-tissue]
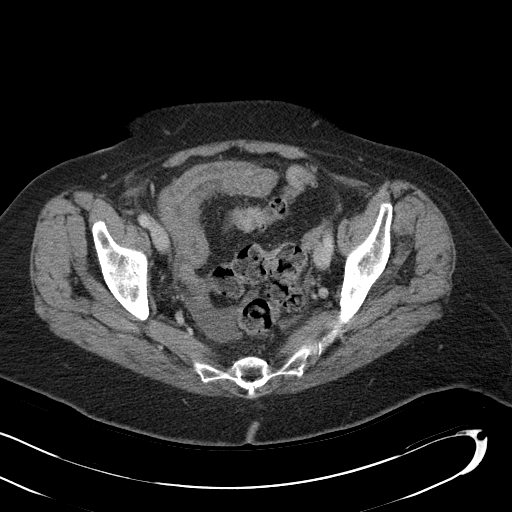
[im 40/98  soft-tissue]
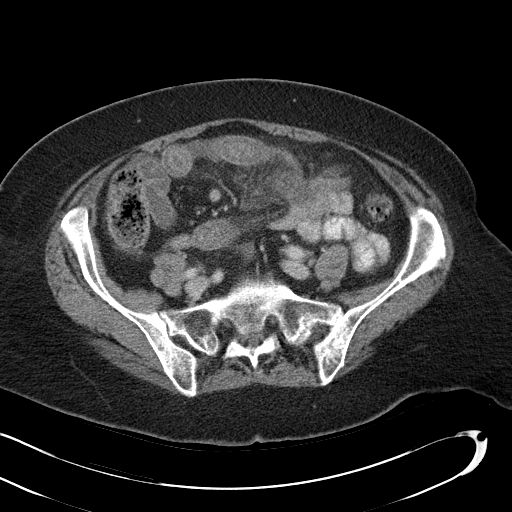
[im 46/98  soft-tissue]
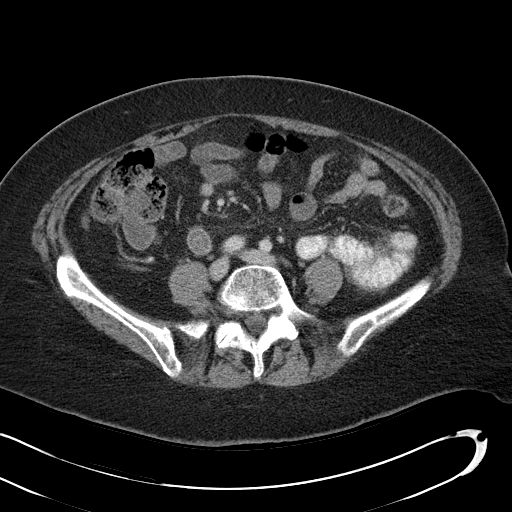
[im 52/98  soft-tissue]
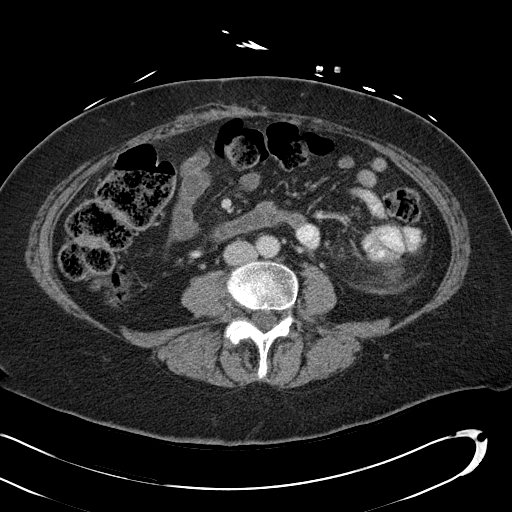
[im 63/98  soft-tissue]
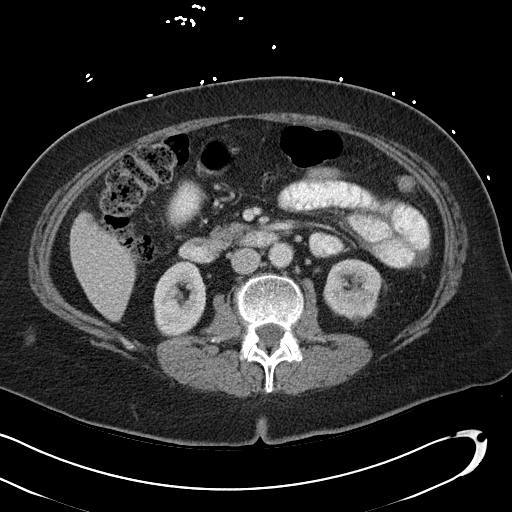
[im 69/98  soft-tissue]
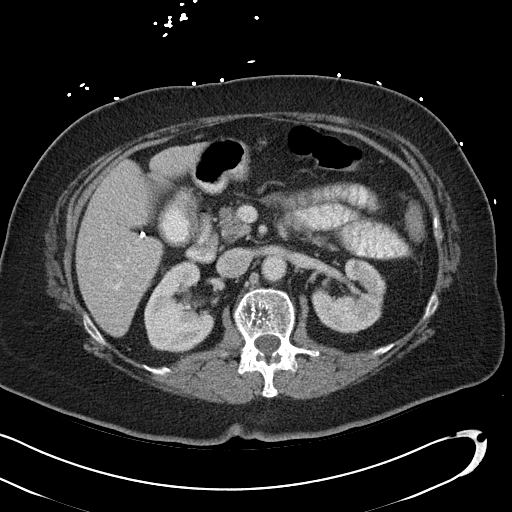
[im 69/98  bone]
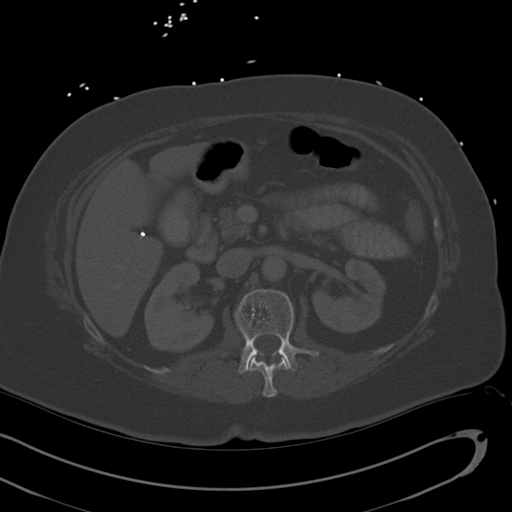
[im 75/98  soft-tissue]
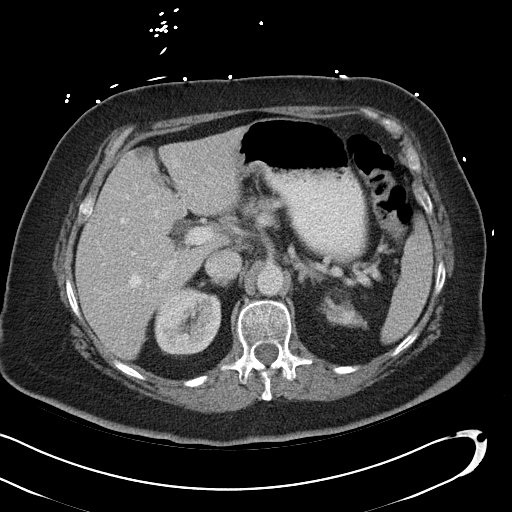
[im 86/98  soft-tissue]
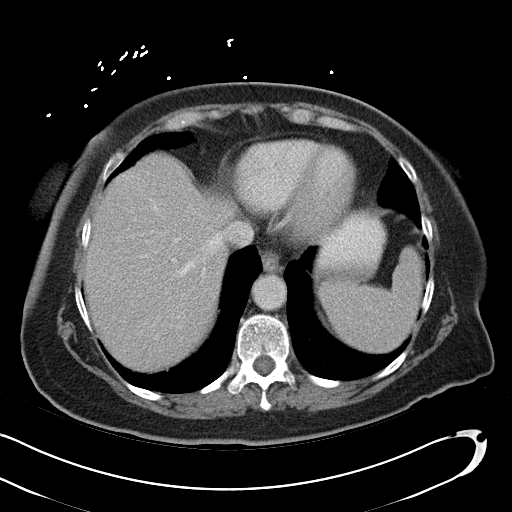
[im 92/98  soft-tissue]
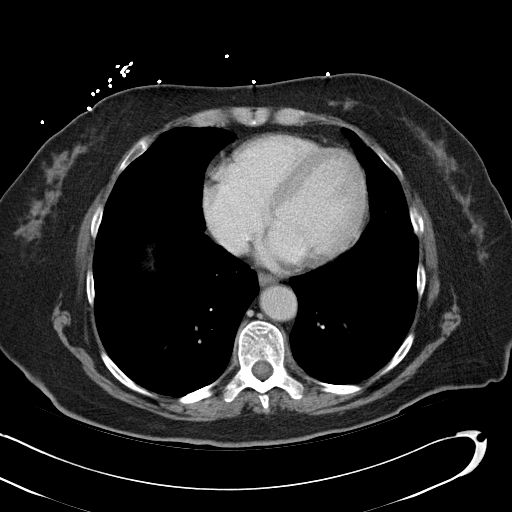

[Series 5: abd_pel_with 3.0 spo · coronal · 0.82mm/px · 3 of 82 slices shown]
[im 28/82  soft-tissue]
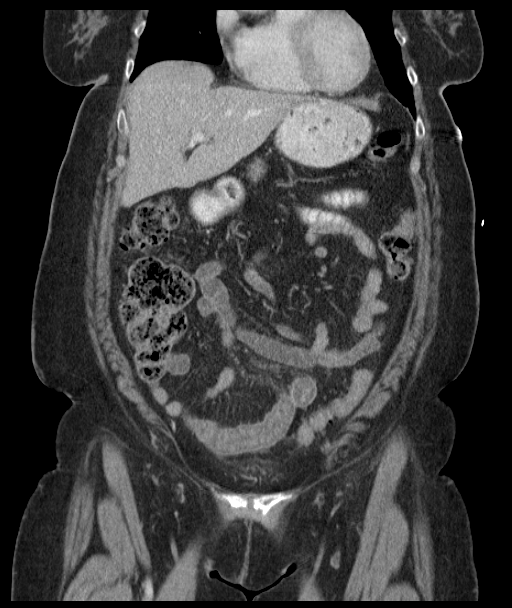
[im 37/82  soft-tissue]
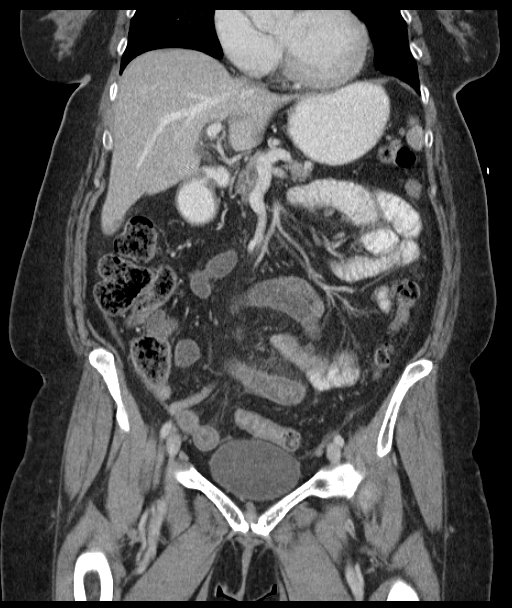
[im 46/82  soft-tissue]
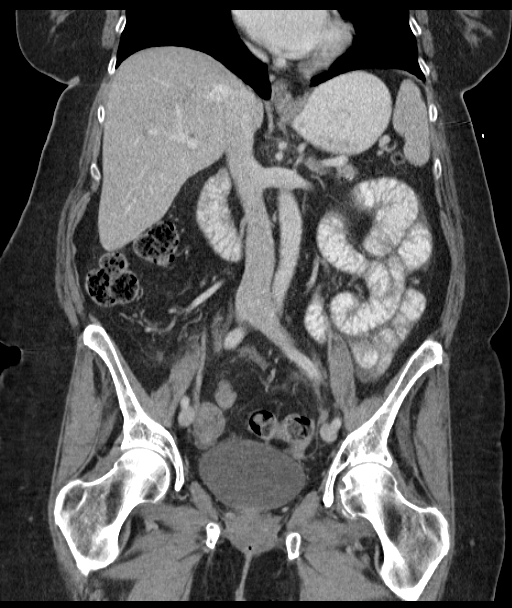

[15 of 46 positions shown; findings below may reference images not displayed]

FINDINGS: Lower chest: No pulmonary nodules, pleural effusions, or
infiltrates. Heart size is normal. No imaged pericardial effusion or
significant coronary artery calcifications.

Upper abdomen: Right benign adrenal adenoma is present. Status post
cholecystectomy. There is focal fatty infiltration of the liver
adjacent to the falciform ligament. No focal liver lesion
identified. Spleen has a normal appearance.

There is a rim enhancing lesion involving the upper pole of the left
kidney. Lesion is exophytic and measures 1.7 cm in diameter and
water attenuation in density. There is a 1.5 cm fatty attenuation
within the anterior upper pole of the left kidney, consistent with
angiomyolipoma. There is a defect in the upper pole the left kidney,
likely secondary to scarring. Coils are identified within the hilar
portion of the upper pole of the left kidney following previous
embolization of hemorrhagic left renal lesion. Low-attenuation
lesions in the left kidney are favored to be cysts. There is no
hydronephrosis or hydroureter.

Gastrointestinal tract: Stomach has a normal appearance. There is
dilatation of the proximal jejunal loops. The proximal and mid ileal
loops are markedly abnormal, with wall thickening and mesenteric
edema. The terminal ileal loops are normal in caliber. There is no
evidence for bowel obstruction. There is significant stool within
the sigmoid colon. Scattered colonic diverticula are present.

Pelvis: Uterus is absent. There is moderate free pelvic fluid. No
adnexal mass.

Retroperitoneum: Atherosclerotic calcification of the abdominal
aorta. No retroperitoneal or mesenteric adenopathy.

Abdominal wall: Supra umbilical surgical changes.

Osseous structures: No suspicious lytic or blastic lesions are
identified. L2 hemangioma noted.
IMPRESSION: 1. Marked abnormality of the proximal and mid to distal ileum.
Considerations include inflammatory and infectious abnormalities.
2. Rim enhancing lesion in the upper pole of the left kidney
warrants further evaluation. Although this could represent a
residual angiomyolipoma, the CT density measurements are
indeterminate. Renal lesion protocol MRI is recommended. MRI should
be performed when the patient is clinically stable and able to
follow breath holding instructions (usually best performed on an
outpatient basis).
3. Benign right adrenal adenoma.
4. Hepatic steatosis.
5. Status post cholecystectomy.

## 2017-05-01 DIAGNOSIS — N390 Urinary tract infection, site not specified: Secondary | ICD-10-CM | POA: Diagnosis not present

## 2017-05-01 DIAGNOSIS — G894 Chronic pain syndrome: Secondary | ICD-10-CM | POA: Diagnosis not present

## 2017-05-01 DIAGNOSIS — N342 Other urethritis: Secondary | ICD-10-CM | POA: Diagnosis not present

## 2017-05-01 DIAGNOSIS — Z1389 Encounter for screening for other disorder: Secondary | ICD-10-CM | POA: Diagnosis not present

## 2017-05-08 DIAGNOSIS — Z1389 Encounter for screening for other disorder: Secondary | ICD-10-CM | POA: Diagnosis not present

## 2017-05-08 DIAGNOSIS — N390 Urinary tract infection, site not specified: Secondary | ICD-10-CM | POA: Diagnosis not present

## 2017-05-08 DIAGNOSIS — Z634 Disappearance and death of family member: Secondary | ICD-10-CM | POA: Diagnosis not present

## 2017-05-08 DIAGNOSIS — Z0000 Encounter for general adult medical examination without abnormal findings: Secondary | ICD-10-CM | POA: Diagnosis not present

## 2017-05-08 DIAGNOSIS — E748 Other specified disorders of carbohydrate metabolism: Secondary | ICD-10-CM | POA: Diagnosis not present

## 2017-07-18 DIAGNOSIS — T1490XA Injury, unspecified, initial encounter: Secondary | ICD-10-CM | POA: Diagnosis not present

## 2017-07-18 DIAGNOSIS — G894 Chronic pain syndrome: Secondary | ICD-10-CM | POA: Diagnosis not present

## 2017-07-18 DIAGNOSIS — S60811A Abrasion of right wrist, initial encounter: Secondary | ICD-10-CM | POA: Diagnosis not present

## 2017-07-18 DIAGNOSIS — Z23 Encounter for immunization: Secondary | ICD-10-CM | POA: Diagnosis not present

## 2017-07-27 ENCOUNTER — Encounter: Payer: Self-pay | Admitting: Neurology

## 2017-07-27 ENCOUNTER — Ambulatory Visit (INDEPENDENT_AMBULATORY_CARE_PROVIDER_SITE_OTHER): Payer: Medicare Other | Admitting: Neurology

## 2017-07-27 ENCOUNTER — Other Ambulatory Visit: Payer: Medicare Other

## 2017-07-27 VITALS — BP 142/74 | HR 83 | Ht 68.0 in | Wt 174.0 lb

## 2017-07-27 DIAGNOSIS — R413 Other amnesia: Secondary | ICD-10-CM

## 2017-07-27 LAB — VITAMIN B12: Vitamin B-12: 336 pg/mL (ref 200–1100)

## 2017-07-27 LAB — TSH: TSH: 1.35 mIU/L (ref 0.40–4.50)

## 2017-07-27 NOTE — Patient Instructions (Addendum)
1. Bloodwork for TSH, B12  Your provider has requested that you have labwork completed today. Please go to [facility] ([phone]) on the second floor of this building before leaving the office today. You do not need to check in. If you are not called within 15 minutes please check with the front desk.   2. Continue control of blood pressure, cholesterol, blood sugars, as well as physical exercise and brain stimulation exercises for brain health 3. Follow-up in 6 months, call for any changes  RECOMMENDATIONS FOR ALL PATIENTS WITH MEMORY CHANGES: 1. Continue to exercise (Recommend 30 minutes of walking everyday, or 3 hours every week) 2. Increase social interactions - continue going to [place] and enjoy social gatherings with friends and family 3. Eat healthy, avoid fried foods and eat more fruits and vegetables 4. Maintain adequate blood pressure, blood sugar, and blood cholesterol level. Reducing the risk of stroke and cardiovascular disease also helps promoting better memory. 5. Avoid stressful situations. Live a simple life and avoid aggravations. Organize your time and prepare for the next day in anticipation. 6. Sleep well, avoid any interruptions of sleep and avoid any distractions in the bedroom that may interfere with adequate sleep quality 7. Avoid sugar, avoid sweets as there is a strong link between excessive sugar intake, diabetes, and cognitive impairment We discussed the Mediterranean diet, which has been shown to help patients reduce the risk of progressive memory disorders and reduces cardiovascular risk. This includes eating fish, eat fruits and green leafy vegetables, nuts like almonds and hazelnuts, walnuts, and also use olive oil. Avoid fast foods and fried foods as much as possible. Avoid sweets and sugar as sugar use has been linked to worsening of memory function.

## 2017-07-27 NOTE — Progress Notes (Signed)
NEUROLOGY CONSULTATION NOTE  EDELINE Leonard MRN: [id number] DOB: 07/21/1946  Referring provider: Dr. Redmond School Primary care provider: Dr. Redmond School  Reason for consult:  Evaluate for dementia  Dear Dr Gerarda Fraction:  Thank you for your kind referral of [name] for consultation of the above symptoms. Although her history is well known to you, please allow me to reiterate it for the purpose of our medical record. She is alone in the office today. Records and images were personally reviewed where available.  HISTORY OF PRESENT ILLNESS: This is a very pleasant 71 year old right-handed woman with a history of hypertension, hyperlipidemia, diet-controlled diabetes, presenting for evaluation of dementia. She is unsure why she was referred to our office but "was just following my doctor's instructions." She is alone in the office today, but it appears her husband had called Dr. Nolon Rod office asking for her to be checked for Alzheimer's, but not to tell the patient he had called. Otherwise there is no other information about memory concerns or issues. She denies any memory issues. She reports "sometimes I'll be weird," and states it is due to age. 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 She denies getting lost driving. She is in charge of bills and does everything online without any difficulties, no missed bill payments. She makes sure everything is up to date. She reports doing good with her medications, putting them in a pillbox every 2 weeks. She states she is pretty organized, denies misplacing things or leaving the stove on. She has joined a [employer] and goes to Sunday school and joined their choir, with no difficulties learning new lessons/songs. She states sometimes she and her husband joke that she forgets what he had told her. She has 2 children who are estranged from her  which breaks her heart.  She denies any headaches, diplopia, dysarthria/dysphagia, neck/back pain, focal numbness/tingling/weakness, bowel/bladder dysfunction, anosmia, or tremors. She has left hip pain, and cramps in her left calf and right leg. She has a little vertigo when she bends down, having excruciating pain in her left ear that she needs to lift her head up slowly. She tripped last July with no significant injuries. Sleep is good. She reports her mother had a little dementia at age 38, she passed away a few months ago. She denies any history of significant head injuries or alcohol use.    PAST MEDICAL HISTORY: Past Medical History:  Diagnosis Date  . Anginal pain ([place])   . Atypical chest pain   . Depression   . Diabetes mellitus without complication ([place])   . Hyperlipidemia   . Hypertension   . Kidney tumor   . Obesity     PAST SURGICAL HISTORY: Past Surgical History:  Procedure Laterality Date  . ABDOMINAL HYSTERECTOMY    . CARDIAC CATHETERIZATION  2009   normal coronary arteries, preserved LVEF 60%  . CHOLECYSTECTOMY  2006   Dr. Hulen Skains.  . COLONOSCOPY  2004   Dr. Collene Mares: hemorrhoids, diverticulosis, some stool present and small lesions could have been missed. repeat colonoscopy in five years.   . COLONOSCOPY  2016   Dr. Collene Mares: unremarkable per patient  . EMBOLIZATION    . HERNIA REPAIR    . TONSILLECTOMY      MEDICATIONS: Current Outpatient Prescriptions on File Prior to Visit  Medication Sig Dispense Refill  .  acyclovir (ZOVIRAX) 400 MG tablet Take 400 mg by mouth 2 (two) times daily.     Marland Kitchen amLODipine (NORVASC) 5 MG tablet Take 5 mg by mouth daily.    Marland Kitchen aspirin 81 MG tablet Take 81 mg by mouth daily.    Marland Kitchen atorvastatin (LIPITOR) 20 MG tablet Take 20 mg by mouth daily.    Marland Kitchen conjugated estrogens (PREMARIN) vaginal cream Place 9.83 Applicatorfuls vaginally at bedtime. X 2 weeks then 3x weekly 42.5 g 3  . lisinopril (PRINIVIL,ZESTRIL) 40 MG tablet Take 40 mg by mouth  daily.    . phenazopyridine (PYRIDIUM) 200 MG tablet Take 1 tablet (200 mg total) by mouth 3 (three) times daily as needed for pain. 10 tablet 2  . phentermine 37.5 MG capsule Take 37.5 mg by mouth every morning.    . traZODone (DESYREL) 150 MG tablet Take 150 mg by mouth at bedtime.     No current facility-administered medications on file prior to visit.     ALLERGIES: No Known Allergies  FAMILY HISTORY: Family History  Problem Relation Age of Onset  . Diabetes Mother   . Hypertension Mother   . Colon cancer Brother        deceased age 1  . Stroke Father   . Cancer Sister        breast  . Breast cancer Sister   . Other Son        chron's   . Breast cancer Maternal Grandmother     SOCIAL HISTORY: Social History   Social History  . Marital status: Married    Spouse name: N/A  . Number of children: N/A  . Years of education: N/A   Occupational History  . Not on file.   Social History Main Topics  . Smoking status: Never Smoker  . Smokeless tobacco: Never Used  . Alcohol use No  . Drug use: No  . Sexual activity: Yes    Birth control/ protection: Post-menopausal, Surgical   Other Topics Concern  . Not on file   Social History Narrative  . No narrative on file    REVIEW OF SYSTEMS: Constitutional: No fevers, chills, or sweats, no generalized fatigue, change in appetite Eyes: No visual changes, double vision, eye pain Ear, nose and throat: No hearing loss, ear pain, nasal congestion, sore throat Cardiovascular: No chest pain, palpitations Respiratory:  No shortness of breath at rest or with exertion, wheezes GastrointestinaI: No nausea, vomiting, diarrhea, abdominal pain, fecal incontinence Genitourinary:  No dysuria, urinary retention or frequency Musculoskeletal:  No neck pain, back pain Integumentary: No rash, pruritus, skin lesions Neurological: as above Psychiatric: No depression, insomnia, anxiety Endocrine: No palpitations, fatigue, diaphoresis,  mood swings, change in appetite, change in weight, increased thirst Hematologic/Lymphatic:  No anemia, purpura, petechiae. Allergic/Immunologic: no itchy/runny eyes, nasal congestion, recent allergic reactions, rashes  PHYSICAL EXAM: Vitals:   07/27/17 0844  BP: (!) 142/74  Pulse: 83  SpO2: 98%   General: No acute distress Head:  Normocephalic/atraumatic Eyes: Fundoscopic exam shows bilateral sharp discs, no vessel changes, exudates, or hemorrhages Neck: supple, no paraspinal tenderness, full range of motion Back: No paraspinal tenderness Heart: regular rate and rhythm Lungs: Clear to auscultation bilaterally. Vascular: No carotid bruits. Skin/Extremities: No rash, no edema Neurological Exam: Mental status: alert and oriented to person, place, and time, no dysarthria or aphasia, Fund of knowledge is appropriate.  Recent and remote memory are intact.  Attention and concentration are normal.    Able to name objects and repeat  phrases.  Montreal Cognitive Assessment  07/27/2017  Visuospatial/ Executive (0/5) 5  Naming (0/3) 3  Attention: Read list of digits (0/2) 2  Attention: Read list of letters (0/1) 1  Attention: Serial 7 subtraction starting at 100 (0/3) 3  Language: Repeat phrase (0/2) 2  Language : Fluency (0/1) 1  Abstraction (0/2) 2  Delayed Recall (0/5) 4  Orientation (0/6) 6  Total 29   Cranial nerves: CN I: not tested CN II: pupils equal, round and reactive to light, visual fields intact, fundi unremarkable. CN III, IV, VI:  full range of motion, no nystagmus, no ptosis CN V: facial sensation intact CN VII: upper and lower face symmetric CN VIII: hearing intact to finger rub CN IX, X: gag intact, uvula midline CN XI: sternocleidomastoid and trapezius muscles intact CN XII: tongue midline Bulk & Tone: normal, no fasciculations. Motor: 5/5 throughout with no pronator drift. Sensation: intact to light touch, cold, pin, vibration and joint position sense.  No  extinction to double simultaneous stimulation.  Romberg test negative Deep Tendon Reflexes: +2 throughout, no ankle clonus Plantar responses: downgoing bilaterally Cerebellar: no incoordination on finger to nose, heel to shin. No dysdiadochokinesia Gait: slightly slow and cautious due to left hip pain but able to tandem walk adequately Tremor: none  IMPRESSION: This is a very pleasant 71 year old right-handed woman with a history of hypertension, hyperlipidemia, diet-controlled diabetes, presenting for concern for dementia. She is alone in the office today and denies any cognitive issues. Her husband had called her PCP office asking for evaluation of dementia, but did not provide any further information regarding concerns. Her neurological exam is non-focal, MOCA score normal 29/30. We discussed different causes of memory issues, including age-related memory changes, TSH or B12 abnormalities, as well as mood issues. No clear indication for imaging for now. Check TSH and B12. No indication to start cholinesterase inhibitors. We discussed the importance of control of vascular risk factors, physical exercise, and brain stimulation exercises for brain health. She will follow-up in 6 months and knows to call for any changes.  Thank you for allowing me to participate in the care of this patient. Please do not hesitate to call for any questions or concerns.   Ellouise Newer, M.D.  CC: Dr. Gerarda Fraction

## 2017-07-28 ENCOUNTER — Telehealth: Payer: Self-pay

## 2017-07-28 NOTE — Telephone Encounter (Signed)
-----   Message from Cameron Sprang, MD sent at 07/28/2017  8:43 AM EDT ----- Pls let him know thyroid and B12 levels are normal. Thanks

## 2017-07-28 NOTE — Telephone Encounter (Signed)
Called pt.  Line busy.  Unable to leave message.

## 2017-08-02 ENCOUNTER — Telehealth: Payer: Self-pay

## 2017-08-02 NOTE — Telephone Encounter (Signed)
-----   Message from Cameron Sprang, MD sent at 07/28/2017  8:43 AM EDT ----- Pls let him know thyroid and B12 levels are normal. Thanks

## 2017-08-02 NOTE — Telephone Encounter (Signed)
Message below relayed via MyChart message 

## 2017-10-12 DIAGNOSIS — N390 Urinary tract infection, site not specified: Secondary | ICD-10-CM | POA: Diagnosis not present

## 2017-10-12 DIAGNOSIS — I10 Essential (primary) hypertension: Secondary | ICD-10-CM | POA: Diagnosis not present

## 2017-10-12 DIAGNOSIS — G894 Chronic pain syndrome: Secondary | ICD-10-CM | POA: Diagnosis not present

## 2017-10-12 DIAGNOSIS — E782 Mixed hyperlipidemia: Secondary | ICD-10-CM | POA: Diagnosis not present

## 2018-01-05 DIAGNOSIS — M1991 Primary osteoarthritis, unspecified site: Secondary | ICD-10-CM | POA: Diagnosis not present

## 2018-01-05 DIAGNOSIS — N390 Urinary tract infection, site not specified: Secondary | ICD-10-CM | POA: Diagnosis not present

## 2018-01-05 DIAGNOSIS — Z1389 Encounter for screening for other disorder: Secondary | ICD-10-CM | POA: Diagnosis not present

## 2018-01-05 DIAGNOSIS — G894 Chronic pain syndrome: Secondary | ICD-10-CM | POA: Diagnosis not present

## 2018-01-15 ENCOUNTER — Ambulatory Visit: Payer: Medicare Other | Admitting: Neurology

## 2018-01-17 ENCOUNTER — Other Ambulatory Visit: Payer: Self-pay | Admitting: Internal Medicine

## 2018-01-17 DIAGNOSIS — Z1231 Encounter for screening mammogram for malignant neoplasm of breast: Secondary | ICD-10-CM

## 2018-02-09 ENCOUNTER — Ambulatory Visit: Payer: Medicare Other

## 2018-02-22 ENCOUNTER — Ambulatory Visit
Admission: RE | Admit: 2018-02-22 | Discharge: 2018-02-22 | Disposition: A | Discharge status: Home / Self Care | Payer: Medicare Other | Source: Ambulatory Visit | Attending: Internal Medicine | Admitting: Internal Medicine

## 2018-02-22 DIAGNOSIS — Z1231 Encounter for screening mammogram for malignant neoplasm of breast: Secondary | ICD-10-CM | POA: Diagnosis not present

## 2018-02-27 DIAGNOSIS — R0600 Dyspnea, unspecified: Secondary | ICD-10-CM | POA: Diagnosis not present

## 2018-02-27 DIAGNOSIS — I10 Essential (primary) hypertension: Secondary | ICD-10-CM | POA: Diagnosis not present

## 2018-02-27 DIAGNOSIS — G894 Chronic pain syndrome: Secondary | ICD-10-CM | POA: Diagnosis not present

## 2018-02-27 DIAGNOSIS — R55 Syncope and collapse: Secondary | ICD-10-CM | POA: Diagnosis not present

## 2018-03-02 ENCOUNTER — Ambulatory Visit: Payer: Medicare Other | Admitting: Cardiovascular Disease

## 2018-03-02 ENCOUNTER — Encounter: Payer: Self-pay | Admitting: Cardiovascular Disease

## 2018-03-02 DIAGNOSIS — R0789 Other chest pain: Secondary | ICD-10-CM

## 2018-03-02 DIAGNOSIS — E7800 Pure hypercholesterolemia, unspecified: Secondary | ICD-10-CM | POA: Diagnosis not present

## 2018-03-02 DIAGNOSIS — I10 Essential (primary) hypertension: Secondary | ICD-10-CM | POA: Diagnosis not present

## 2018-03-02 NOTE — Assessment & Plan Note (Signed)
History of atypical chest pain with several normal cardiac catheterizations remotely by Dr. Dennison Bulla and a negative Myoview 2 years ago. She really denies chest pain now. Apparently she was working in the yard recently and "overdid it" and had some shortness of breath which I'm not concerned about.

## 2018-03-02 NOTE — Assessment & Plan Note (Signed)
History of hyperlipidemia on statin therapy. 

## 2018-03-02 NOTE — Assessment & Plan Note (Signed)
History of essential hypertension blood pressure measurements at 120/74. She is on lisinopril and amlodipine. Continue current meds at current dosing

## 2018-03-02 NOTE — Progress Notes (Signed)
03/02/2018 [name]   1946/10/14  [id number]  Primary Physician Redmond School, MD Primary Cardiologist: Lorretta Harp MD Lupe Carney, [place]  HPI:  [name] is a 72 y.o.  mildly overweight married Caucasian female mother of 2, grandmother and 3 grandchildren who is referred by Dr. Gerarda Fraction for cardiovascular evaluation because of chest pain. I last saw her in the office 03/02/16. She is retired from working at [employer] in Therapist, art for 23 years. Risk factors include treated hypertension and hyperlipidemia. She has never had a heart attack or stroke. She has had 2 cardiac catheterizations remotely by Dr. Dennison Bulla that were essentially normal. She also has a history of panic attacks and anxiety. She had a negative Myoview stress test 03/16/16. Since I saw HER-2 years ago she's remained remarkably stable. She was working in the yard recently "overdid it" complaining of some shortness of breath.      Current Meds  Medication Sig  . acyclovir (ZOVIRAX) 400 MG tablet Take 400 mg by mouth 2 (two) times daily.   Marland Kitchen amLODipine (NORVASC) 5 MG tablet Take 5 mg by mouth daily.  Marland Kitchen aspirin 81 MG tablet Take 81 mg by mouth daily.  Marland Kitchen atorvastatin (LIPITOR) 20 MG tablet Take 20 mg by mouth daily.  Marland Kitchen conjugated estrogens (PREMARIN) vaginal cream Place 8.59 Applicatorfuls vaginally at bedtime. X 2 weeks then 3x weekly  . lisinopril (PRINIVIL,ZESTRIL) 40 MG tablet Take 40 mg by mouth daily.  . phenazopyridine (PYRIDIUM) 200 MG tablet Take 1 tablet (200 mg total) by mouth 3 (three) times daily as needed for pain.  . phentermine 37.5 MG capsule Take 37.5 mg by mouth every morning.  . traZODone (DESYREL) 150 MG tablet Take 150 mg by mouth at bedtime.     No Known Allergies  Social History   Socioeconomic History  . Marital status: Married    Spouse name: Not on file  . Number of children: Not on file  . Years of education: Not on file  . Highest education level: Not on  file  Occupational History  . Not on file  Social Needs  . Financial resource strain: Not on file  . Food insecurity:    Worry: Not on file    Inability: Not on file  . Transportation needs:    Medical: Not on file    Non-medical: Not on file  Tobacco Use  . Smoking status: Never Smoker  . Smokeless tobacco: Never Used  Substance and Sexual Activity  . Alcohol use: No  . Drug use: No  . Sexual activity: Yes    Birth control/protection: Post-menopausal, Surgical  Lifestyle  . Physical activity:    Days per week: Not on file    Minutes per session: Not on file  . Stress: Not on file  Relationships  . Social connections:    Talks on phone: Not on file    Gets together: Not on file    Attends religious service: Not on file    Active member of club or organization: Not on file    Attends meetings of clubs or organizations: Not on file    Relationship status: Not on file  . Intimate partner violence:    Fear of current or ex partner: Not on file    Emotionally abused: Not on file    Physically abused: Not on file    Forced sexual activity: Not on file  Other Topics Concern  . 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      Review of Systems: General: negative for chills, fever, night sweats or weight changes.  Cardiovascular: negative for chest pain, dyspnea on exertion, edema, orthopnea, palpitations, paroxysmal nocturnal dyspnea or shortness of breath Dermatological: negative for rash Respiratory: negative for cough or wheezing Urologic: negative for hematuria Abdominal: negative for nausea, vomiting, diarrhea, bright red blood per rectum, melena, or hematemesis Neurologic: negative for visual changes, syncope, or dizziness All other systems reviewed and are otherwise negative except as noted  above.    Blood pressure 128/74, pulse 67, height 5' 8"  (1.727 m), weight 191 lb (86.6 kg).  General appearance: alert and no distress Neck: no adenopathy, no carotid bruit, no JVD, supple, symmetrical, trachea midline and thyroid not enlarged, symmetric, no tenderness/mass/nodules Lungs: clear to auscultation bilaterally Heart: regular rate and rhythm, S1, S2 normal, no murmur, click, rub or gallop Extremities: extremities normal, atraumatic, no cyanosis or edema Pulses: 2+ and symmetric Skin: Skin color, texture, turgor normal. No rashes or lesions Neurologic: Alert and oriented X 3, normal strength and tone. Normal symmetric reflexes. Normal coordination and gait  EKG not performed today  ASSESSMENT AND PLAN:   Hypertension History of essential hypertension blood pressure measurements at 120/74. She is on lisinopril and amlodipine. Continue current meds at current dosing  Hyperlipidemia History of hyperlipidemia on statin therapy  Atypical chest pain History of atypical chest pain with several normal cardiac catheterizations remotely by Dr. Dennison Bulla and a negative Myoview 2 years ago. She really denies chest pain now. Apparently she was working in the yard recently and "overdid it" and had some shortness of breath which I'm not concerned about.      Lorretta Harp MD FACP,FACC,FAHA, [facility] 03/02/2018 11:41 AM

## 2018-03-02 NOTE — Patient Instructions (Signed)

## 2018-04-09 DIAGNOSIS — M25552 Pain in left hip: Secondary | ICD-10-CM | POA: Diagnosis not present

## 2018-04-09 DIAGNOSIS — M7062 Trochanteric bursitis, left hip: Secondary | ICD-10-CM | POA: Diagnosis not present

## 2018-04-16 DIAGNOSIS — Z1389 Encounter for screening for other disorder: Secondary | ICD-10-CM | POA: Diagnosis not present

## 2018-04-16 DIAGNOSIS — E7849 Other hyperlipidemia: Secondary | ICD-10-CM | POA: Diagnosis not present

## 2018-04-16 DIAGNOSIS — M1991 Primary osteoarthritis, unspecified site: Secondary | ICD-10-CM | POA: Diagnosis not present

## 2018-04-16 DIAGNOSIS — G894 Chronic pain syndrome: Secondary | ICD-10-CM | POA: Diagnosis not present

## 2018-04-16 DIAGNOSIS — I10 Essential (primary) hypertension: Secondary | ICD-10-CM | POA: Diagnosis not present

## 2018-04-25 ENCOUNTER — Encounter (HOSPITAL_COMMUNITY): Payer: Self-pay | Admitting: Emergency Medicine

## 2018-04-25 ENCOUNTER — Emergency Department (HOSPITAL_COMMUNITY)
Admission: EM | Admit: 2018-04-25 | Discharge: 2018-04-25 | Disposition: A | Discharge status: Home / Self Care | Payer: Medicare Other | Attending: Emergency Medicine | Admitting: Emergency Medicine

## 2018-04-25 ENCOUNTER — Other Ambulatory Visit: Payer: Self-pay

## 2018-04-25 DIAGNOSIS — T63441A Toxic effect of venom of bees, accidental (unintentional), initial encounter: Secondary | ICD-10-CM | POA: Diagnosis not present

## 2018-04-25 DIAGNOSIS — I10 Essential (primary) hypertension: Secondary | ICD-10-CM | POA: Diagnosis not present

## 2018-04-25 DIAGNOSIS — T7840XA Allergy, unspecified, initial encounter: Secondary | ICD-10-CM | POA: Insufficient documentation

## 2018-04-25 DIAGNOSIS — E119 Type 2 diabetes mellitus without complications: Secondary | ICD-10-CM | POA: Insufficient documentation

## 2018-04-25 DIAGNOSIS — L539 Erythematous condition, unspecified: Secondary | ICD-10-CM | POA: Diagnosis present

## 2018-04-25 DIAGNOSIS — R42 Dizziness and giddiness: Secondary | ICD-10-CM | POA: Diagnosis not present

## 2018-04-25 LAB — CBC WITH DIFFERENTIAL/PLATELET
Basophils Absolute: 0 10*3/uL (ref 0.0–0.1)
Basophils Relative: 0 %
Eosinophils Absolute: 0.2 10*3/uL (ref 0.0–0.7)
Eosinophils Relative: 3 %
HCT: 38.4 % (ref 36.0–46.0)
Hemoglobin: 12.8 g/dL (ref 12.0–15.0)
Lymphocytes Relative: 24 %
Lymphs Abs: 1.5 10*3/uL (ref 0.7–4.0)
MCH: 30.8 pg (ref 26.0–34.0)
MCHC: 33.3 g/dL (ref 30.0–36.0)
MCV: 92.3 fL (ref 78.0–100.0)
Monocytes Absolute: 0.7 10*3/uL (ref 0.1–1.0)
Monocytes Relative: 11 %
Neutro Abs: 3.9 10*3/uL (ref 1.7–7.7)
Neutrophils Relative %: 62 %
Platelets: 207 10*3/uL (ref 150–400)
RBC: 4.16 MIL/uL (ref 3.87–5.11)
RDW: 13.3 % (ref 11.5–15.5)
WBC: 6.3 10*3/uL (ref 4.0–10.5)

## 2018-04-25 LAB — COMPREHENSIVE METABOLIC PANEL
ALT: 11 U/L (ref 0–44)
AST: 16 U/L (ref 15–41)
Albumin: 3.5 g/dL (ref 3.5–5.0)
Alkaline Phosphatase: 63 U/L (ref 38–126)
Anion gap: 6 (ref 5–15)
BUN: 15 mg/dL (ref 8–23)
CO2: 27 mmol/L (ref 22–32)
Calcium: 8.8 mg/dL — ABNORMAL LOW (ref 8.9–10.3)
Chloride: 109 mmol/L (ref 98–111)
Creatinine, Ser: 0.88 mg/dL (ref 0.44–1.00)
GFR calc Af Amer: >60 mL/min (ref >60)
GFR calc non Af Amer: >60 mL/min (ref >60)
Glucose, Bld: 122 mg/dL — ABNORMAL HIGH (ref 70–99)
Potassium: 3.4 mmol/L — ABNORMAL LOW (ref 3.5–5.1)
Sodium: 142 mmol/L (ref 135–145)
Total Bilirubin: 0.5 mg/dL (ref 0.3–1.2)
Total Protein: 6.2 g/dL — ABNORMAL LOW (ref 6.5–8.1)

## 2018-04-25 LAB — I-STAT TROPONIN, ED: Troponin i, poc: 0 ng/mL (ref 0.00–0.08)

## 2018-04-25 LAB — URINALYSIS, ROUTINE W REFLEX MICROSCOPIC
Bilirubin Urine: NEGATIVE
Glucose, UA: NEGATIVE mg/dL
Hgb urine dipstick: NEGATIVE
Ketones, ur: NEGATIVE mg/dL
Leukocytes, UA: NEGATIVE
Nitrite: NEGATIVE
Protein, ur: NEGATIVE mg/dL
Specific Gravity, Urine: 1.011 (ref 1.005–1.030)
pH: 7 (ref 5.0–8.0)

## 2018-04-25 MED ORDER — PREDNISONE 20 MG PO TABS: 40.0000 mg | ORAL_TABLET | Freq: Every day | ORAL | 0 refills | Status: AC

## 2018-04-25 MED ORDER — FAMOTIDINE IN NACL 20-0.9 MG/50ML-% IV SOLN
20.0000 mg | Freq: Once | INTRAVENOUS | Status: AC
  Administered 2018-04-25: 20 mg via INTRAVENOUS
  Filled 2018-04-25: qty 50

## 2018-04-25 MED ORDER — EPINEPHRINE 0.3 MG/0.3ML IJ SOAJ: 0.3000 mg | Freq: Once | INTRAMUSCULAR | 0 refills | Status: AC

## 2018-04-25 MED ORDER — DIPHENHYDRAMINE HCL 50 MG/ML IJ SOLN
25.0000 mg | Freq: Once | INTRAMUSCULAR | Status: AC
  Administered 2018-04-25: 25 mg via INTRAVENOUS
  Filled 2018-04-25: qty 1

## 2018-04-25 MED ORDER — SODIUM CHLORIDE 0.9 % IV BOLUS
1000.0000 mL | Freq: Once | INTRAVENOUS | Status: AC
  Administered 2018-04-25: 1000 mL via INTRAVENOUS

## 2018-04-25 MED ORDER — ONDANSETRON HCL 4 MG/2ML IJ SOLN
4.0000 mg | Freq: Once | INTRAMUSCULAR | Status: AC
  Administered 2018-04-25: 4 mg via INTRAVENOUS
  Filled 2018-04-25: qty 2

## 2018-04-25 MED ORDER — METHYLPREDNISOLONE SODIUM SUCC 125 MG IJ SOLR
80.0000 mg | Freq: Once | INTRAMUSCULAR | Status: AC
  Administered 2018-04-25: 80 mg via INTRAVENOUS
  Filled 2018-04-25: qty 2

## 2018-04-25 NOTE — ED Provider Notes (Signed)
Emergency Department Provider Note   I have reviewed the triage vital signs and the nursing notes.   HISTORY  Chief Complaint Insect Bite   HPI [name] is a 72 y.o. female with PMH of HLD, HTN, and prior DM now improved with weight loss presents to the emergency department for evaluation of pain and redness at the site of several wasp stings.  The stings occurred 2 days ago.  She states since that time she has had pain and redness at the sites but is developed some associated lightheadedness, nausea, decreased appetite.  She has taken Benadryl which has improved her symptoms with prior stings but not this time.  She denies any chest pain or dyspnea specifically but states she feels very weak and lightheaded upon standing.  She has some mild throat discomfort but no difficulty swallowing or speaking.  No additional stings since 2 days ago.  No prior anaphylaxis.  No new medications.  Fevers or chills.  Denies any abdominal pain or dysuria.   Past Medical History:  Diagnosis Date  . Anginal pain ([place])   . Atypical chest pain   . Depression   . Hyperlipidemia   . Hypertension   . Kidney tumor   . Obesity     Patient Active Problem List   Diagnosis Date Noted  . Hyperlipidemia 03/02/2016  . Atypical chest pain 03/02/2016  . Ileitis   . Abdominal pain 11/11/2015  . Hypertension 11/11/2015  . Diabetes ([place]) 11/11/2015    Past Surgical History:  Procedure Laterality Date  . ABDOMINAL HYSTERECTOMY    . CARDIAC CATHETERIZATION  2009   normal coronary arteries, preserved LVEF 60%  . CHOLECYSTECTOMY  2006   Dr. Hulen Skains.  . COLONOSCOPY  2004   Dr. Collene Mares: hemorrhoids, diverticulosis, some stool present and small lesions could have been missed. repeat colonoscopy in five years.   . COLONOSCOPY  2016   Dr. Collene Mares: unremarkable per patient  . EMBOLIZATION    . HERNIA REPAIR    . TONSILLECTOMY      Allergies Bee venom  Family History  Problem Relation Age of Onset  .  Diabetes Mother   . Hypertension Mother   . Colon cancer Brother        deceased age 50  . Stroke Father   . Cancer Sister        breast  . Breast cancer Sister   . Other Son        chron's   . Breast cancer Maternal Grandmother     Social History Social History   Tobacco Use  . Smoking status: Never Smoker  . Smokeless tobacco: Never Used  Substance Use Topics  . Alcohol use: No  . Drug use: No    Review of Systems  Constitutional: No fever/chills. Positive lightheadedness.  Eyes: No visual changes. ENT: Positive sore throat. Cardiovascular: Denies chest pain. Respiratory: Denies shortness of breath. Gastrointestinal: No abdominal pain. Positive nausea, no vomiting.  No diarrhea.  No constipation. Genitourinary: Negative for dysuria. Musculoskeletal: Negative for back pain. Skin: Positive redness around sting areas.  Neurological: Negative for headaches, focal weakness or numbness.  10-point ROS otherwise negative.  ____________________________________________   PHYSICAL EXAM:  VITAL SIGNS: ED Triage Vitals [04/25/18 1019]  Enc Vitals Group     BP (!) 172/72     Pulse Rate 77     Resp 18     Temp 97.6 F (36.4 C)     Temp Source Oral  SpO2 100 %     Weight 180 lb (81.6 kg)     Height 5\' 8"  (1.727 m)     Pain Score 6   Constitutional: Alert and oriented. Well appearing and in no acute distress. Eyes: Conjunctivae are normal. Head: Atraumatic. Nose: No congestion/rhinnorhea. Mouth/Throat: Mucous membranes are slightly dry.  Neck: No stridor. Cardiovascular: Normal rate, regular rhythm. Good peripheral circulation. Grossly normal heart sounds.   Respiratory: Normal respiratory effort.  No retractions. Lungs CTAB. Gastrointestinal: Soft and nontender. No distention.  Musculoskeletal: No lower extremity tenderness nor edema. No gross deformities of extremities. Neurologic:  Normal speech and language. No gross focal neurologic deficits are  appreciated.  Skin:  Skin is warm, dry and intact. Erythema over the right medial elbow and upper arm. Mild erythema over the suprapubic area. No cellulitis, induration, or abscess appreciated.  ____________________________________________   LABS (all labs ordered are listed, but only abnormal results are displayed)  Labs Reviewed  COMPREHENSIVE METABOLIC PANEL - Abnormal; Notable for the following components:      Result Value   Potassium 3.4 (*)    Glucose, Bld 122 (*)    Calcium 8.8 (*)    Total Protein 6.2 (*)    All other components within normal limits  CBC WITH DIFFERENTIAL/PLATELET  URINALYSIS, ROUTINE W REFLEX MICROSCOPIC  I-STAT TROPONIN, ED   ____________________________________________  EKG   EKG Interpretation  Date/Time:  Wednesday April 25 2018 11:03:16 EDT Ventricular Rate:  68 PR Interval:    QRS Duration: 99 QT Interval:  476 QTC Calculation: 507 R Axis:   59 Text Interpretation:  Sinus rhythm Prolonged QT interval No STEMI.  Similar to prior.  Confirmed by Nanda Quinton [phone]) on 04/25/2018 11:12:59 AM      ____________________________________________   PROCEDURES  Procedure(s) performed:   Procedures  None ____________________________________________   INITIAL IMPRESSION / ASSESSMENT AND PLAN / ED COURSE  Pertinent labs & imaging results that were available during my care of the patient were reviewed by me and considered in my medical decision making (see chart for details).  Patient presents to the emergency department for evaluation of nonspecific symptoms after being stung by several wasps 2 days ago.  She has areas of erythema over the right arm and suprapubic area which do not appear to be secondarily infected.  I see no sign of abscess on my exam.  She is complaining of lightheadedness, loss of appetite, nausea.  She does complain of some sore throat.  The oropharynx is widely patent with no concern for acute allergic reaction.  No reason  to suspect atypical anaphylaxis symptoms this far from initial staying.  Patient with normal vital signs.  Doubt atypical ACS but given the patient's age plan for labs including troponin, EKG, and reassess after IV fluids, Benadryl, steroid, Pepcid, and Zofran.  Patient observed in the emergency department briefly with no worsening symptoms.  Urinalysis is negative.  Labs reviewed with no acute findings.  No indication for imaging at this time.  Plan for continued treatment of moderate allergic reaction.  Discussed the use of EpiPen with the patient in detail and provided a prescription at discharge.  Advised she needs to keep this near her especially when working outside. Advised close PCP follow up this week.   At this time, I do not feel there is any life-threatening condition present. I have reviewed and discussed all results (EKG, imaging, lab, urine as appropriate), exam findings with patient. I have reviewed nursing notes and appropriate  previous records.  I feel the patient is safe to be discharged home without further emergent workup. Discussed usual and customary return precautions. Patient and family (if present) verbalize understanding and are comfortable with this plan.  Patient will follow-up with their primary care provider. If they do not have a primary care provider, information for follow-up has been provided to them. All questions have been answered.  ____________________________________________  FINAL CLINICAL IMPRESSION(S) / ED DIAGNOSES  Final diagnoses:  Bee sting, accidental or unintentional, initial encounter  Allergic reaction, initial encounter     MEDICATIONS GIVEN DURING THIS VISIT:  Medications  methylPREDNISolone sodium succinate (SOLU-MEDROL) 125 mg/2 mL injection 80 mg (80 mg Intravenous Given 04/25/18 1123)  diphenhydrAMINE (BENADRYL) injection 25 mg (25 mg Intravenous Given 04/25/18 1131)  famotidine (PEPCID) IVPB 20 mg premix (0 mg Intravenous Stopped 04/25/18  1153)  ondansetron (ZOFRAN) injection 4 mg (4 mg Intravenous Given 04/25/18 1124)  sodium chloride 0.9 % bolus 1,000 mL (0 mLs Intravenous Stopped 04/25/18 1313)     NEW OUTPATIENT MEDICATIONS STARTED DURING THIS VISIT:  Discharge Medication List as of 04/25/2018  1:19 PM    START taking these medications   Details  EPINEPHrine (EPIPEN 2-PAK) 0.3 mg/0.3 mL IJ SOAJ injection Inject 0.3 mLs (0.3 mg total) into the muscle once for 1 dose., Starting Wed 04/25/2018, Print    predniSONE (DELTASONE) 20 MG tablet Take 2 tablets (40 mg total) by mouth daily for 3 days., Starting Thu 04/26/2018, Until Sun 04/29/2018, Print        Note:  This document was prepared using Dragon voice recognition software and may include unintentional dictation errors.  Nanda Quinton, MD Emergency Medicine    Garry Bochicchio, [facility], MD 04/25/18 1452

## 2018-04-25 NOTE — Discharge Instructions (Signed)

## 2018-04-25 NOTE — ED Triage Notes (Signed)
Pt was stung by a wasp in the right arm and lower abdomen on Monday. Pt c/o of dizziness, decreased appetite and nausea since.  Pt state she has been stung by bees before but has never had a reaction

## 2018-05-03 ENCOUNTER — Emergency Department (HOSPITAL_COMMUNITY)
Admission: EM | Admit: 2018-05-03 | Discharge: 2018-05-03 | Disposition: A | Discharge status: Home / Self Care | Payer: Medicare Other | Attending: Emergency Medicine | Admitting: Emergency Medicine

## 2018-05-03 ENCOUNTER — Encounter (HOSPITAL_COMMUNITY): Payer: Self-pay | Admitting: *Deleted

## 2018-05-03 ENCOUNTER — Other Ambulatory Visit: Payer: Self-pay

## 2018-05-03 DIAGNOSIS — T7840XA Allergy, unspecified, initial encounter: Secondary | ICD-10-CM | POA: Diagnosis not present

## 2018-05-03 DIAGNOSIS — T63484A Toxic effect of venom of other arthropod, undetermined, initial encounter: Secondary | ICD-10-CM | POA: Diagnosis not present

## 2018-05-03 DIAGNOSIS — Z79899 Other long term (current) drug therapy: Secondary | ICD-10-CM | POA: Diagnosis not present

## 2018-05-03 DIAGNOSIS — Z7982 Long term (current) use of aspirin: Secondary | ICD-10-CM | POA: Diagnosis not present

## 2018-05-03 DIAGNOSIS — R0789 Other chest pain: Secondary | ICD-10-CM | POA: Diagnosis not present

## 2018-05-03 DIAGNOSIS — I10 Essential (primary) hypertension: Secondary | ICD-10-CM | POA: Diagnosis not present

## 2018-05-03 DIAGNOSIS — R0682 Tachypnea, not elsewhere classified: Secondary | ICD-10-CM | POA: Diagnosis not present

## 2018-05-03 DIAGNOSIS — M7989 Other specified soft tissue disorders: Secondary | ICD-10-CM | POA: Diagnosis not present

## 2018-05-03 MED ORDER — FAMOTIDINE IN NACL 20-0.9 MG/50ML-% IV SOLN
20.0000 mg | Freq: Once | INTRAVENOUS | Status: AC
  Administered 2018-05-03: 20 mg via INTRAVENOUS
  Filled 2018-05-03: qty 50

## 2018-05-03 MED ORDER — SODIUM CHLORIDE 0.9 % IV BOLUS
500.0000 mL | Freq: Once | INTRAVENOUS | Status: AC
  Administered 2018-05-03: 500 mL via INTRAVENOUS

## 2018-05-03 MED ORDER — METHYLPREDNISOLONE SODIUM SUCC 125 MG IJ SOLR
125.0000 mg | Freq: Once | INTRAMUSCULAR | Status: AC
  Administered 2018-05-03: 125 mg via INTRAVENOUS
  Filled 2018-05-03: qty 2

## 2018-05-03 MED ORDER — EPINEPHRINE 0.3 MG/0.3ML IJ SOAJ: 0.3000 mg | Freq: Once | INTRAMUSCULAR | 0 refills | Status: AC

## 2018-05-03 NOTE — ED Provider Notes (Signed)
[facility] EMERGENCY DEPARTMENT Provider Note   CSN: [id number] Arrival date & time: 05/03/18  1921     History   Chief Complaint Chief Complaint  Patient presents with  . Allergic Reaction    HPI [name] is a 72 y.o. female.  Patient was stung on the right foot and has a history of allergies that she gave herself an EpiPen.  She is only has experienced swelling in that foot from this staying  The history is provided by the patient. No language interpreter was used.  Allergic Reaction  Presenting symptoms: no difficulty breathing and no rash   Severity:  Mild Prior allergic episodes:  Insect allergies Context: not animal exposure   Relieved by:  Nothing Worsened by:  Nothing Ineffective treatments:  None tried   Past Medical History:  Diagnosis Date  . Anginal pain ([place])   . Atypical chest pain   . Depression   . Hyperlipidemia   . Hypertension   . Kidney tumor   . Obesity     Patient Active Problem List   Diagnosis Date Noted  . Hyperlipidemia 03/02/2016  . Atypical chest pain 03/02/2016  . Ileitis   . Abdominal pain 11/11/2015  . Hypertension 11/11/2015  . Diabetes ([place]) 11/11/2015    Past Surgical History:  Procedure Laterality Date  . ABDOMINAL HYSTERECTOMY    . CARDIAC CATHETERIZATION  2009   normal coronary arteries, preserved LVEF 60%  . CHOLECYSTECTOMY  2006   Dr. Hulen Skains.  . COLONOSCOPY  2004   Dr. Collene Mares: hemorrhoids, diverticulosis, some stool present and small lesions could have been missed. repeat colonoscopy in five years.   . COLONOSCOPY  2016   Dr. Collene Mares: unremarkable per patient  . EMBOLIZATION    . HERNIA REPAIR    . TONSILLECTOMY       OB History   None      Home Medications    Prior to Admission medications   Medication Sig Start Date End Date Taking? Authorizing Provider  acyclovir (ZOVIRAX) 400 MG tablet Take 400 mg by mouth 2 (two) times daily.    Yes [provider]  amLODipine (NORVASC) 5 MG tablet  Take 5 mg by mouth daily.   Yes [provider]  aspirin 81 MG tablet Take 81 mg by mouth daily.   Yes [provider]  atorvastatin (LIPITOR) 20 MG tablet Take 20 mg by mouth daily.   Yes [provider]  fluticasone (FLONASE) 50 MCG/ACT nasal spray Place 2 sprays into both nostrils as needed.   Yes [provider]  HYDROcodone-acetaminophen (NORCO) 10-325 MG tablet Take 1 tablet by mouth every 4 (four) hours.   Yes [provider]  lisinopril (PRINIVIL,ZESTRIL) 40 MG tablet Take 40 mg by mouth daily.   Yes [provider]  phentermine 37.5 MG capsule Take 37.5 mg by mouth every morning.   Yes [provider]  terbinafine (LAMISIL) 250 MG tablet terbinafine HCl 250 mg tablet   Yes [provider]  traZODone (DESYREL) 150 MG tablet Take 150 mg by mouth at bedtime. 10/30/15  Yes [provider]  EPINEPHrine (EPIPEN 2-PAK) 0.3 mg/0.3 mL IJ SOAJ injection Inject 0.3 mLs (0.3 mg total) into the muscle once for 1 dose. 05/03/18 05/03/18  Milton Ferguson, MD    Family History Family History  Problem Relation Age of Onset  . Diabetes Mother   . Hypertension Mother   . Colon cancer Brother        deceased  age 46  . Stroke Father   . Cancer Sister        breast  . Breast cancer Sister   . Other Son        chron's   . Breast cancer Maternal Grandmother     Social History Social History   Tobacco Use  . Smoking status: Never Smoker  . Smokeless tobacco: Never Used  Substance Use Topics  . Alcohol use: No  . Drug use: No     Allergies   Bee venom   Review of Systems Review of Systems  Constitutional: Negative for appetite change and fatigue.  HENT: Negative for congestion, ear discharge and sinus pressure.   Eyes: Negative for discharge.  Respiratory: Negative for cough.   Cardiovascular: Negative for chest pain.  Gastrointestinal: Negative for abdominal pain and diarrhea.  Genitourinary: Negative for  frequency and hematuria.  Musculoskeletal: Negative for back pain.       Swelling in her right foot  Skin: Negative for rash.  Neurological: Negative for seizures and headaches.  Psychiatric/Behavioral: Negative for hallucinations.     Physical Exam Updated Vital Signs BP (!) 155/87   Pulse 86   Temp 97.7 F (36.5 C) (Oral)   Resp (!) 23   Ht 5\' 8"  (1.727 m)   Wt 81.6 kg (180 lb)   SpO2 99%   BMI 27.37 kg/m   Physical Exam  Constitutional: She is oriented to person, place, and time. She appears well-developed.  HENT:  Head: Normocephalic.  Eyes: Conjunctivae and EOM are normal. No scleral icterus.  Neck: Neck supple. No thyromegaly present.  Cardiovascular: Normal rate and regular rhythm. Exam reveals no gallop and no friction rub.  No murmur heard. Pulmonary/Chest: No stridor. She has no wheezes. She has no rales. She exhibits no tenderness.  Abdominal: She exhibits no distension. There is no tenderness. There is no rebound.  Musculoskeletal: Normal range of motion. She exhibits no edema.  Mild swelling right foot  Lymphadenopathy:    She has no cervical adenopathy.  Neurological: She is oriented to person, place, and time. She exhibits normal muscle tone. Coordination normal.  Skin: No rash noted. No erythema.  Psychiatric: She has a normal mood and affect. Her behavior is normal.     ED Treatments / Results  Labs (all labs ordered are listed, but only abnormal results are displayed) Labs Reviewed - No data to display  EKG None  Radiology No results found.  Procedures Procedures (including critical care time)  Medications Ordered in ED Medications  famotidine (PEPCID) IVPB 20 mg premix (0 mg Intravenous Stopped 05/03/18 2017)  methylPREDNISolone sodium succinate (SOLU-MEDROL) 125 mg/2 mL injection 125 mg (125 mg Intravenous Given 05/03/18 1940)  sodium chloride 0.9 % bolus 500 mL (0 mLs Intravenous Stopped 05/03/18 2057)     Initial Impression / Assessment  and Plan / ED Course  I have reviewed the triage vital signs and the nursing notes.  Pertinent labs & imaging results that were available during my care of the patient were reviewed by me and considered in my medical decision making (see chart for details).     Patient with insect sting.  Patient had mild to moderate local reaction.  She took an EpiPen at home.  Patient will follow-up as needed  Final Clinical Impressions(s) / ED Diagnoses   Final diagnoses:  Allergic reaction, initial encounter    ED Discharge Orders        Ordered    EPINEPHrine (EPIPEN 2-PAK)  0.3 mg/0.3 mL IJ SOAJ injection   Once     05/03/18 2223       Milton Ferguson, MD 05/03/18 2225

## 2018-05-03 NOTE — ED Notes (Addendum)
Pt states she feels very cold. Additional blankets given.

## 2018-05-03 NOTE — ED Notes (Signed)
Pt comfortably resting at this time.  Lights turned down for comfort.

## 2018-05-03 NOTE — ED Notes (Addendum)
Pt states she "feels out of breath" and "cannot catch her breath" Pt respirations 19 at this time with 100% saturation, non-labored symmetrical breathing at this time. Pt also complains of pain when blood pressure is taken, cuff adjusted for comfort.

## 2018-05-03 NOTE — ED Triage Notes (Signed)
Pt arrived to er by [facility] after getting stuck by a bee, has hx of allergic reaction to bees, pt self injected her epi pen and took 75 mg benadryl, upon ems arrival pt denies sob, no wheezing, no hives, rash noted, upon arrival to er pt anxious, states " I just don't feel good" redness noted to inside of right ankle,

## 2018-05-03 NOTE — Discharge Instructions (Addendum)
Take Benadryl for itching or swelling and follow-up with your doctor or return to emergency department if problems

## 2018-05-08 DIAGNOSIS — E748 Other specified disorders of carbohydrate metabolism: Secondary | ICD-10-CM | POA: Diagnosis not present

## 2018-05-08 DIAGNOSIS — R7309 Other abnormal glucose: Secondary | ICD-10-CM | POA: Diagnosis not present

## 2018-05-08 DIAGNOSIS — Z0000 Encounter for general adult medical examination without abnormal findings: Secondary | ICD-10-CM | POA: Diagnosis not present

## 2018-05-08 DIAGNOSIS — Z0001 Encounter for general adult medical examination with abnormal findings: Secondary | ICD-10-CM | POA: Diagnosis not present

## 2018-05-08 DIAGNOSIS — Z1389 Encounter for screening for other disorder: Secondary | ICD-10-CM | POA: Diagnosis not present

## 2018-05-09 ENCOUNTER — Encounter (HOSPITAL_COMMUNITY): Payer: Self-pay | Admitting: Radiology

## 2018-05-09 ENCOUNTER — Emergency Department (HOSPITAL_COMMUNITY): Payer: Medicare Other

## 2018-05-09 ENCOUNTER — Emergency Department (HOSPITAL_COMMUNITY)
Admission: EM | Admit: 2018-05-09 | Discharge: 2018-05-09 | Disposition: A | Discharge status: Home / Self Care | Payer: Medicare Other | Attending: Emergency Medicine | Admitting: Emergency Medicine

## 2018-05-09 ENCOUNTER — Other Ambulatory Visit: Payer: Self-pay

## 2018-05-09 DIAGNOSIS — R0689 Other abnormalities of breathing: Secondary | ICD-10-CM | POA: Diagnosis not present

## 2018-05-09 DIAGNOSIS — E119 Type 2 diabetes mellitus without complications: Secondary | ICD-10-CM | POA: Insufficient documentation

## 2018-05-09 DIAGNOSIS — R0602 Shortness of breath: Secondary | ICD-10-CM

## 2018-05-09 DIAGNOSIS — I10 Essential (primary) hypertension: Secondary | ICD-10-CM | POA: Insufficient documentation

## 2018-05-09 DIAGNOSIS — Z7982 Long term (current) use of aspirin: Secondary | ICD-10-CM | POA: Insufficient documentation

## 2018-05-09 DIAGNOSIS — R0902 Hypoxemia: Secondary | ICD-10-CM | POA: Diagnosis not present

## 2018-05-09 DIAGNOSIS — Z79899 Other long term (current) drug therapy: Secondary | ICD-10-CM | POA: Diagnosis not present

## 2018-05-09 DIAGNOSIS — R079 Chest pain, unspecified: Secondary | ICD-10-CM | POA: Diagnosis not present

## 2018-05-09 DIAGNOSIS — R7989 Other specified abnormal findings of blood chemistry: Secondary | ICD-10-CM | POA: Diagnosis not present

## 2018-05-09 DIAGNOSIS — E876 Hypokalemia: Secondary | ICD-10-CM | POA: Diagnosis not present

## 2018-05-09 LAB — CBC WITH DIFFERENTIAL/PLATELET
Abs Immature Granulocytes: 0.1 10*3/uL (ref 0.0–0.1)
Basophils Absolute: 0.1 10*3/uL (ref 0.0–0.1)
Basophils Relative: 1 %
Eosinophils Absolute: 0.1 10*3/uL (ref 0.0–0.7)
Eosinophils Relative: 1 %
HCT: 39.4 % (ref 36.0–46.0)
Hemoglobin: 12.8 g/dL (ref 12.0–15.0)
Immature Granulocytes: 1 %
Lymphocytes Relative: 26 %
Lymphs Abs: 2.2 10*3/uL (ref 0.7–4.0)
MCH: 30.3 pg (ref 26.0–34.0)
MCHC: 32.5 g/dL (ref 30.0–36.0)
MCV: 93.4 fL (ref 78.0–100.0)
Monocytes Absolute: 0.7 10*3/uL (ref 0.1–1.0)
Monocytes Relative: 8 %
Neutro Abs: 5.3 10*3/uL (ref 1.7–7.7)
Neutrophils Relative %: 63 %
Platelets: 251 10*3/uL (ref 150–400)
RBC: 4.22 MIL/uL (ref 3.87–5.11)
RDW: 13.1 % (ref 11.5–15.5)
WBC: 8.3 10*3/uL (ref 4.0–10.5)

## 2018-05-09 LAB — COMPREHENSIVE METABOLIC PANEL
ALT: 10 U/L (ref 0–44)
AST: 16 U/L (ref 15–41)
Albumin: 3.4 g/dL — ABNORMAL LOW (ref 3.5–5.0)
Alkaline Phosphatase: 68 U/L (ref 38–126)
Anion gap: 8 (ref 5–15)
BUN: 13 mg/dL (ref 8–23)
CO2: 24 mmol/L (ref 22–32)
Calcium: 9.1 mg/dL (ref 8.9–10.3)
Chloride: 109 mmol/L (ref 98–111)
Creatinine, Ser: 1 mg/dL (ref 0.44–1.00)
GFR calc Af Amer: >60 mL/min (ref >60)
GFR calc non Af Amer: 55 mL/min — ABNORMAL LOW (ref >60)
Glucose, Bld: 161 mg/dL — ABNORMAL HIGH (ref 70–99)
Potassium: 2.9 mmol/L — ABNORMAL LOW (ref 3.5–5.1)
Sodium: 141 mmol/L (ref 135–145)
Total Bilirubin: 0.5 mg/dL (ref 0.3–1.2)
Total Protein: 5.8 g/dL — ABNORMAL LOW (ref 6.5–8.1)

## 2018-05-09 LAB — I-STAT TROPONIN, ED
Troponin i, poc: 0 ng/mL (ref 0.00–0.08)
Troponin i, poc: 0 ng/mL (ref 0.00–0.08)

## 2018-05-09 LAB — D-DIMER, QUANTITATIVE: D-Dimer, Quant: 0.87 ug/mL-FEU — ABNORMAL HIGH (ref 0.00–0.50)

## 2018-05-09 LAB — CBG MONITORING, ED: Glucose-Capillary: 215 mg/dL — ABNORMAL HIGH (ref 70–99)

## 2018-05-09 MED ORDER — LORAZEPAM 2 MG/ML IJ SOLN
1.0000 mg | Freq: Once | INTRAMUSCULAR | Status: AC
  Administered 2018-05-09: 1 mg via INTRAVENOUS
  Filled 2018-05-09: qty 1

## 2018-05-09 MED ORDER — SODIUM CHLORIDE 0.9 % IV BOLUS
1000.0000 mL | Freq: Once | INTRAVENOUS | Status: AC
  Administered 2018-05-09: 1000 mL via INTRAVENOUS

## 2018-05-09 MED ORDER — POTASSIUM CHLORIDE CRYS ER 20 MEQ PO TBCR
40.0000 meq | EXTENDED_RELEASE_TABLET | Freq: Once | ORAL | Status: AC
  Administered 2018-05-09: 40 meq via ORAL
  Filled 2018-05-09: qty 2

## 2018-05-09 MED ORDER — IOPAMIDOL (ISOVUE-370) INJECTION 76%
INTRAVENOUS | Status: AC
  Filled 2018-05-09: qty 100

## 2018-05-09 MED ORDER — IOPAMIDOL (ISOVUE-370) INJECTION 76%
100.0000 mL | Freq: Once | INTRAVENOUS | Status: AC | PRN
  Administered 2018-05-09: 100 mL via INTRAVENOUS

## 2018-05-09 NOTE — ED Notes (Signed)
Pt reports a decrease in CP and SOB , pt states " I  Feel a lot more calm at this time "

## 2018-05-09 NOTE — ED Provider Notes (Signed)
[facility] EMERGENCY DEPARTMENT Provider Note   CSN: [id number] Arrival date & time: 05/09/18  1418     History   Chief Complaint Chief Complaint  Patient presents with  . Shortness of Breath    HPI [name] is a 72 y.o. female history of hypertension, previous atypical chest pain with extensive work-up, hyperlipidemia here presenting with shortness of breath.  Patient states that she had a insect bite several weeks ago and had 2 ED visits for allergic reaction and was given EpiPen.  Patient was driving today and had sudden onset of shortness of breath and palpitations.  Patient states that she had trouble catching her breath.  Patient denies any recent travel or leg swelling or history of blood clots.  Patient was noted to be very anxious in triage.  Of note, patient has seen Dr. Alvester Chou from cardiology recently and was thought to have atypical chest pain.  She had several cardiac catheterizations are normal as well as normal Myoview recently.  The history is provided by the patient.    Past Medical History:  Diagnosis Date  . Anginal pain ([place])   . Atypical chest pain   . Depression   . Hyperlipidemia   . Hypertension   . Kidney tumor   . Obesity     Patient Active Problem List   Diagnosis Date Noted  . Hyperlipidemia 03/02/2016  . Atypical chest pain 03/02/2016  . Ileitis   . Abdominal pain 11/11/2015  . Hypertension 11/11/2015  . Diabetes ([place]) 11/11/2015    Past Surgical History:  Procedure Laterality Date  . ABDOMINAL HYSTERECTOMY    . CARDIAC CATHETERIZATION  2009   normal coronary arteries, preserved LVEF 60%  . CHOLECYSTECTOMY  2006   Dr. Hulen Skains.  . COLONOSCOPY  2004   Dr. Collene Mares: hemorrhoids, diverticulosis, some stool present and small lesions could have been missed. repeat colonoscopy in five years.   . COLONOSCOPY  2016   Dr. Collene Mares: unremarkable per patient  . EMBOLIZATION    . HERNIA REPAIR    . TONSILLECTOMY       OB History    None      Home Medications    Prior to Admission medications   Medication Sig Start Date End Date Taking? Authorizing Provider  acyclovir (ZOVIRAX) 400 MG tablet Take 400 mg by mouth 2 (two) times daily.     [provider]  amLODipine (NORVASC) 5 MG tablet Take 5 mg by mouth daily.    [provider]  aspirin 81 MG tablet Take 81 mg by mouth daily.    [provider]  atorvastatin (LIPITOR) 20 MG tablet Take 20 mg by mouth daily.    [provider]  fluticasone (FLONASE) 50 MCG/ACT nasal spray Place 2 sprays into both nostrils as needed.    [provider]  HYDROcodone-acetaminophen (NORCO) 10-325 MG tablet Take 1 tablet by mouth every 4 (four) hours.    [provider]  lisinopril (PRINIVIL,ZESTRIL) 40 MG tablet Take 40 mg by mouth daily.    [provider]  phentermine 37.5 MG capsule Take 37.5 mg by mouth every morning.    [provider]  terbinafine (LAMISIL) 250 MG tablet terbinafine HCl 250 mg tablet    [provider]  traZODone (DESYREL) 150 MG tablet Take 150 mg by mouth at bedtime. 10/30/15   [provider]    Family History Family History  Problem Relation Age of Onset  . Diabetes Mother   .  Hypertension Mother   . Colon cancer Brother        deceased age 56  . Stroke Father   . Cancer Sister        breast  . Breast cancer Sister   . Other Son        chron's   . Breast cancer Maternal Grandmother     Social History Social History   Tobacco Use  . Smoking status: Never Smoker  . Smokeless tobacco: Never Used  Substance Use Topics  . Alcohol use: No  . Drug use: No     Allergies   Bee venom   Review of Systems Review of Systems  Respiratory: Positive for shortness of breath.   Psychiatric/Behavioral: The patient is nervous/anxious.   All other systems reviewed and are negative.    Physical Exam Updated Vital Signs Ht 5\' 8"  (1.727 m)   Wt 84.4 kg (186  lb)   BMI 28.28 kg/m   Physical Exam  Constitutional: She is oriented to person, place, and time.  Anxious, hyperventilating   HENT:  Head: Normocephalic.  Mouth/Throat: Oropharynx is clear and moist.  Eyes: Pupils are equal, round, and reactive to light. EOM are normal.  Neck: Normal range of motion. Neck supple.  Cardiovascular: Normal rate, regular rhythm and normal heart sounds.  Pulmonary/Chest:  Tachypneic, no wheezing or crackles   Abdominal: Soft. Bowel sounds are normal.  Musculoskeletal: Normal range of motion.       Right lower leg: Normal.       Left lower leg: Normal.  Neurological: She is alert and oriented to person, place, and time.  Skin: Skin is warm. Capillary refill takes less than 2 seconds.  Psychiatric: She has a normal mood and affect.  Nursing note and vitals reviewed.    ED Treatments / Results  Labs (all labs ordered are listed, but only abnormal results are displayed) Labs Reviewed  CBG MONITORING, ED - Abnormal; Notable for the following components:      Result Value   Glucose-Capillary 215 (*)    All other components within normal limits  CBC WITH DIFFERENTIAL/PLATELET  COMPREHENSIVE METABOLIC PANEL  D-DIMER, QUANTITATIVE (NOT AT [facility])  I-STAT TROPONIN, ED    EKG None  Radiology No results found.  Procedures Procedures (including critical care time)  Medications Ordered in ED Medications  sodium chloride 0.9 % bolus 1,000 mL (has no administration in time range)  LORazepam (ATIVAN) injection 1 mg (has no administration in time range)     Initial Impression / Assessment and Plan / ED Course  I have reviewed the triage vital signs and the nursing notes.  Pertinent labs & imaging results that were available during my care of the patient were reviewed by me and considered in my medical decision making (see chart for details).     [name] is a 72 y.o. female here with SOB. Likely panic attacks. She was given epi about 5  days ago and maybe she is still having some anxiety about her recent anaphylaxis bee stings. Vitals stable. Will get labs, trop x 2, D-dimer, CXR. Will give ativan and reassure patient.    8:52 PM Labs showed K 2.9, supplemented. D-dimer positive but CTA showed no PE. Felt better with ativan. Stable for discharge. Can refer to cardiology for follow up   Final Clinical Impressions(s) / ED Diagnoses   Final diagnoses:  None    ED Discharge Orders    None  Drenda Freeze, MD 05/09/18 2053

## 2018-05-09 NOTE — Discharge Instructions (Addendum)
See cardiology for follow up if you have persistent shortness of breath.   Your potassium is slightly low. Eat more foods high in potassium   Return to ER if you have worse shortness of breath, chest pain, trouble breathing

## 2018-05-09 NOTE — ED Triage Notes (Signed)
Pt arrives by [facility] after driving down the road pt began to have bilateral hand tingling and sob, on ems arrival pt was hyperventilating. EMS states they were able to coach her breathing but just PTA pt began to became anxious and tearful.

## 2018-05-17 DIAGNOSIS — M25552 Pain in left hip: Secondary | ICD-10-CM | POA: Diagnosis not present

## 2018-05-17 DIAGNOSIS — M7062 Trochanteric bursitis, left hip: Secondary | ICD-10-CM | POA: Diagnosis not present

## 2018-05-21 DIAGNOSIS — G894 Chronic pain syndrome: Secondary | ICD-10-CM | POA: Diagnosis not present

## 2018-05-21 DIAGNOSIS — I10 Essential (primary) hypertension: Secondary | ICD-10-CM | POA: Diagnosis not present

## 2018-05-21 DIAGNOSIS — M1991 Primary osteoarthritis, unspecified site: Secondary | ICD-10-CM | POA: Diagnosis not present

## 2018-05-21 DIAGNOSIS — E876 Hypokalemia: Secondary | ICD-10-CM | POA: Diagnosis not present

## 2018-05-21 DIAGNOSIS — R55 Syncope and collapse: Secondary | ICD-10-CM | POA: Diagnosis not present

## 2018-06-05 ENCOUNTER — Encounter: Payer: Self-pay | Admitting: Cardiovascular Disease

## 2018-06-05 ENCOUNTER — Ambulatory Visit: Payer: Medicare Other | Admitting: Cardiovascular Disease

## 2018-06-05 DIAGNOSIS — I10 Essential (primary) hypertension: Secondary | ICD-10-CM

## 2018-06-05 DIAGNOSIS — E7800 Pure hypercholesterolemia, unspecified: Secondary | ICD-10-CM

## 2018-06-05 MED ORDER — ATORVASTATIN CALCIUM 40 MG PO TABS: 40.0000 mg | ORAL_TABLET | Freq: Every day | ORAL | 3 refills | Status: DC
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
## 2018-06-05 NOTE — Assessment & Plan Note (Signed)
History of hyperlipidemia on atorvastatin 20 mg a day elevated LDL recently measured at 143.  We will increase this to 40 mg a day and recheck.

## 2018-06-05 NOTE — Patient Instructions (Signed)
Medication Instructions:  Your physician has recommended you make the following change in your medication:  1) INCREASE Lipitor to 40mg  tablet by mouth ONCE daily with dinner   Labwork: Your physician recommends that you return for lab work in: 3 months - FASTING   Testing/Procedures: none  Follow-Up: Your physician wants you to follow-up in: 12 months with Dr. Gwenlyn Found. You will receive a reminder letter in the mail two months in advance. If you don't receive a letter, please call our office to schedule the follow-up appointment.   Any Other Special Instructions Will Be Listed Below (If Applicable).     If you need a refill on your cardiac medications before your next appointment, please call your pharmacy.

## 2018-06-05 NOTE — Assessment & Plan Note (Signed)
History of essential hypertension her blood pressure measured at 146/84.  She is on amlodipine and lisinopril.  Continue current meds at current dosing.

## 2018-06-12 DIAGNOSIS — G894 Chronic pain syndrome: Secondary | ICD-10-CM | POA: Diagnosis not present

## 2018-06-12 DIAGNOSIS — E119 Type 2 diabetes mellitus without complications: Secondary | ICD-10-CM | POA: Diagnosis not present

## 2018-06-12 DIAGNOSIS — M1991 Primary osteoarthritis, unspecified site: Secondary | ICD-10-CM | POA: Diagnosis not present

## 2018-07-20 DIAGNOSIS — M25552 Pain in left hip: Secondary | ICD-10-CM | POA: Diagnosis not present

## 2018-07-20 DIAGNOSIS — M7062 Trochanteric bursitis, left hip: Secondary | ICD-10-CM | POA: Diagnosis not present

## 2018-08-31 DIAGNOSIS — M25522 Pain in left elbow: Secondary | ICD-10-CM | POA: Diagnosis not present

## 2018-08-31 DIAGNOSIS — M25552 Pain in left hip: Secondary | ICD-10-CM | POA: Diagnosis not present

## 2018-08-31 DIAGNOSIS — M7712 Lateral epicondylitis, left elbow: Secondary | ICD-10-CM | POA: Diagnosis not present

## 2018-08-31 DIAGNOSIS — M7062 Trochanteric bursitis, left hip: Secondary | ICD-10-CM | POA: Diagnosis not present

## 2018-09-20 DIAGNOSIS — I10 Essential (primary) hypertension: Secondary | ICD-10-CM | POA: Diagnosis not present

## 2018-09-20 DIAGNOSIS — E7800 Pure hypercholesterolemia, unspecified: Secondary | ICD-10-CM | POA: Diagnosis not present

## 2018-09-21 LAB — HEPATIC FUNCTION PANEL
ALT: 9 IU/L (ref 0–32)
AST: 12 IU/L (ref 0–40)
Albumin: 4 g/dL (ref 3.5–4.8)
Alkaline Phosphatase: 78 IU/L (ref 39–117)
Bilirubin Total: 0.4 mg/dL (ref 0.0–1.2)
Bilirubin, Direct: 0.09 mg/dL (ref 0.00–0.40)
Total Protein: 5.9 g/dL — ABNORMAL LOW (ref 6.0–8.5)

## 2018-09-21 LAB — LIPID PANEL
Chol/HDL Ratio: 2.9 ratio (ref 0.0–4.4)
Cholesterol, Total: 161 mg/dL (ref 100–199)
HDL: 55 mg/dL (ref >39)
LDL Calculated: 92 mg/dL (ref 0–99)
Triglycerides: 72 mg/dL (ref 0–149)
VLDL Cholesterol Cal: 14 mg/dL (ref 5–40)

## 2018-10-01 DIAGNOSIS — E782 Mixed hyperlipidemia: Secondary | ICD-10-CM | POA: Diagnosis not present

## 2018-10-01 DIAGNOSIS — G894 Chronic pain syndrome: Secondary | ICD-10-CM | POA: Diagnosis not present

## 2018-10-01 DIAGNOSIS — N61 Mastitis without abscess: Secondary | ICD-10-CM | POA: Diagnosis not present

## 2018-10-01 DIAGNOSIS — Z1389 Encounter for screening for other disorder: Secondary | ICD-10-CM | POA: Diagnosis not present

## 2018-10-10 DIAGNOSIS — M25552 Pain in left hip: Secondary | ICD-10-CM | POA: Diagnosis not present

## 2018-10-10 DIAGNOSIS — M7062 Trochanteric bursitis, left hip: Secondary | ICD-10-CM | POA: Diagnosis not present

## 2018-11-05 DIAGNOSIS — M25552 Pain in left hip: Secondary | ICD-10-CM | POA: Diagnosis not present

## 2018-11-05 DIAGNOSIS — G894 Chronic pain syndrome: Secondary | ICD-10-CM | POA: Diagnosis not present

## 2018-11-05 DIAGNOSIS — Z1389 Encounter for screening for other disorder: Secondary | ICD-10-CM | POA: Diagnosis not present

## 2018-11-05 DIAGNOSIS — Z0000 Encounter for general adult medical examination without abnormal findings: Secondary | ICD-10-CM | POA: Diagnosis not present

## 2018-11-21 DIAGNOSIS — M25552 Pain in left hip: Secondary | ICD-10-CM | POA: Diagnosis not present

## 2018-11-21 DIAGNOSIS — M7062 Trochanteric bursitis, left hip: Secondary | ICD-10-CM | POA: Diagnosis not present

## 2018-12-10 DIAGNOSIS — E748 Other specified disorders of carbohydrate metabolism: Secondary | ICD-10-CM | POA: Diagnosis not present

## 2018-12-10 DIAGNOSIS — G894 Chronic pain syndrome: Secondary | ICD-10-CM | POA: Diagnosis not present

## 2018-12-10 DIAGNOSIS — I10 Essential (primary) hypertension: Secondary | ICD-10-CM | POA: Diagnosis not present

## 2018-12-13 DIAGNOSIS — Z79891 Long term (current) use of opiate analgesic: Secondary | ICD-10-CM | POA: Diagnosis not present

## 2018-12-13 DIAGNOSIS — Z79899 Other long term (current) drug therapy: Secondary | ICD-10-CM | POA: Diagnosis not present

## 2018-12-13 DIAGNOSIS — G894 Chronic pain syndrome: Secondary | ICD-10-CM | POA: Diagnosis not present

## 2019-01-02 DIAGNOSIS — M7061 Trochanteric bursitis, right hip: Secondary | ICD-10-CM | POA: Diagnosis not present

## 2019-01-02 DIAGNOSIS — M25552 Pain in left hip: Secondary | ICD-10-CM | POA: Diagnosis not present

## 2019-01-02 DIAGNOSIS — M7062 Trochanteric bursitis, left hip: Secondary | ICD-10-CM | POA: Diagnosis not present

## 2019-01-02 DIAGNOSIS — M25551 Pain in right hip: Secondary | ICD-10-CM | POA: Diagnosis not present

## 2019-01-14 DIAGNOSIS — E7849 Other hyperlipidemia: Secondary | ICD-10-CM | POA: Diagnosis not present

## 2019-01-14 DIAGNOSIS — I10 Essential (primary) hypertension: Secondary | ICD-10-CM | POA: Diagnosis not present

## 2019-01-14 DIAGNOSIS — G894 Chronic pain syndrome: Secondary | ICD-10-CM | POA: Diagnosis not present

## 2019-02-20 ENCOUNTER — Telehealth: Payer: Self-pay | Admitting: Cardiovascular Disease

## 2019-02-20 NOTE — Telephone Encounter (Signed)
LVM to call and schedule 1 year followup with Dr. Gwenlyn Found.

## 2019-02-27 DIAGNOSIS — K529 Noninfective gastroenteritis and colitis, unspecified: Secondary | ICD-10-CM | POA: Diagnosis not present

## 2019-02-27 DIAGNOSIS — B351 Tinea unguium: Secondary | ICD-10-CM | POA: Diagnosis not present

## 2019-02-27 DIAGNOSIS — I10 Essential (primary) hypertension: Secondary | ICD-10-CM | POA: Diagnosis not present

## 2019-02-27 DIAGNOSIS — E748 Other specified disorders of carbohydrate metabolism: Secondary | ICD-10-CM | POA: Diagnosis not present

## 2019-02-27 DIAGNOSIS — R197 Diarrhea, unspecified: Secondary | ICD-10-CM | POA: Diagnosis not present

## 2019-02-27 DIAGNOSIS — Z79899 Other long term (current) drug therapy: Secondary | ICD-10-CM | POA: Diagnosis not present

## 2019-02-28 DIAGNOSIS — R197 Diarrhea, unspecified: Secondary | ICD-10-CM | POA: Diagnosis not present

## 2019-02-28 DIAGNOSIS — Z79899 Other long term (current) drug therapy: Secondary | ICD-10-CM | POA: Diagnosis not present

## 2019-03-06 ENCOUNTER — Encounter (INDEPENDENT_AMBULATORY_CARE_PROVIDER_SITE_OTHER): Payer: Self-pay | Admitting: Internal Medicine

## 2019-03-06 ENCOUNTER — Ambulatory Visit (INDEPENDENT_AMBULATORY_CARE_PROVIDER_SITE_OTHER): Payer: Medicare Other | Admitting: Internal Medicine

## 2019-03-06 ENCOUNTER — Other Ambulatory Visit: Payer: Self-pay

## 2019-03-06 VITALS — BP 202/97 | HR 77 | Temp 98.1°F | Ht 68.0 in | Wt 187.4 lb

## 2019-03-06 DIAGNOSIS — A09 Infectious gastroenteritis and colitis, unspecified: Secondary | ICD-10-CM | POA: Diagnosis not present

## 2019-03-06 NOTE — Patient Instructions (Signed)
GI pathogen Imodium: One in am and one in pm.

## 2019-03-06 NOTE — Progress Notes (Signed)
   Subjective:    Patient ID: [name], female    DOB: 1946-03-28, 73 y.o.   MRN: [id number]  HPI Referred by Dr. Gerarda Fraction for diarrhea, possible TCS. Has had diarrhea and mucus in stool for about a month. She tells me she started having diarrhea for about a month. She says her stools are watery. Yesterday, her stool was loose but not watery. She has tried [employer] which has helped.  Normally she has one stool a day.  She has not been on any recent antibiotics. Has not had a fever.  Her appetite has been okay.   Brother had colon cancer and died at age 35 from colon cancer.   Her last colonoscopy was in 2016 by Dr. Collene Mares and she had a polyp.   03/02/2019 H and H 12.4 and 36.6   Review of Systems Past Medical History:  Diagnosis Date  . Anginal pain ([place])   . Atypical chest pain   . Depression   . Hyperlipidemia   . Hypertension   . Kidney tumor   . Obesity     Past Surgical History:  Procedure Laterality Date  . ABDOMINAL HYSTERECTOMY    . CARDIAC CATHETERIZATION  2009   normal coronary arteries, preserved LVEF 60%  . CHOLECYSTECTOMY  2006   Dr. Hulen Skains.  . COLONOSCOPY  2004   Dr. Collene Mares: hemorrhoids, diverticulosis, some stool present and small lesions could have been missed. repeat colonoscopy in five years.   . COLONOSCOPY  2016   Dr. Collene Mares: unremarkable per patient  . EMBOLIZATION    . HERNIA REPAIR    . TONSILLECTOMY      Allergies  Allergen Reactions  . Bee Venom Anaphylaxis    Current Outpatient Medications on File Prior to Visit  Medication Sig Dispense Refill  . acyclovir (ZOVIRAX) 400 MG tablet Take 400 mg by mouth 2 (two) times daily.     Marland Kitchen amLODipine (NORVASC) 5 MG tablet Take 5 mg by mouth daily.    Marland Kitchen atorvastatin (LIPITOR) 40 MG tablet Take 1 tablet (40 mg total) by mouth daily. 90 tablet 3  . fluticasone (FLONASE) 50 MCG/ACT nasal spray Place 2 sprays into both nostrils as needed.    Marland Kitchen HYDROcodone-acetaminophen (NORCO) 10-325 MG tablet Take 1  tablet by mouth every 4 (four) hours.    Marland Kitchen lisinopril (PRINIVIL,ZESTRIL) 40 MG tablet Take 40 mg by mouth daily.    . Multiple Vitamins-Minerals (PRESERVISION AREDS 2 PO) Take by mouth.    . traZODone (DESYREL) 150 MG tablet Take 150 mg by mouth at bedtime.     No current facility-administered medications on file prior to visit.         Objective:   Physical Exam Blood pressure (!) 202/97, pulse 77, temperature 98.1 F (36.7 C), height 5\' 8"  (1.727 m), weight 187 lb 6.4 oz (85 kg). Alert and oriented. Skin warm and dry. Oral mucosa is moist.   . Sclera anicteric, conjunctivae is pink. Thyroid not enlarged. No cervical lymphadenopathy. Lungs clear. Heart regular rate and rhythm.  Abdomen is soft. Bowel sounds are positive. No hepatomegaly. No abdominal masses felt. Epigastric  tenderness.  No edema to lower extremities.           Assessment & Plan:  Diarrhea. Am going to get a GI pathogen.  Imodium as needed.

## 2019-03-07 ENCOUNTER — Other Ambulatory Visit: Payer: Self-pay | Admitting: Internal Medicine

## 2019-03-07 DIAGNOSIS — Z1231 Encounter for screening mammogram for malignant neoplasm of breast: Secondary | ICD-10-CM

## 2019-03-14 ENCOUNTER — Telehealth: Payer: Self-pay | Admitting: Cardiovascular Disease

## 2019-03-14 DIAGNOSIS — Z86010 Personal history of colonic polyps: Secondary | ICD-10-CM | POA: Diagnosis not present

## 2019-03-14 DIAGNOSIS — Z1211 Encounter for screening for malignant neoplasm of colon: Secondary | ICD-10-CM | POA: Diagnosis not present

## 2019-03-14 DIAGNOSIS — Z800 Family history of malignant neoplasm of digestive organs: Secondary | ICD-10-CM | POA: Diagnosis not present

## 2019-03-14 DIAGNOSIS — K5730 Diverticulosis of large intestine without perforation or abscess without bleeding: Secondary | ICD-10-CM | POA: Diagnosis not present

## 2019-03-14 DIAGNOSIS — R194 Change in bowel habit: Secondary | ICD-10-CM | POA: Diagnosis not present

## 2019-03-14 NOTE — Telephone Encounter (Signed)
New Message ° ° ° °Pt is returning call  ° ° ° °Please call back  °

## 2019-03-14 NOTE — Telephone Encounter (Signed)
Mychart, smartphone (prefers telephone visit) consent sent via mychart msg, pre reg complete 03/14/19 AF

## 2019-03-14 NOTE — Telephone Encounter (Signed)

## 2019-03-15 ENCOUNTER — Other Ambulatory Visit: Payer: Self-pay

## 2019-03-15 ENCOUNTER — Encounter: Payer: Self-pay | Admitting: Cardiovascular Disease

## 2019-03-15 ENCOUNTER — Telehealth: Payer: Self-pay

## 2019-03-15 ENCOUNTER — Telehealth (INDEPENDENT_AMBULATORY_CARE_PROVIDER_SITE_OTHER): Payer: Medicare Other | Admitting: Cardiovascular Disease

## 2019-03-15 VITALS — BP 202/80 | HR 68 | Ht 68.0 in | Wt 187.0 lb

## 2019-03-15 DIAGNOSIS — R0789 Other chest pain: Secondary | ICD-10-CM | POA: Diagnosis not present

## 2019-03-15 DIAGNOSIS — K5730 Diverticulosis of large intestine without perforation or abscess without bleeding: Secondary | ICD-10-CM | POA: Diagnosis not present

## 2019-03-15 DIAGNOSIS — E782 Mixed hyperlipidemia: Secondary | ICD-10-CM

## 2019-03-15 DIAGNOSIS — Z1211 Encounter for screening for malignant neoplasm of colon: Secondary | ICD-10-CM | POA: Diagnosis not present

## 2019-03-15 DIAGNOSIS — Z79899 Other long term (current) drug therapy: Secondary | ICD-10-CM

## 2019-03-15 DIAGNOSIS — Z86010 Personal history of colonic polyps: Secondary | ICD-10-CM | POA: Diagnosis not present

## 2019-03-15 DIAGNOSIS — I10 Essential (primary) hypertension: Secondary | ICD-10-CM | POA: Diagnosis not present

## 2019-03-15 DIAGNOSIS — R194 Change in bowel habit: Secondary | ICD-10-CM | POA: Diagnosis not present

## 2019-03-15 DIAGNOSIS — Z800 Family history of malignant neoplasm of digestive organs: Secondary | ICD-10-CM | POA: Diagnosis not present

## 2019-03-15 MED ORDER — HYDROCHLOROTHIAZIDE 12.5 MG PO CAPS: 12.5000 mg | ORAL_CAPSULE | Freq: Every day | ORAL | 3 refills | Status: DC

## 2019-03-15 MED ORDER — HYDROCHLOROTHIAZIDE 12.5 MG PO CAPS: 12.5000 mg | ORAL_CAPSULE | Freq: Every day | ORAL | 0 refills | Status: DC

## 2019-03-15 NOTE — Patient Instructions (Addendum)
Medication Instructions: Your physician has recommended you make the following change in your medication:  START HYDROCHLOROTHIAZIDE 12.5 MG BY MOUTH DAILY  If you need a refill on your cardiac medications before your next appointment, please call your pharmacy.   Lab work: Your physician recommends that you return for lab work in 7-10 days. You will receive a lab slip in the mail. BASIC METABOLIC PANEL  If you have labs (blood work) drawn today and your tests are completely normal, you will receive your results only by: Marland Kitchen MyChart Message (if you have MyChart) OR . A paper copy in the mail If you have any lab test that is abnormal or we need to change your treatment, we will call you to review the results.  Testing/Procedures: NONE  Follow-Up: At [facility], you and your health needs are our priority.  As part of our continuing mission to provide you with exceptional heart care, we have created designated Provider Care Teams.  These Care Teams include your primary Cardiologist (physician) and Advanced Practice Providers (APPs -  Physician Assistants and Nurse Practitioners) who all work together to provide you with the care you need, when you need it. . You will need a follow up appointment in 6 months.  Please call our office 2 months in advance to schedule this appointment.  You may see Dr. Gwenlyn Found or one of the following Advanced Practice Providers on your designated Care Team:   . Kerin Ransom, [place] . Almyra Deforest, PA-C . Fabian Sharp, PA-C . Jory Sims, DNP . Rosaria Ferries, PA-C . Roby Lofts, PA-C . Sande Rives, PA-C  Any Other Special Instructions Will Be Listed Below (If Applicable). KEEP A  BLOOD PRESSURE LOG FOR 30 DAYS THEN FOLLOW UP WITH A CLINICAL PHARMACIST IN THE HYPERTENSION CLINIC. YOU WILL NEED AN APPOINTMENT.
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
## 2019-03-15 NOTE — Telephone Encounter (Signed)
Patient and/or DPR-approved person aware of AVS instructions and verbalized understanding. AVS released to [employer]

## 2019-03-18 ENCOUNTER — Ambulatory Visit (HOSPITAL_COMMUNITY)
Admission: RE | Admit: 2019-03-18 | Discharge: 2019-03-18 | Disposition: A | Discharge status: Home / Self Care | Payer: Medicare Other | Source: Ambulatory Visit | Attending: Internal Medicine | Admitting: Internal Medicine

## 2019-03-18 ENCOUNTER — Other Ambulatory Visit: Payer: Self-pay

## 2019-03-18 ENCOUNTER — Other Ambulatory Visit (HOSPITAL_COMMUNITY): Payer: Self-pay | Admitting: Internal Medicine

## 2019-03-18 DIAGNOSIS — S299XXA Unspecified injury of thorax, initial encounter: Secondary | ICD-10-CM | POA: Diagnosis not present

## 2019-03-18 DIAGNOSIS — Z1389 Encounter for screening for other disorder: Secondary | ICD-10-CM | POA: Diagnosis not present

## 2019-03-18 DIAGNOSIS — I10 Essential (primary) hypertension: Secondary | ICD-10-CM | POA: Diagnosis not present

## 2019-03-18 DIAGNOSIS — E119 Type 2 diabetes mellitus without complications: Secondary | ICD-10-CM | POA: Diagnosis not present

## 2019-03-18 DIAGNOSIS — R0781 Pleurodynia: Secondary | ICD-10-CM | POA: Diagnosis not present

## 2019-03-18 DIAGNOSIS — R600 Localized edema: Secondary | ICD-10-CM | POA: Diagnosis not present

## 2019-03-18 DIAGNOSIS — S2232XA Fracture of one rib, left side, initial encounter for closed fracture: Secondary | ICD-10-CM | POA: Diagnosis not present

## 2019-04-22 DIAGNOSIS — M7062 Trochanteric bursitis, left hip: Secondary | ICD-10-CM | POA: Diagnosis not present

## 2019-04-22 DIAGNOSIS — R7309 Other abnormal glucose: Secondary | ICD-10-CM | POA: Diagnosis not present

## 2019-04-22 DIAGNOSIS — I10 Essential (primary) hypertension: Secondary | ICD-10-CM | POA: Diagnosis not present

## 2019-04-22 DIAGNOSIS — G894 Chronic pain syndrome: Secondary | ICD-10-CM | POA: Diagnosis not present

## 2019-04-22 DIAGNOSIS — E748 Other specified disorders of carbohydrate metabolism: Secondary | ICD-10-CM | POA: Diagnosis not present

## 2019-04-25 ENCOUNTER — Telehealth: Payer: Self-pay

## 2019-04-25 ENCOUNTER — Telehealth: Payer: Self-pay | Admitting: Pharmacist

## 2019-04-25 NOTE — Telephone Encounter (Signed)

## 2019-04-25 NOTE — Telephone Encounter (Signed)
Patient calling to report swelling feet.   Noted she is on amlodipine 10mg  daily and HCTZ 12.5mg  daily as well.  Patient stated her lower extremity edema is a recent problem but her amlodipine 10mg  daily was stated over 5 years ago.   Recommendation:  Increase HCTZ to 25mg  daily  Monitor BP daily  F/u with HCT clinic on 05/02/2019 as scheduled   Plan to repeat blood work and adjust therapy during [place] on 05/02/2019.

## 2019-04-30 ENCOUNTER — Ambulatory Visit
Admission: RE | Admit: 2019-04-30 | Discharge: 2019-04-30 | Disposition: A | Discharge status: Home / Self Care | Payer: Medicare Other | Source: Ambulatory Visit | Attending: Internal Medicine | Admitting: Internal Medicine

## 2019-04-30 ENCOUNTER — Other Ambulatory Visit: Payer: Self-pay

## 2019-04-30 DIAGNOSIS — Z1231 Encounter for screening mammogram for malignant neoplasm of breast: Secondary | ICD-10-CM

## 2019-05-01 ENCOUNTER — Other Ambulatory Visit: Payer: Self-pay | Admitting: Internal Medicine

## 2019-05-01 DIAGNOSIS — R928 Other abnormal and inconclusive findings on diagnostic imaging of breast: Secondary | ICD-10-CM

## 2019-05-02 ENCOUNTER — Ambulatory Visit: Payer: Medicare Other

## 2019-05-02 ENCOUNTER — Telehealth: Payer: Self-pay

## 2019-05-02 NOTE — Telephone Encounter (Signed)
Called to reschedule due to raquel getting tested for covid pt compliant and rescheduled

## 2019-05-06 ENCOUNTER — Ambulatory Visit
Admission: RE | Admit: 2019-05-06 | Discharge: 2019-05-06 | Disposition: A | Discharge status: Home / Self Care | Payer: Medicare Other | Source: Ambulatory Visit | Attending: Internal Medicine | Admitting: Internal Medicine

## 2019-05-06 DIAGNOSIS — R928 Other abnormal and inconclusive findings on diagnostic imaging of breast: Secondary | ICD-10-CM

## 2019-05-06 DIAGNOSIS — R922 Inconclusive mammogram: Secondary | ICD-10-CM | POA: Diagnosis not present

## 2019-05-06 DIAGNOSIS — N6001 Solitary cyst of right breast: Secondary | ICD-10-CM | POA: Diagnosis not present

## 2019-05-15 DIAGNOSIS — Z13820 Encounter for screening for osteoporosis: Secondary | ICD-10-CM | POA: Diagnosis not present

## 2019-05-20 ENCOUNTER — Ambulatory Visit (HOSPITAL_COMMUNITY)
Admission: RE | Admit: 2019-05-20 | Discharge: 2019-05-20 | Disposition: A | Discharge status: Home / Self Care | Payer: Medicare Other | Source: Ambulatory Visit | Attending: Internal Medicine | Admitting: Internal Medicine

## 2019-05-20 ENCOUNTER — Other Ambulatory Visit: Payer: Self-pay

## 2019-05-20 ENCOUNTER — Other Ambulatory Visit (HOSPITAL_COMMUNITY): Payer: Self-pay | Admitting: Internal Medicine

## 2019-05-20 DIAGNOSIS — S299XXA Unspecified injury of thorax, initial encounter: Secondary | ICD-10-CM | POA: Diagnosis not present

## 2019-05-20 DIAGNOSIS — R42 Dizziness and giddiness: Secondary | ICD-10-CM | POA: Diagnosis not present

## 2019-05-20 DIAGNOSIS — R0781 Pleurodynia: Secondary | ICD-10-CM | POA: Diagnosis not present

## 2019-05-20 DIAGNOSIS — N390 Urinary tract infection, site not specified: Secondary | ICD-10-CM | POA: Diagnosis not present

## 2019-05-20 DIAGNOSIS — I10 Essential (primary) hypertension: Secondary | ICD-10-CM | POA: Diagnosis not present

## 2019-05-22 ENCOUNTER — Emergency Department (HOSPITAL_COMMUNITY)
Admission: EM | Admit: 2019-05-22 | Discharge: 2019-05-22 | Disposition: A | Discharge status: Home / Self Care | Payer: Medicare Other | Attending: Emergency Medicine | Admitting: Emergency Medicine

## 2019-05-22 ENCOUNTER — Ambulatory Visit (INDEPENDENT_AMBULATORY_CARE_PROVIDER_SITE_OTHER): Payer: Medicare Other | Admitting: Pharmacist Clinician (PhC)/ Clinical Pharmacy Specialist

## 2019-05-22 ENCOUNTER — Emergency Department (HOSPITAL_COMMUNITY): Payer: Medicare Other

## 2019-05-22 ENCOUNTER — Other Ambulatory Visit: Payer: Self-pay

## 2019-05-22 ENCOUNTER — Encounter (HOSPITAL_COMMUNITY): Payer: Self-pay | Admitting: Emergency Medicine

## 2019-05-22 DIAGNOSIS — R109 Unspecified abdominal pain: Secondary | ICD-10-CM | POA: Diagnosis not present

## 2019-05-22 DIAGNOSIS — I10 Essential (primary) hypertension: Secondary | ICD-10-CM | POA: Insufficient documentation

## 2019-05-22 DIAGNOSIS — R1031 Right lower quadrant pain: Secondary | ICD-10-CM | POA: Diagnosis not present

## 2019-05-22 DIAGNOSIS — Z79899 Other long term (current) drug therapy: Secondary | ICD-10-CM | POA: Insufficient documentation

## 2019-05-22 DIAGNOSIS — N2889 Other specified disorders of kidney and ureter: Secondary | ICD-10-CM | POA: Diagnosis not present

## 2019-05-22 DIAGNOSIS — E119 Type 2 diabetes mellitus without complications: Secondary | ICD-10-CM | POA: Insufficient documentation

## 2019-05-22 LAB — CBC
HCT: 37.5 % (ref 36.0–46.0)
Hemoglobin: 12.4 g/dL (ref 12.0–15.0)
MCH: 29.7 pg (ref 26.0–34.0)
MCHC: 33.1 g/dL (ref 30.0–36.0)
MCV: 89.9 fL (ref 80.0–100.0)
Platelets: 268 10*3/uL (ref 150–400)
RBC: 4.17 MIL/uL (ref 3.87–5.11)
RDW: 13.2 % (ref 11.5–15.5)
WBC: 7.6 10*3/uL (ref 4.0–10.5)
nRBC: 0 % (ref 0.0–0.2)

## 2019-05-22 LAB — BASIC METABOLIC PANEL
Anion gap: 9 (ref 5–15)
BUN: 30 mg/dL — ABNORMAL HIGH (ref 8–23)
CO2: 29 mmol/L (ref 22–32)
Calcium: 9.3 mg/dL (ref 8.9–10.3)
Chloride: 103 mmol/L (ref 98–111)
Creatinine, Ser: 1.08 mg/dL — ABNORMAL HIGH (ref 0.44–1.00)
GFR calc Af Amer: 59 mL/min — ABNORMAL LOW (ref >60)
GFR calc non Af Amer: 51 mL/min — ABNORMAL LOW (ref >60)
Glucose, Bld: 143 mg/dL — ABNORMAL HIGH (ref 70–99)
Potassium: 3.6 mmol/L (ref 3.5–5.1)
Sodium: 141 mmol/L (ref 135–145)

## 2019-05-22 LAB — URINALYSIS, ROUTINE W REFLEX MICROSCOPIC
Bilirubin Urine: NEGATIVE
Glucose, UA: NEGATIVE mg/dL
Hgb urine dipstick: NEGATIVE
Ketones, ur: NEGATIVE mg/dL
Nitrite: NEGATIVE
Protein, ur: NEGATIVE mg/dL
Specific Gravity, Urine: 1.009 (ref 1.005–1.030)
pH: 5 (ref 5.0–8.0)

## 2019-05-22 MED ORDER — FENTANYL CITRATE (PF) 100 MCG/2ML IJ SOLN
100.0000 ug | Freq: Once | INTRAMUSCULAR | Status: AC
  Administered 2019-05-22: 100 ug via INTRAVENOUS
  Filled 2019-05-22: qty 2

## 2019-05-22 MED ORDER — KETOROLAC TROMETHAMINE 30 MG/ML IJ SOLN
15.0000 mg | Freq: Once | INTRAMUSCULAR | Status: AC
  Administered 2019-05-22: 04:00:00 15 mg via INTRAVENOUS
  Filled 2019-05-22: qty 1

## 2019-05-22 MED ORDER — SODIUM CHLORIDE 0.9 % IV BOLUS (SEPSIS)
1000.0000 mL | Freq: Once | INTRAVENOUS | Status: AC
  Administered 2019-05-22: 1000 mL via INTRAVENOUS

## 2019-05-22 MED ORDER — HYDROMORPHONE HCL 1 MG/ML IJ SOLN
1.0000 mg | Freq: Once | INTRAMUSCULAR | Status: AC
  Administered 2019-05-22: 1 mg via INTRAVENOUS
  Filled 2019-05-22: qty 1

## 2019-05-22 MED ORDER — MORPHINE SULFATE (PF) 4 MG/ML IV SOLN
4.0000 mg | Freq: Once | INTRAVENOUS | Status: AC
  Administered 2019-05-22: 4 mg via INTRAVENOUS
  Filled 2019-05-22: qty 1

## 2019-05-22 MED ORDER — ONDANSETRON HCL 4 MG/2ML IJ SOLN
4.0000 mg | Freq: Once | INTRAMUSCULAR | Status: AC
  Administered 2019-05-22: 4 mg via INTRAVENOUS
  Filled 2019-05-22: qty 2

## 2019-05-22 NOTE — ED Provider Notes (Signed)
[facility] EMERGENCY DEPARTMENT Provider Note   CSN: [id number] Arrival date & time: 05/22/19  0000     History   Chief Complaint Chief Complaint  Patient presents with   Flank Pain    HPI [name] is a 73 y.o. female.     The history is provided by the patient.  Flank Pain This is a new problem. The current episode started more than 2 days ago. The problem occurs daily. The problem has been rapidly worsening. Pertinent negatives include no chest pain, no abdominal pain and no shortness of breath. Exacerbated by: Movement. The symptoms are relieved by rest. She has tried rest for the symptoms. The treatment provided no relief.   Patient with history of depression, hyperlipidemia, hypertension presents with right flank pain.  She reports since Sunday she has been having significant pain in her right flank.  No fevers or vomiting.  No chest pain or shortness of breath.  No other abdominal pain.  The pain is worsening.  Seen by PCP, who ordered x-rays at [facility] and was given a shot.  Her pain continues.  She reports history of previous issues to her left kidney, but never on her right.  She also reports she fell recently on her right side, but does not think she injured anything  Patient reports that her PCP did put her on antibiotics for presumed urinary tract infection, but she was unable to get them filled Past Medical History:  Diagnosis Date   Anginal pain ([place])    Atypical chest pain    Depression    Hyperlipidemia    Hypertension    Kidney tumor    Obesity     Patient Active Problem List   Diagnosis Date Noted   Hyperlipidemia 03/02/2016   Atypical chest pain 03/02/2016   Ileitis    Abdominal pain 11/11/2015   Hypertension 11/11/2015   Diabetes ([place]) 11/11/2015    Past Surgical History:  Procedure Laterality Date   ABDOMINAL HYSTERECTOMY     CARDIAC CATHETERIZATION  2009   normal coronary arteries, preserved  LVEF 60%   CHOLECYSTECTOMY  2006   Dr. Hulen Skains.   COLONOSCOPY  2004   Dr. Collene Mares: hemorrhoids, diverticulosis, some stool present and small lesions could have been missed. repeat colonoscopy in five years.    COLONOSCOPY  2016   Dr. Collene Mares: unremarkable per patient   EMBOLIZATION     HERNIA REPAIR     TONSILLECTOMY       OB History   No obstetric history on file.      Home Medications    Prior to Admission medications   Medication Sig Start Date End Date Taking? Authorizing Provider  acyclovir (ZOVIRAX) 400 MG tablet Take 400 mg by mouth 2 (two) times daily.     [provider]  amLODipine (NORVASC) 10 MG tablet Take 10 mg by mouth daily.     [provider]  atorvastatin (LIPITOR) 20 MG tablet Take 20 mg by mouth daily.    [provider]  DULoxetine (CYMBALTA) 30 MG capsule Take 1 capsule by mouth daily.    [provider]  fluticasone (FLONASE) 50 MCG/ACT nasal spray Place 2 sprays into both nostrils as needed.    [provider]  hydrochlorothiazide (MICROZIDE) 12.5 MG capsule Take 1 capsule (12.5 mg total) by mouth daily. 03/15/19   Lorretta Harp, MD  HYDROcodone-acetaminophen (NORCO) 10-325 MG tablet Take 1 tablet by mouth every 4 (four) hours.  [provider]  lisinopril (PRINIVIL,ZESTRIL) 40 MG tablet Take 40 mg by mouth daily.    [provider]  Multiple Vitamins-Minerals (PRESERVISION AREDS 2 PO) Take by mouth.    [provider]  traZODone (DESYREL) 150 MG tablet Take 150 mg by mouth at bedtime. 10/30/15   [provider]    Family History Family History  Problem Relation Age of Onset   Diabetes Mother    Hypertension Mother    Colon cancer Brother        deceased age 30   Stroke Father    Cancer Sister        breast   Breast cancer Sister    Other Son        chron's    Breast cancer Maternal Grandmother     Social History Social History   Tobacco Use    Smoking status: Never Smoker   Smokeless tobacco: Never Used  Substance Use Topics   Alcohol use: No   Drug use: No     Allergies   Bee venom   Review of Systems Review of Systems  Constitutional: Negative for fever.  Respiratory: Negative for shortness of breath.   Cardiovascular: Negative for chest pain.  Gastrointestinal: Negative for abdominal pain.  Genitourinary: Positive for flank pain.  Neurological: Negative for weakness and numbness.  Psychiatric/Behavioral: The patient is nervous/anxious.   All other systems reviewed and are negative.    Physical Exam Updated Vital Signs BP 133/82 (BP Location: Right Arm)    Pulse 69    Temp 98.3 F (36.8 C) (Oral)    Resp (!) 21    SpO2 99%   Physical Exam  CONSTITUTIONAL: Well developed/well nourished, anxious tearful HEAD: Normocephalic/atraumatic EYES: EOMI/PERRL ENMT: Mucous membranes moist NECK: supple no meningeal signs SPINE/BACK:entire spine nontender CV: S1/S2 noted, no murmurs/rubs/gallops noted LUNGS: Lungs are clear to auscultation bilaterally, no apparent distress ABDOMEN: soft, nontender, no rebound or guarding, bowel sounds noted throughout abdomen GU: Right Cva tenderness, no bruising, no rash, no erythema NEURO: Pt is awake/alert/appropriate, moves all extremitiesx4.  No facial droop.   EXTREMITIES: pulses normal/equal, full ROM SKIN: warm, color normal PSYCH: Anxious and tearful  ED Treatments / Results  Labs (all labs ordered are listed, but only abnormal results are displayed) Labs Reviewed  URINALYSIS, ROUTINE W REFLEX MICROSCOPIC - Abnormal; Notable for the following components:      Result Value   Color, Urine STRAW (*)    Leukocytes,Ua TRACE (*)    Bacteria, UA RARE (*)    All other components within normal limits  BASIC METABOLIC PANEL - Abnormal; Notable for the following components:   Glucose, Bld 143 (*)    BUN 30 (*)    Creatinine, Ser 1.08 (*)    GFR calc non Af Amer 51 (*)     GFR calc Af Amer 59 (*)    All other components within normal limits  URINE CULTURE  CBC    EKG None  Radiology Dg Ribs Unilateral Right  Result Date: 05/20/2019 CLINICAL DATA:  Fall on right side several days ago.  Pain. EXAM: RIGHT RIBS - 2 VIEW COMPARISON:  None. FINDINGS: No fracture or other bone lesions are seen involving the ribs. IMPRESSION: Negative. Electronically Signed   By: Dorise Bullion III M.D   On: 05/20/2019 10:24   Ct Renal Stone Study  Result Date: 05/22/2019 CLINICAL DATA:  73 year old female with right flank pain. Concern for kidney stone. EXAM: CT ABDOMEN AND PELVIS  WITHOUT CONTRAST TECHNIQUE: Multidetector CT imaging of the abdomen and pelvis was performed following the standard protocol without IV contrast. COMPARISON:  CT of the abdomen pelvis dated 11/11/2015 FINDINGS: Evaluation of this exam is limited in the absence of intravenous contrast. Lower chest: The visualized lung bases are clear. No intra-abdominal free air or free fluid. Hepatobiliary: The liver is unremarkable. No intrahepatic biliary ductal dilatation. Cholecystectomy. No retained calcified stone noted in the central CBD. Pancreas: Unremarkable. No pancreatic ductal dilatation or surrounding inflammatory changes. Spleen: Normal in size without focal abnormality. Adrenals/Urinary Tract: There is a stable 3 cm right adrenal adenoma. The left adrenal gland is unremarkable. Areas of scarring in the upper pole of the left kidney and coiling material likely related to prior embolization. Subcentimeter hypodense focus with fatty attenuation in the superior pole of the left kidney similar to prior CT, likely small angiomyolipoma. A 2.4 cm ovoid structure superior to the upper pole of the left kidney with a thin rim of calcification. This lesion is not characterized on this noncontrast CT. This may represent a complex cystic lesion or a focus of prior fat necrosis. There is no hydronephrosis or nephrolithiasis. The  right kidney is unremarkable. The visualized ureters and urinary bladder appear unremarkable. Stomach/Bowel: There is a moderate amount of stool throughout the colon. There is no bowel obstruction or active inflammation. Several small sigmoid diverticula without active inflammatory changes. The appendix is normal. Vascular/Lymphatic: The abdominal aorta and IVC are unremarkable. No portal venous gas. There is no adenopathy. Reproductive: Hysterectomy. No pelvic mass. Other: None Musculoskeletal: Osteopenia with degenerative changes of the lower spine. L2 hemangioma. No acute osseous pathology. IMPRESSION: 1. No acute intra-abdominal or pelvic pathology. No hydronephrosis or nephrolithiasis. 2. Left renal upper pole scarring likely related to prior coiling. 3. Stable small left renal upper pole angiomyolipoma. Additional ovoid exophytic lesion superior to the upper pole of the left kidney is not characterized but may represent a focal area of fat infarct. This is relatively stable since the study of 2017. 4. Stable right adrenal adenoma. 5. Moderate colonic stool burden. No bowel obstruction or active inflammation. Normal appendix. Electronically Signed   By: Anner Crete M.D.   On: 05/22/2019 03:02    Procedures Procedures   Medications Ordered in ED Medications  fentaNYL (SUBLIMAZE) injection 100 mcg (100 mcg Intravenous Given 05/22/19 0134)  ondansetron (ZOFRAN) injection 4 mg (4 mg Intravenous Given 05/22/19 0135)  sodium chloride 0.9 % bolus 1,000 mL (1,000 mLs Intravenous New Bag/Given 05/22/19 0141)  morphine 4 MG/ML injection 4 mg (4 mg Intravenous Given 05/22/19 0246)  HYDROmorphone (DILAUDID) injection 1 mg (1 mg Intravenous Given 05/22/19 0313)     Initial Impression / Assessment and Plan / ED Course  I have reviewed the triage vital signs and the nursing notes.  Pertinent labs & imaging results that were available during my care of the patient were reviewed by me and considered in my  medical decision making (see chart for details).        1:45 AM Patient reports right flank pain. Seen by PCP and had outpatient x-rays, which apparently were right rib films were negative on July 20 Due to persistent flank pain, will proceed with CT renal 5:24 AM Extensive work-up did not reveal any acute issue.  Recent chest x-ray did not reveal rib fracture.  CT renal tonight does not reveal any acute pathology.  Final Clinical Impressions(s) / ED Diagnoses  Patient reports that her PCP is already placed her  on antibiotics for possible UTI, this is at her pharmacy. Urine culture sent, and I encouraged her to take her antibiotics. When I reviewed narcotic database, patient receives narcotics every month therefore she should have plenty of medicines at home She is otherwise appropriate for discharge home Final diagnoses:  Flank pain  Right flank pain    ED Discharge Orders    None       Ripley Fraise, MD 05/22/19 [phone]

## 2019-05-22 NOTE — Progress Notes (Signed)
05/22/2019 [name] Mar 07, 1946 [id number]   HPI:  [name] is a 73 y.o. female patient of Dr [name], with a PMH below who presents today for hypertension clinic evaluation.  In addition to hypertension, her medical history is significant for hyperlipidemia, DM2 (no current A1c on file) and anxiety.  She was seen by Dr. Gwenlyn Leonard via telemedicine in May and reported home BP readings as high as 628 systolic.  She was given hctz 12.5 mg to take in addition to her amlodipine 10 mg and lisinopril 40 mg.  About 5 weeks later patient called to report a new problem of lower extremity edema.   She was told to increase hctz to 25 mg and was scheduled with an appointment one week later.  That appointment was cancelled due to [place] staff shortages and rescheduled for today.  Of note the patient went to the ED last night with flank pain (had diagnosed UTI but unable to get antibiotic from pharmacy), was discharged at 5:45 am this morning.  Her BP last night was 133/82.  Today she is doing well, managed to get a nap in after leaving ED this am.  She states that she was told to increase furosemide from 2 tabs (40 mg) daily to 2 tabs in the morning and 1 in the afternoon.  Has been doing this for 2-3 weeks and notes the edema is mostly gone.  She apparently never increased the hctz to 25 mg daily.  Labs drawn at ED this am show stable electrolytes.  BUN was elevated, could be due to probable UTI.    Blood Pressure Goal:  130/80  Current Medications:  Amlodipine 10 mg qd, hctz 12.5 mg qd, lisinopril 40 mg qd, furosemide 40 mg am 20 mg pm  Family Hx:  Mother died at 32, father died at 44 had some heart disease, died from kidney failure; brother deceased at 58 from colon cancer, one sister with breast cancer (still living), other sisters healthy Children - son homeless/drugs in [place]; other son no health issues  Social Hx:  No tobacco or alcohol; 1 coffee max per day, rare tea only occasional soda  Diet:   Cooks meals at home, cutting back on salt with cooking, none at table. Ms. [name]; eats plenty of fresh salads; chicken and salmon, red meat regularly; has air fryer  Exercise: has problems with chronic hip/back pain, notes that now she is no longer going to gym, she is feeling better and having less pain.    Home BP readings:  Over the past 2 weeks had 10 readings, average 121/65  Intolerances: nkda  Labs: 05/2019: Na 141, K 3.6, Glu 143, BUN 30, SCr 1.08  Wt Readings from Last 3 Encounters:  05/22/19 194 lb 11.2 oz (88.3 kg)  03/15/19 187 lb (84.8 kg)  03/06/19 187 lb 6.4 oz (85 kg)   BP Readings from Last 3 Encounters:  05/22/19 136/60  05/22/19 (!) 133/52  03/15/19 (!) 202/80   Pulse Readings from Last 3 Encounters:  05/22/19 64  05/22/19 70  03/15/19 68    Current Outpatient Medications  Medication Sig Dispense Refill  . acyclovir (ZOVIRAX) 400 MG tablet Take 400 mg by mouth 2 (two) times daily.     Marland Kitchen amLODipine (NORVASC) 10 MG tablet Take 10 mg by mouth daily.     Marland Kitchen aspirin EC 81 MG tablet Take 81 mg by mouth daily.    Marland Kitchen atorvastatin (LIPITOR) 20 MG tablet Take 40 mg  by mouth daily.     . B Complex-C (B-COMPLEX WITH VITAMIN C) tablet Take 1 tablet by mouth daily.    . Biotin 5 MG CAPS Take 1 capsule by mouth daily.    . Biotin w/ Vitamins C & E (HAIR/SKIN/NAILS PO) Take 1 tablet by mouth daily.    . DULoxetine (CYMBALTA) 30 MG capsule Take 1 capsule by mouth daily.    . fluticasone (FLONASE) 50 MCG/ACT nasal spray Place 2 sprays into both nostrils as needed for allergies.     . furosemide (LASIX) 20 MG tablet Take 20-40 mg by mouth 2 (two) times daily. Take 40 mg in the morning and 20 mg in the evening    . hydrochlorothiazide (MICROZIDE) 12.5 MG capsule Take 1 capsule (12.5 mg total) by mouth daily. 10 capsule 0  . HYDROcodone-acetaminophen (NORCO) 10-325 MG tablet Take 1 tablet by mouth every 4 (four) hours.    Marland Kitchen lisinopril (PRINIVIL,ZESTRIL) 40 MG tablet Take 40 mg by  mouth daily.    . Magnesium 250 MG TABS Take 1 tablet by mouth daily.    . Multiple Vitamins-Minerals (PRESERVISION AREDS 2 PO) Take 2 tablets by mouth daily.     . Potassium Citrate 99 MG CAPS Take 1 capsule by mouth daily.    Marland Kitchen pyridOXINE (VITAMIN B-6) 100 MG tablet Take 100 mg by mouth daily.    Marland Kitchen terbinafine (LAMISIL) 250 MG tablet Take 250 mg by mouth daily.    . traZODone (DESYREL) 150 MG tablet Take 150 mg by mouth at bedtime.    . vitamin B-12 (CYANOCOBALAMIN) 1000 MCG tablet Take 2,500 mcg by mouth daily.    . Vitamins-Lipotropics (EAR HEALTH PLUS PO) Take 1 tablet by mouth daily.     No current facility-administered medications for this visit.     Allergies  Allergen Reactions  . Bee Venom Anaphylaxis    Past Medical History:  Diagnosis Date  . Anginal pain ([place])   . Atypical chest pain   . Depression   . Hyperlipidemia   . Hypertension   . Kidney tumor   . Obesity     Blood pressure 136/60, pulse 64, resp. rate 15, height 5\' 8"  (1.727 m), weight 194 lb 11.2 oz (88.3 kg), SpO2 94 %.  Hypertension Patient with essential hypertension, currently well controlled.  The increased dose in furosemide has decreased her edema, and I asked that she cut the afternoon dose to every other day then every 3rd, in hopes of getting her back to just the 40 mg qd.  She is to continue with home BP checks 3-4 times per week and was given instructions on proper technique.  She should call should the edema return or her home BP readings increase to consistently > 130/80.   It was also stressed that she need to pick up the prescribed antibiotic and stay well hydrated.     [name] PharmD CPP [facility] [address] [place], [place] [address] [phone]

## 2019-05-22 NOTE — Patient Instructions (Addendum)
   Your blood pressure today is 136/60  Check your blood pressure at home 3-4 days per week and keep record of the readings.  Take your BP meds as follows:  Cut furosemide dose back to 2 tablets each morning.  Take 1 tablet in the afternoon every other day for 2-3 doses then every 3 days.  If you notice no change in ankle swelling, then stop the afternoon doses.     Continue all other medications  Bring all of your meds, your BP cuff and your record of home blood pressures to your next appointment.  Exercise as you're able, try to walk approximately 30 minutes per day.  Keep salt intake to a minimum, especially watch canned and prepared boxed foods.  Eat more fresh fruits and vegetables and fewer canned items.  Avoid eating in fast food restaurants.    HOW TO TAKE YOUR BLOOD PRESSURE: . Rest 5 minutes before taking your blood pressure. .  Don't smoke or drink caffeinated beverages for at least 30 minutes before. . Take your blood pressure before (not after) you eat. . Sit comfortably with your back supported and both feet on the floor (don't cross your legs). . Elevate your arm to heart level on a table or a desk. . Use the proper sized cuff. It should fit smoothly and snugly around your bare upper arm. There should be enough room to slip a fingertip under the cuff. The bottom edge of the cuff should be 1 inch above the crease of the elbow. . Ideally, take 3 measurements at one sitting and record the average.

## 2019-05-22 NOTE — Assessment & Plan Note (Addendum)
Patient with essential hypertension, currently well controlled.  The increased dose in furosemide has decreased her edema, and I asked that she cut the afternoon dose to every other day then every 3rd, in hopes of getting her back to just the 40 mg qd.  She is to continue with home BP checks 3-4 times per week and was given instructions on proper technique.  She should call should the edema return or her home BP readings increase to consistently > 130/80.   It was also stressed that she need to pick up the prescribed antibiotic and stay well hydrated.

## 2019-05-22 NOTE — ED Notes (Signed)
Pt ambulated to RR wo complaints

## 2019-05-22 NOTE — ED Triage Notes (Signed)
Pt in POV, reports R flank pain since Sunday, was seen at PCP for same and given a "shot" but has seen no relief in pain. Appears to be in discomfort in triage.

## 2019-05-23 LAB — URINE CULTURE: Culture: NO GROWTH

## 2019-06-05 DIAGNOSIS — N3281 Overactive bladder: Secondary | ICD-10-CM | POA: Diagnosis not present

## 2019-06-10 DIAGNOSIS — Z800 Family history of malignant neoplasm of digestive organs: Secondary | ICD-10-CM | POA: Diagnosis not present

## 2019-06-10 DIAGNOSIS — Z1211 Encounter for screening for malignant neoplasm of colon: Secondary | ICD-10-CM | POA: Diagnosis not present

## 2019-06-10 DIAGNOSIS — K5730 Diverticulosis of large intestine without perforation or abscess without bleeding: Secondary | ICD-10-CM | POA: Diagnosis not present

## 2019-06-10 DIAGNOSIS — Z86010 Personal history of colonic polyps: Secondary | ICD-10-CM | POA: Diagnosis not present

## 2019-06-24 DIAGNOSIS — M1991 Primary osteoarthritis, unspecified site: Secondary | ICD-10-CM | POA: Diagnosis not present

## 2019-06-24 DIAGNOSIS — I10 Essential (primary) hypertension: Secondary | ICD-10-CM | POA: Diagnosis not present

## 2019-06-24 DIAGNOSIS — E748 Other specified disorders of carbohydrate metabolism: Secondary | ICD-10-CM | POA: Diagnosis not present

## 2019-06-24 DIAGNOSIS — R0602 Shortness of breath: Secondary | ICD-10-CM | POA: Diagnosis not present

## 2019-06-24 DIAGNOSIS — G894 Chronic pain syndrome: Secondary | ICD-10-CM | POA: Diagnosis not present

## 2019-06-24 DIAGNOSIS — R5383 Other fatigue: Secondary | ICD-10-CM | POA: Diagnosis not present

## 2019-07-02 ENCOUNTER — Telehealth: Payer: Self-pay

## 2019-07-02 NOTE — Telephone Encounter (Signed)
Left message for patient to call the office and schedule appointment.

## 2019-07-18 DIAGNOSIS — N3281 Overactive bladder: Secondary | ICD-10-CM | POA: Diagnosis not present

## 2019-07-24 DIAGNOSIS — G894 Chronic pain syndrome: Secondary | ICD-10-CM | POA: Diagnosis not present

## 2019-08-26 DIAGNOSIS — J309 Allergic rhinitis, unspecified: Secondary | ICD-10-CM | POA: Diagnosis not present

## 2019-08-26 DIAGNOSIS — E119 Type 2 diabetes mellitus without complications: Secondary | ICD-10-CM | POA: Diagnosis not present

## 2019-08-26 DIAGNOSIS — I10 Essential (primary) hypertension: Secondary | ICD-10-CM | POA: Diagnosis not present

## 2019-08-26 DIAGNOSIS — M1991 Primary osteoarthritis, unspecified site: Secondary | ICD-10-CM | POA: Diagnosis not present

## 2019-08-26 DIAGNOSIS — M75101 Unspecified rotator cuff tear or rupture of right shoulder, not specified as traumatic: Secondary | ICD-10-CM | POA: Diagnosis not present

## 2019-08-26 DIAGNOSIS — G894 Chronic pain syndrome: Secondary | ICD-10-CM | POA: Diagnosis not present

## 2019-09-11 ENCOUNTER — Ambulatory Visit: Payer: Medicare Other | Admitting: Cardiovascular Disease

## 2019-09-18 DIAGNOSIS — E119 Type 2 diabetes mellitus without complications: Secondary | ICD-10-CM | POA: Diagnosis not present

## 2019-09-24 ENCOUNTER — Other Ambulatory Visit: Payer: Self-pay

## 2019-09-24 ENCOUNTER — Other Ambulatory Visit (HOSPITAL_COMMUNITY): Payer: Self-pay | Admitting: Internal Medicine

## 2019-09-24 ENCOUNTER — Ambulatory Visit (HOSPITAL_COMMUNITY)
Admission: RE | Admit: 2019-09-24 | Discharge: 2019-09-24 | Disposition: A | Discharge status: Home / Self Care | Payer: Medicare Other | Source: Ambulatory Visit | Attending: Internal Medicine | Admitting: Internal Medicine

## 2019-09-24 DIAGNOSIS — M25552 Pain in left hip: Secondary | ICD-10-CM

## 2019-09-24 DIAGNOSIS — G894 Chronic pain syndrome: Secondary | ICD-10-CM | POA: Diagnosis not present

## 2019-09-24 DIAGNOSIS — S79912A Unspecified injury of left hip, initial encounter: Secondary | ICD-10-CM | POA: Diagnosis not present

## 2019-09-24 DIAGNOSIS — R58 Hemorrhage, not elsewhere classified: Secondary | ICD-10-CM | POA: Diagnosis not present

## 2019-09-24 DIAGNOSIS — E1165 Type 2 diabetes mellitus with hyperglycemia: Secondary | ICD-10-CM | POA: Diagnosis not present

## 2019-09-24 DIAGNOSIS — M1991 Primary osteoarthritis, unspecified site: Secondary | ICD-10-CM | POA: Diagnosis not present

## 2019-09-25 ENCOUNTER — Other Ambulatory Visit: Payer: Self-pay | Admitting: Internal Medicine

## 2019-09-25 DIAGNOSIS — M859 Disorder of bone density and structure, unspecified: Secondary | ICD-10-CM

## 2019-10-01 ENCOUNTER — Ambulatory Visit (HOSPITAL_COMMUNITY)
Admission: RE | Admit: 2019-10-01 | Discharge: 2019-10-01 | Disposition: A | Discharge status: Home / Self Care | Payer: Medicare Other | Source: Ambulatory Visit | Attending: Internal Medicine | Admitting: Internal Medicine

## 2019-10-01 ENCOUNTER — Other Ambulatory Visit: Payer: Self-pay

## 2019-10-01 DIAGNOSIS — R2242 Localized swelling, mass and lump, left lower limb: Secondary | ICD-10-CM | POA: Diagnosis not present

## 2019-10-01 DIAGNOSIS — M859 Disorder of bone density and structure, unspecified: Secondary | ICD-10-CM | POA: Insufficient documentation

## 2019-10-01 LAB — POCT I-STAT CREATININE: Creatinine, Ser: 0.9 mg/dL (ref 0.44–1.00)

## 2019-10-01 MED ORDER — GADOBUTROL 1 MMOL/ML IV SOLN
7.0000 mL | Freq: Once | INTRAVENOUS | Status: AC | PRN
  Administered 2019-10-01: 7 mL via INTRAVENOUS

## 2019-10-02 ENCOUNTER — Other Ambulatory Visit: Payer: Self-pay | Admitting: *Deleted

## 2019-10-02 NOTE — Patient Outreach (Addendum)
[facility]) Care Management  [place]  10/02/2019   [name] [birth year] [id number]  RN Health Coach telephone call to patient.  Hipaa compliance verified. Per patient she had a recent fall. Patient does not have a medical alert system. Husband was there with patient. Patient has had MRI and test being done. Patient is having a lot of hip pain, per patient it is bursitus and she is on hydrocodone. Patient is a diabetic but it is under control and not on any medications at this time. Patient has hypertension and noted a reading in may of 202/97. Patient has agreed to further outreach calls.   Encounter Medications:  Outpatient Encounter Medications as of 10/02/2019  Medication Sig  . acyclovir (ZOVIRAX) 400 MG tablet Take 400 mg by mouth 2 (two) times daily.   Marland Kitchen amLODipine (NORVASC) 10 MG tablet Take 10 mg by mouth daily.   Marland Kitchen aspirin EC 81 MG tablet Take 81 mg by mouth daily.  Marland Kitchen atorvastatin (LIPITOR) 20 MG tablet Take 40 mg by mouth daily.   . B Complex-C (B-COMPLEX WITH VITAMIN C) tablet Take 1 tablet by mouth daily.  . Biotin 5 MG CAPS Take 1 capsule by mouth daily.  . Biotin w/ Vitamins C & E (HAIR/SKIN/NAILS PO) Take 1 tablet by mouth daily.  . DULoxetine (CYMBALTA) 30 MG capsule Take 1 capsule by mouth daily.  . fluticasone (FLONASE) 50 MCG/ACT nasal spray Place 2 sprays into both nostrils as needed for allergies.   . furosemide (LASIX) 20 MG tablet Take 20-40 mg by mouth 2 (two) times daily. Take 40 mg in the morning and 20 mg in the evening  . hydrochlorothiazide (MICROZIDE) 12.5 MG capsule Take 1 capsule (12.5 mg total) by mouth daily.  Marland Kitchen HYDROcodone-acetaminophen (NORCO) 10-325 MG tablet Take 1 tablet by mouth every 4 (four) hours.  Marland Kitchen lisinopril (PRINIVIL,ZESTRIL) 40 MG tablet Take 40 mg by mouth daily.  . Magnesium 250 MG TABS Take 1 tablet by mouth daily.  . Multiple Vitamins-Minerals (PRESERVISION AREDS 2 PO) Take 2 tablets by mouth daily.   .  Potassium Citrate 99 MG CAPS Take 1 capsule by mouth daily.  Marland Kitchen pyridOXINE (VITAMIN B-6) 100 MG tablet Take 100 mg by mouth daily.  Marland Kitchen terbinafine (LAMISIL) 250 MG tablet Take 250 mg by mouth daily.  . traZODone (DESYREL) 150 MG tablet Take 150 mg by mouth at bedtime.  . vitamin B-12 (CYANOCOBALAMIN) 1000 MCG tablet Take 2,500 mcg by mouth daily.  . Vitamins-Lipotropics (EAR HEALTH PLUS PO) Take 1 tablet by mouth daily.   No facility-administered encounter medications on file as of 10/02/2019.     Functional Status:  In your present state of health, do you have any difficulty performing the following activities: 10/02/2019  Hearing? N  Vision? N  Difficulty concentrating or making decisions? N  Walking or climbing stairs? Y  Comment patient uses  cane/ has bursitus in hip  Dressing or bathing? N  Doing errands, shopping? Y  Comment husband assists  Conservation officer, nature and eating ? N  In the past six months, have you accidently leaked urine? Y  Do you have problems with loss of bowel control? N  Managing your Medications? N  Managing your Finances? N  Housekeeping or managing your Housekeeping? N  Some recent data might be hidden    Fall/Depression Screening: Fall Risk  10/02/2019 07/27/2017 03/05/2014  Falls in the past year? 1 Yes Yes  Number falls in past yr: 0 1  1  Injury with Fall? 1 No Yes  Risk for fall due to : History of fall(s);Impaired balance/gait;Impaired mobility - History of fall(s);Impaired mobility  Follow up Falls evaluation completed;Education provided;Falls prevention discussed - -   PHQ 2/9 Scores 10/02/2019 03/05/2014  PHQ - 2 Score 1 2   THN CM Care Plan Problem One     Most Recent Value  Care Plan Problem One  Knowledge Deficit in self management of Hypertension  Role Documenting the Problem One  [place] for Problem One  Active  THN Long Term Goal   Patient will not have a blood pressure over 140/80 within the next 90 days  THN Long Term Goal Start  Date  10/02/19  Interventions for Problem One Long Term Goal  RN discussed hypertension.RN discussed low sodium diet. RN sent educational material on a Matter of Choice blood pressure control. RN will follow up with further discussion  THN CM Short Term Goal #1   Patient will check blood pressure and document within the next 30 days  THN CM Short Term Goal #1 Start Date  10/02/19  Interventions for Short Term Goal #1  RN discussed monitoring blood pressure. RN sent educational information on How to check your blood pressure. RN sent a binder for patient to document and leep records  [facility] CM Short Term Goal #2   Patient will have a better understanding of foods high and low in sodium,. RN sent a picture sheet of foods high and low in sodium. RN will follow up with further discussion       Assessment:  Patient is having moderate hip pain Patient is currently not exercising at this time.  Patient had recent fall Patient does not have a medical alert system Patient needs assistance with medication Patient will benefit from [place] telephonic outreach for education and support for hypertension self management.  Plan:  RN discussed fall prevention RN sent [facility] welcome packet RN discussed hypertension RN sent sheet on How to read blood pressure RN sent A matter of choice blood pressure control booklet RN discussed checking and documenting blood pressure RN sent a 2021 Calendar book for documentation Referral to Pharmacy RN sent barriers letter and assessment to PCP RN will follow up outreach within the month of March  [name] [place] Management [phone]

## 2019-10-03 ENCOUNTER — Telehealth: Payer: Self-pay | Admitting: Pharmacist

## 2019-10-03 NOTE — Patient Outreach (Signed)
[facility]) Care Management  [place]   10/03/2019  [name] 1945-11-12 [id number]  Reason for referral: medication assistance  Referral source: [facility] Healthcoach Referral medication(s): Myrbetriq Current insurance:  [place] [employer]  HPI:  Patient was called regarding medication assistance. HIPAA identifiers were obtained. Patient is a 73 year old female with multiple medical conditions including but not limited to:  Type 2 Diabetes, hyperlipidemia, hypertension, and generalized anxiety disorder.  Patient expressed interest in patient assistance for Myrbetriq.  Objective: Allergies  Allergen Reactions  . Bee Venom Anaphylaxis    Medications Reviewed Today    Reviewed by Elayne Guerin, [facility] (Pharmacist) on 10/03/19 at 1315  Med List Status: <None>  Medication Order Taking? Sig Documenting Provider Last Dose Status Informant  acyclovir (ZOVIRAX) 400 MG tablet [id number]  Take 400 mg by mouth 2 (two) times daily.  [provider]  Active Multiple Informants  amLODipine (NORVASC) 10 MG tablet [id number] Yes Take 10 mg by mouth daily.  [provider] Taking Active Multiple Informants  aspirin EC 81 MG tablet [id number] Yes Take 81 mg by mouth daily. [provider] Taking Active Multiple Informants  atorvastatin (LIPITOR) 40 MG tablet [id number] Yes Take 40 mg by mouth daily. [provider] Taking Active   B Complex-C (B-COMPLEX WITH VITAMIN C) tablet [id number] Yes Take 1 tablet by mouth daily. [provider] Taking Active Multiple Informants  Biotin 5 MG CAPS [id number] Yes Take 1 capsule by mouth daily. [provider] Taking Active Multiple Informants        Discontinued 10/03/19 1313 (Completed Course)   DULoxetine (CYMBALTA) 30 MG capsule [id number] Yes Take 1 capsule by mouth daily. [provider] Taking Active Multiple Informants  EPINEPHrine 0.3 mg/0.3 mL IJ SOAJ injection [id number] Yes  Use as needed [provider] Taking Active   fluticasone (FLONASE) 50 MCG/ACT nasal spray [id number] Yes Place 2 sprays into both nostrils as needed for allergies.  [provider] Taking Active Multiple Informants  furosemide (LASIX) 20 MG tablet [id number] Yes Take 20-40 mg by mouth 2 (two) times daily. Take 40 mg in the morning and 20 mg in the evening [provider] Taking Active Multiple Informants           Med Note Regino Bellow Oct 03, 2019  1:13 PM) Takes every other day   hydrochlorothiazide (MICROZIDE) 12.5 MG capsule [id number] Yes Take 1 capsule (12.5 mg total) by mouth daily. Lorretta Harp, MD Taking Active Multiple Informants  HYDROcodone-acetaminophen [facility]) 10-325 MG tablet [id number] Yes Take 1 tablet by mouth every 4 (four) hours. [provider] Taking Active Multiple Informants  lisinopril (PRINIVIL,ZESTRIL) 40 MG tablet [id number] Yes Take 40 mg by mouth daily. [provider] Taking Active Multiple Informants  Magnesium 250 MG TABS [id number] Yes Take 1 tablet by mouth daily. [provider] Taking Active Multiple Informants  Multiple Vitamins-Minerals (PRESERVISION AREDS 2 PO) [id number] Yes Take 2 tablets by mouth daily.  [provider] Taking Active Multiple Informants  MYRBETRIQ 50 MG TB24 tablet [id number] Yes Take 50 mg by mouth daily. [provider] Taking Active   Potassium Citrate 99 MG CAPS [id number] Yes Take 1 capsule by mouth daily. [provider] Taking Active Multiple Informants  pyridOXINE (VITAMIN B-6) 100 MG tablet [id number] Yes Take 100 mg by mouth daily. [provider] Taking Active Multiple Informants        Discontinued 10/03/19 1314 (Completed Course)  traZODone (DESYREL) 150 MG tablet [id number] Yes Take 150 mg by mouth at bedtime. [provider] Taking Active Multiple Informants           Med Note Romilda Garret Nov 11, 2015  5:02 PM)      vitamin B-12 (CYANOCOBALAMIN) 1000 MCG tablet [id number] Yes Take 2,500 mcg by mouth daily. [provider] Taking Active Multiple Informants  Vitamins-Lipotropics (EAR HEALTH PLUS PO) [id number] Yes Take 1 tablet by mouth daily. [provider] Taking Active Multiple Informants          Assessment:  Drugs sorted by system:  Neurologic/Psychologic: Duloxetine, Trazodone  Cardiovascular: Amlodipine, Atorvastatin, Aspirin, Furosemide, Hydrochlorothiazide,    Pulmonary/Allergy: Epinephrine Injection, Fluticasone,   Infectious Disease: Acyclovir  Pain: Hydrocodone/Acetaminophen,   Vitamins/Minerals/Supplements: B Complex, Biotin, Potassium, Pyridoxine, Cyanocobalamin, Ear Health Vitamins  Miscellaneous: Myrbetriq  Medication Assistance Findings:  Medication assistance needs identified: Myrbetriq.    Unfortunately, [place] Patient Assistance Program does not allow patient's with Medicare to apply to their program.  Patient said she has not tried any other therapies. A discussion was had about potentially having her provider submit a tier exception form. However, she would need to try a traditional therapy.   Patient was in the car and said she did not have time to call her insurance on conference call.   Additional medication assistance options reviewed with patient as warranted:  No other options identified  Plan: Await a call back from the patient. Call patient back in 3-4 weeks.  Elayne Guerin, PharmD, [place] Clinical Pharmacist [phone]

## 2019-10-10 DIAGNOSIS — M7551 Bursitis of right shoulder: Secondary | ICD-10-CM | POA: Diagnosis not present

## 2019-10-10 DIAGNOSIS — M7541 Impingement syndrome of right shoulder: Secondary | ICD-10-CM | POA: Diagnosis not present

## 2019-10-14 DIAGNOSIS — S39012A Strain of muscle, fascia and tendon of lower back, initial encounter: Secondary | ICD-10-CM | POA: Diagnosis not present

## 2019-10-14 DIAGNOSIS — M25552 Pain in left hip: Secondary | ICD-10-CM | POA: Diagnosis not present

## 2019-10-14 DIAGNOSIS — M7062 Trochanteric bursitis, left hip: Secondary | ICD-10-CM | POA: Diagnosis not present

## 2019-10-17 DIAGNOSIS — I10 Essential (primary) hypertension: Secondary | ICD-10-CM | POA: Diagnosis not present

## 2019-10-17 DIAGNOSIS — G894 Chronic pain syndrome: Secondary | ICD-10-CM | POA: Diagnosis not present

## 2019-10-23 NOTE — Progress Notes (Signed)
Cardiology Clinic Note   Patient Name: [name] Date of Encounter: 10/24/2019  Primary Care Provider:  Redmond School, MD Primary Cardiologist:  Quay Burow, MD  Patient Profile    [name] 73 year old female presents today for follow-up of her hypertension, atypical chest pain, and hyperlipidemia.  Past Medical History    Past Medical History:  Diagnosis Date  . Anginal pain ([place])   . Atypical chest pain   . Depression   . Hyperlipidemia   . Hypertension   . Kidney tumor   . Obesity    Past Surgical History:  Procedure Laterality Date  . ABDOMINAL HYSTERECTOMY    . CARDIAC CATHETERIZATION  2009   normal coronary arteries, preserved LVEF 60%  . CHOLECYSTECTOMY  2006   Dr. Hulen Skains.  . COLONOSCOPY  2004   Dr. Collene Mares: hemorrhoids, diverticulosis, some stool present and small lesions could have been missed. repeat colonoscopy in five years.   . COLONOSCOPY  2016   Dr. Collene Mares: unremarkable per patient  . EMBOLIZATION    . HERNIA REPAIR    . TONSILLECTOMY      Allergies  Allergies  Allergen Reactions  . Bee Venom Anaphylaxis    History of Present Illness  [name] has a past medical history of chest pain, hyperlipidemia, hypertension, cardiac catheterization x2 remotely by Dr. Dennison Bulla that showed normal coronaries.  She has a history of anxiety with panic attacks.  Her Myoview stress test from 5/17 was negative.   She was last seen by Dr. Gwenlyn Found on 03/15/2019.  During that time she was doing reasonably well.  She had fallen down and broken several ribs, she was describing some positional chest pain related to her ribs.  She also had some gastrointestinal issues which were evaluated in [place] by Dr. Collene Mares.  She denied shortness of breath.  Her blood pressure was elevated at that time.  200/80 she was started on HCTZ 12.5 and sent to the hypertension clinic for further evaluation.  On 05/22/2019 blood pressure was 133/52.  She presents to the office today and  states she is using pain due to a fall in her yard yesterday.  She states she continues to be very physically active managing her 12 acre property.  She routinely push mows, relates, clears branches.  She has been monitoring her blood pressure at home and gets been 130s over 70s.  It is slightly today however, she is having severe pain and is tearful in the office.  She also notices chest discomfort but states that she lifted several heavy branches yesterday while clearing her property.  She denies  shortness of breath, lower extremity edema, fatigue, palpitations, melena, hematuria, hemoptysis, diaphoresis, weakness, presyncope, syncope, orthopnea, and PND.   Home Medications    Prior to Admission medications   Medication Sig Start Date End Date Taking? Authorizing Provider  acyclovir (ZOVIRAX) 400 MG tablet Take 400 mg by mouth 2 (two) times daily.     [provider]  amLODipine (NORVASC) 10 MG tablet Take 10 mg by mouth daily.     [provider]  aspirin EC 81 MG tablet Take 81 mg by mouth daily.    [provider]  atorvastatin (LIPITOR) 40 MG tablet Take 40 mg by mouth daily. 08/15/19   [provider]  B Complex-C (B-COMPLEX WITH VITAMIN C) tablet Take 1 tablet by mouth daily.    [provider]  Biotin 5 MG CAPS Take 1 capsule by mouth daily.  [provider]  DULoxetine (CYMBALTA) 30 MG capsule Take 1 capsule by mouth daily.    [provider]  EPINEPHrine 0.3 mg/0.3 mL IJ SOAJ injection Use as needed    [provider]  fluticasone (FLONASE) 50 MCG/ACT nasal spray Place 2 sprays into both nostrils as needed for allergies.     [provider]  furosemide (LASIX) 20 MG tablet Take 20-40 mg by mouth 2 (two) times daily. Take 40 mg in the morning and 20 mg in the evening    [provider]  hydrochlorothiazide (MICROZIDE) 12.5 MG capsule Take 1 capsule (12.5 mg total) by mouth daily. 03/15/19   Lorretta Harp, MD  HYDROcodone-acetaminophen (NORCO) 10-325 MG tablet Take 1 tablet by mouth every 4 (four) hours.    [provider]  lisinopril (PRINIVIL,ZESTRIL) 40 MG tablet Take 40 mg by mouth daily.    [provider]  Magnesium 250 MG TABS Take 1 tablet by mouth daily.    [provider]  Multiple Vitamins-Minerals (PRESERVISION AREDS 2 PO) Take 2 tablets by mouth daily.     [provider]  MYRBETRIQ 50 MG TB24 tablet Take 50 mg by mouth daily. 09/13/19   [provider]  Potassium Citrate 99 MG CAPS Take 1 capsule by mouth daily.    [provider]  pyridOXINE (VITAMIN B-6) 100 MG tablet Take 100 mg by mouth daily.    [provider]  traZODone (DESYREL) 150 MG tablet Take 150 mg by mouth at bedtime. 10/30/15   [provider]  vitamin B-12 (CYANOCOBALAMIN) 1000 MCG tablet Take 2,500 mcg by mouth daily.    [provider]  Vitamins-Lipotropics (EAR HEALTH PLUS PO) Take 1 tablet by mouth daily.    [provider]    Family History    Family History  Problem Relation Age of Onset  . Diabetes Mother   . Hypertension Mother   . Colon cancer Brother        deceased age 3  . Stroke Father   . Cancer Sister        breast  . Breast cancer Sister   . Other Son        chron's   . Breast cancer Maternal Grandmother    She indicated that her mother is deceased. She indicated that her father is deceased. She indicated that both of her sisters are alive. She indicated that her brother is deceased. She indicated that the status of her maternal grandmother is unknown. She indicated that both of her sons are alive. She indicated that her other is alive.  Social History    Social History   Socioeconomic History  . Marital status: Married    Spouse name: Not on file  . Number of children: Not on file  . Years of education: Not on file  . Highest education level: Not on file  Occupational History  .  Not on file  Tobacco Use  . Smoking status: Never Smoker  . Smokeless tobacco: Never Used  Substance and Sexual Activity  . Alcohol use: No  . Drug use: No  . Sexual activity: Yes    Birth control/protection: Post-menopausal, Surgical  Other Topics Concern  . 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    Social Determinants of Health   Financial Resource Strain:   . Difficulty of Paying Living Expenses: Not on file  Food Insecurity:   . Worried About Charity fundraiser in the Last Year: Not on file  . Ran Out of Food in the Last Year: Not on file  Transportation Needs:   . Lack of Transportation (Medical): Not on file  . Lack of Transportation (Non-Medical): Not on file  Physical Activity:   . Days of Exercise per Week: Not on file  . Minutes of Exercise per Session: Not on file  Stress:   . Feeling of Stress : Not on file  Social Connections:   . Frequency of Communication with Friends and Family: Not on file  . Frequency of Social Gatherings with Friends and Family: Not on file  . Attends Religious Services: Not on file  . Active Member of Clubs or Organizations: Not on file  . Attends Archivist Meetings: Not on file  . Marital Status: Not on file  Intimate Partner Violence:   . Fear of Current or Ex-Partner: Not on file  . Emotionally Abused: Not on file  . Physically Abused: Not on file  . Sexually Abused: Not on file     Review of Systems    General:  No chills, fever, night sweats or weight changes.  Cardiovascular:  No chest pain, dyspnea on exertion, edema, orthopnea, palpitations, paroxysmal nocturnal dyspnea. Dermatological: No rash, lesions/masses Respiratory: No cough, dyspnea Urologic: No hematuria, dysuria Abdominal:   No nausea, vomiting, diarrhea, bright red blood  per rectum, melena, or hematemesis Neurologic:  No visual changes, wkns, changes in mental status. All other systems reviewed and are otherwise negative except as noted above.  Physical Exam    VS:  BP (!) 155/78 (BP Location: Left Arm, Patient Position: Sitting, Cuff Size: Normal)   Pulse 82   Ht 5' 8"  (1.727 m)   Wt 203 lb 3.2 oz (92.2 kg)   BMI 30.90 kg/m  , BMI Body mass index is 30.9 kg/m. GEN: Well nourished, well developed, in no acute distress. HEENT: normal. Neck: Supple, no JVD, carotid bruits, or masses. Cardiac: RRR, no murmurs, rubs, or gallops. No clubbing, cyanosis, edema.  Chest wall tenderness radials/DP/PT 2+ and equal bilaterally.  Respiratory:  Respirations regular and unlabored, clear to auscultation bilaterally. GI: Soft, nontender, nondistended, BS + x 4. MS: no deformity or atrophy. Skin: warm and dry, no rash. Neuro:  Strength and sensation are intact. Psych: Normal affect.  Accessory Clinical Findings    ECG personally reviewed by me today-normal sinus rhythm 84 bpm- No acute changes  EKG 05/09/2018 Sinus rhythm borderline repolarization abnormality prolonged QT interval 86 bpm  Stress test/myocardial perfusion study 03/16/2016  The left ventricular ejection fraction is mildly decreased (45-54%).  Nuclear stress EF: 48%. No wall motion abnormalities  There was no ST segment deviation noted during stress. PVCs noted in recovery, bigeminy pattern, frequent  Defect 1: There is a small defect of mild severity present in the apex location. No ischemia  This is a low risk study. No ischemia identified.  Assessment & Plan   1.  Essential hypertension-BP today 155/78.  Better controlled at home 130's over 70's. Left knee pain today from falling in her yard yesterday. Following up with orthopedics today. Continue hydrochlorothiazide 12.5 mg daily Continue amlodipine 10 mg tablet daily Continue aspirin 81 mg tablet daily Continue furosemide 20-40 mg  twice daily Continue lisinopril  40 mg tablet daily Heart healthy low-sodium diet-salty 6 given Increase physical activity as tolerated Order BMP BP log given   Hyperlipidemia-LDL 92 09/20/2018 Continue atorvastatin 40 mg tablet daily Heart healthy low-sodium high-fiber diet Increase physical activity as tolerated Order lipid profile  Atypical chest pain/normal coronary arteries- muscular wall  pain.  Yesterday she completed significant manual labor lifting heavy branches on he [place] acre property.  Discomfort reproduced with palpation.  She has had a history of multiple normal cardiac catheterizations performed by Dr. Erskine Emery.  Her Myoview stress test 02/2016 was also negative. Continue to monitor.  Disposition: Follow-up with Dr. Gwenlyn Found in 6 months.  Jossie Ng. [facility] [place] [address] Office [phone] Fax [phone]

## 2019-10-24 ENCOUNTER — Other Ambulatory Visit: Payer: Self-pay

## 2019-10-24 ENCOUNTER — Ambulatory Visit (INDEPENDENT_AMBULATORY_CARE_PROVIDER_SITE_OTHER): Payer: Medicare Other | Admitting: General Practice

## 2019-10-24 ENCOUNTER — Encounter: Payer: Self-pay | Admitting: General Practice

## 2019-10-24 VITALS — BP 155/78 | HR 82 | Ht 68.0 in | Wt 203.2 lb

## 2019-10-24 DIAGNOSIS — I10 Essential (primary) hypertension: Secondary | ICD-10-CM | POA: Diagnosis not present

## 2019-10-24 DIAGNOSIS — R0789 Other chest pain: Secondary | ICD-10-CM

## 2019-10-24 DIAGNOSIS — M238X2 Other internal derangements of left knee: Secondary | ICD-10-CM | POA: Diagnosis not present

## 2019-10-24 DIAGNOSIS — Z79899 Other long term (current) drug therapy: Secondary | ICD-10-CM | POA: Diagnosis not present

## 2019-10-24 DIAGNOSIS — E7800 Pure hypercholesterolemia, unspecified: Secondary | ICD-10-CM

## 2019-10-24 DIAGNOSIS — M25562 Pain in left knee: Secondary | ICD-10-CM | POA: Diagnosis not present

## 2019-10-24 NOTE — Patient Instructions (Signed)
Labwork: CMET AND DIRECT LDL TODAY HERE IN OUR OFFICE AT [facility]   If you have labs (blood work) drawn today and your tests are completely normal, you will receive your results only by: Marland Kitchen MyChart Message (if you have MyChart) OR . A paper copy in the mail If you have any lab test that is abnormal or we need to change your treatment, we will call you to review the results.  Special Instructions: TAKE AND LOG YOU BLOOD PRESSURE AT DIFFERENT TIMES DURING THE DAY  Reduce your risk of getting COVID-19 With your heart disease it is especially important for people at increased risk of severe illness from COVID-19, and those who live with them, to protect themselves from getting COVID-19. The best way to protect yourself and to help reduce the spread of the virus that causes COVID-19 is to: Marland Kitchen Limit your interactions with other people as much as possible. . Take precautions to prevent getting COVID-19 when you do interact with others. If you start feeling sick and think you may have COVID-19, get in touch with your healthcare provider within 24 hours.  Follow-Up: IN 6 months Please call our office 2 months in advance, APR 2021 to schedule this JUN 2021 appointment. In Person You may see Quay Burow, MD or one of the following Advanced Practice Providers on your designated Care Team:  Coletta Memos, [place], PA-C  [place], [place].    At [facility], you and your health needs are our priority.  As part of our continuing mission to provide you with exceptional heart care, we have created designated Provider Care Teams.  These Care Teams include your primary Cardiologist (physician) and Advanced Practice Providers (APPs -  Physician Assistants and Nurse Practitioners) who all work together to provide you with the care you need, when you need it.  Thank you for choosing [facility] at [facility]!!     Happy Holidays!!

## 2019-10-25 LAB — COMPREHENSIVE METABOLIC PANEL
ALT: 57 IU/L — ABNORMAL HIGH (ref 0–32)
AST: 51 IU/L — ABNORMAL HIGH (ref 0–40)
Albumin/Globulin Ratio: 2.3 — ABNORMAL HIGH (ref 1.2–2.2)
Albumin: 4.3 g/dL (ref 3.7–4.7)
Alkaline Phosphatase: 96 IU/L (ref 39–117)
BUN/Creatinine Ratio: 30 — ABNORMAL HIGH (ref 12–28)
BUN: 34 mg/dL — ABNORMAL HIGH (ref 8–27)
Bilirubin Total: 0.4 mg/dL (ref 0.0–1.2)
CO2: 24 mmol/L (ref 20–29)
Calcium: 9.6 mg/dL (ref 8.7–10.3)
Chloride: 102 mmol/L (ref 96–106)
Creatinine, Ser: 1.13 mg/dL — ABNORMAL HIGH (ref 0.57–1.00)
GFR calc Af Amer: 55 mL/min/{1.73_m2} — ABNORMAL LOW (ref >59)
GFR calc non Af Amer: 48 mL/min/{1.73_m2} — ABNORMAL LOW (ref >59)
Globulin, Total: 1.9 g/dL (ref 1.5–4.5)
Glucose: 232 mg/dL — ABNORMAL HIGH (ref 65–99)
Potassium: 4.3 mmol/L (ref 3.5–5.2)
Sodium: 140 mmol/L (ref 134–144)
Total Protein: 6.2 g/dL (ref 6.0–8.5)

## 2019-10-25 LAB — LDL CHOLESTEROL, DIRECT: LDL Direct: 89 mg/dL (ref 0–99)

## 2019-10-31 DIAGNOSIS — G894 Chronic pain syndrome: Secondary | ICD-10-CM | POA: Diagnosis not present

## 2019-10-31 DIAGNOSIS — I10 Essential (primary) hypertension: Secondary | ICD-10-CM | POA: Diagnosis not present

## 2019-10-31 DIAGNOSIS — M1991 Primary osteoarthritis, unspecified site: Secondary | ICD-10-CM | POA: Diagnosis not present

## 2019-11-11 ENCOUNTER — Other Ambulatory Visit: Payer: Self-pay | Admitting: Pharmacist

## 2019-11-11 NOTE — Patient Outreach (Signed)
[facility]) Care Management  11/11/2019  [name] 1946/03/18 [id number]   Patient was called to follow up on medication assistance with Myrbetriq. Her husband answered the phone and said she was out helping a friend and he would let her know I called.  Plan: Call patient back in 1-2 weeks.  Elayne Guerin, PharmD, [place] Clinical Pharmacist [phone]

## 2019-11-18 ENCOUNTER — Other Ambulatory Visit: Payer: Self-pay | Admitting: Pharmacist

## 2019-11-18 DIAGNOSIS — M25562 Pain in left knee: Secondary | ICD-10-CM | POA: Diagnosis not present

## 2019-11-18 DIAGNOSIS — M25552 Pain in left hip: Secondary | ICD-10-CM | POA: Diagnosis not present

## 2019-11-18 DIAGNOSIS — M25561 Pain in right knee: Secondary | ICD-10-CM | POA: Diagnosis not present

## 2019-11-18 DIAGNOSIS — M7062 Trochanteric bursitis, left hip: Secondary | ICD-10-CM | POA: Diagnosis not present

## 2019-11-18 DIAGNOSIS — M545 Low back pain: Secondary | ICD-10-CM | POA: Diagnosis not present

## 2019-11-18 NOTE — Patient Outreach (Signed)
[facility]) Care Management  11/18/2019  [name] 1946-05-16 [id number]   Patient was called to follow up on Myrbetriq patient assistance. HIPAA identifiers were obtained.   Myrbetriq is available through [employer]' Patient Assistance Program but they exclude patients who have Medicare. A discussion was had with the patient about speaking with her provider about completing a Tier Exception Form to be sent to her insurance.   Since there are no guarantee's the request would be granted, patient decided to continue to pay the requested copay for Myrbetriq.  Patient's medications were reviewed via telephone:  Medications Reviewed Today    Reviewed by Elayne Guerin, [facility] (Pharmacist) on 11/18/19 at 1344  Med List Status: <None>  Medication Order Taking? Sig Documenting Provider Last Dose Status Informant  acyclovir (ZOVIRAX) 400 MG tablet [id number] Yes Take 400 mg by mouth 2 (two) times daily.  [provider] Taking Active Multiple Informants  amLODipine (NORVASC) 10 MG tablet [id number] Yes Take 10 mg by mouth daily.  [provider] Taking Active Multiple Informants  aspirin EC 81 MG tablet [id number] Yes Take 81 mg by mouth daily. [provider] Taking Active Multiple Informants  atorvastatin (LIPITOR) 40 MG tablet [id number] Yes Take 40 mg by mouth daily. [provider] Taking Active   B Complex-C (B-COMPLEX WITH VITAMIN C) tablet [id number] Yes Take 1 tablet by mouth daily. [provider] Taking Active Multiple Informants  Biotin 5 MG CAPS [id number] Yes Take 1 capsule by mouth daily. [provider] Taking Active Multiple Informants  celecoxib (CELEBREX) 200 MG capsule [id number] Yes Take 1 capsule by mouth daily. [provider] Taking Active   DULoxetine (CYMBALTA) 30 MG capsule [id number] Yes Take 1 capsule by mouth daily. [provider] Taking Active Multiple Informants  EPINEPHrine 0.3 mg/0.3  mL IJ SOAJ injection [id number] Yes Use as needed [provider] Taking Active   fluticasone (FLONASE) 50 MCG/ACT nasal spray [id number] Yes Place 2 sprays into both nostrils as needed for allergies.  [provider] Taking Active Multiple Informants  furosemide (LASIX) 20 MG tablet [id number] Yes Take 20-40 mg by mouth 2 (two) times daily. Take 40 mg in the morning and 20 mg in the evening [provider] Taking Active Multiple Informants           Med Note Regino Bellow Oct 03, 2019  1:13 PM) Takes every other day   hydrochlorothiazide (MICROZIDE) 12.5 MG capsule [id number] Yes Take 1 capsule (12.5 mg total) by mouth daily. Lorretta Harp, MD Taking Active Multiple Informants  HYDROcodone-acetaminophen [facility]) 10-325 MG tablet [id number] Yes Take 1 tablet by mouth every 4 (four) hours. [provider] Taking Active Multiple Informants  lisinopril (PRINIVIL,ZESTRIL) 40 MG tablet [id number] Yes Take 40 mg by mouth daily. [provider] Taking Active Multiple Informants  Magnesium 250 MG TABS [id number] Yes Take 1 tablet by mouth daily. [provider] Taking Active Multiple Informants  methocarbamol (ROBAXIN) 500 MG tablet [id number] Yes Take 1 tablet by mouth every 8 (eight) hours as needed. [provider] Taking Active   Multiple Vitamins-Minerals (PRESERVISION AREDS 2 PO) [id number] Yes Take 2 tablets by mouth daily.  [provider] Taking Active Multiple Informants  MYRBETRIQ 50 MG TB24 tablet [id number] Yes Take 50 mg by mouth daily. [provider] Taking Active   polyethylene glycol powder (GLYCOLAX/MIRALAX) 17 GM/SCOOP powder [id number] Yes Use 17 g daily as needed. [provider] Taking Active  Potassium Citrate 99 MG CAPS [id number] Yes Take 1 capsule by mouth daily. [provider] Taking Active Multiple Informants           Med Note Corinna Lines Nov 18, 2019  1:44 PM) PRN   pyridOXINE (VITAMIN B-6) 100 MG tablet [id number] Yes Take 100 mg by mouth daily. [provider] Taking Active Multiple Informants  traMADol (ULTRAM) 50 MG tablet [id number] Yes TAKE 1 2 TABLETS BY MOUTH 4 TIMES A DAY AS NEEDED PAIN, MAY ADD TO HYDROCODONE IF NEEDED [provider] Taking Active   traZODone (DESYREL) 150 MG tablet [id number] Yes Take 150 mg by mouth at bedtime. [provider] Taking Active Multiple Informants           Med Note Romilda Garret Nov 11, 2015  5:02 PM)     vitamin B-12 (CYANOCOBALAMIN) 1000 MCG tablet [id number] Yes Take 2,500 mcg by mouth daily. [provider] Taking Active Multiple Informants  Vitamins-Lipotropics (EAR HEALTH PLUS PO) [id number] Yes Take 1 tablet by mouth daily. [provider] Taking Active Multiple Informants         Medication Findings:  Increased risk of CNS depression with combination of trazodone, tramadol, hydrocodone/APAP, duloxetine  Patient said she was just started on the Tramadol for a short period due to hip pain. She stated she will be having a procedure done on her hip soon.  Plan: Close patient's pharmacy case as most of her other medications are generic and are not eligible for patient assistance programs.  Notify [facility] staff still involved with the patient's care.  Elayne Guerin, PharmD, [place] Clinical Pharmacist [phone]

## 2019-11-19 ENCOUNTER — Other Ambulatory Visit: Payer: Self-pay | Admitting: Cardiovascular Disease

## 2019-11-23 DIAGNOSIS — M25561 Pain in right knee: Secondary | ICD-10-CM | POA: Diagnosis not present

## 2019-11-26 DIAGNOSIS — G894 Chronic pain syndrome: Secondary | ICD-10-CM | POA: Diagnosis not present

## 2019-11-26 DIAGNOSIS — R7301 Impaired fasting glucose: Secondary | ICD-10-CM | POA: Diagnosis not present

## 2019-11-26 DIAGNOSIS — E7849 Other hyperlipidemia: Secondary | ICD-10-CM | POA: Diagnosis not present

## 2019-11-26 DIAGNOSIS — Z1389 Encounter for screening for other disorder: Secondary | ICD-10-CM | POA: Diagnosis not present

## 2019-11-26 DIAGNOSIS — E039 Hypothyroidism, unspecified: Secondary | ICD-10-CM | POA: Diagnosis not present

## 2019-11-26 DIAGNOSIS — E1142 Type 2 diabetes mellitus with diabetic polyneuropathy: Secondary | ICD-10-CM | POA: Diagnosis not present

## 2019-11-26 DIAGNOSIS — Z0001 Encounter for general adult medical examination with abnormal findings: Secondary | ICD-10-CM | POA: Diagnosis not present

## 2019-12-01 DIAGNOSIS — G894 Chronic pain syndrome: Secondary | ICD-10-CM | POA: Diagnosis not present

## 2019-12-01 DIAGNOSIS — I10 Essential (primary) hypertension: Secondary | ICD-10-CM | POA: Diagnosis not present

## 2019-12-01 DIAGNOSIS — M1991 Primary osteoarthritis, unspecified site: Secondary | ICD-10-CM | POA: Diagnosis not present

## 2019-12-02 DIAGNOSIS — M25552 Pain in left hip: Secondary | ICD-10-CM | POA: Diagnosis not present

## 2019-12-04 DIAGNOSIS — M25552 Pain in left hip: Secondary | ICD-10-CM | POA: Diagnosis not present

## 2019-12-29 DIAGNOSIS — G894 Chronic pain syndrome: Secondary | ICD-10-CM | POA: Diagnosis not present

## 2019-12-29 DIAGNOSIS — M1991 Primary osteoarthritis, unspecified site: Secondary | ICD-10-CM | POA: Diagnosis not present

## 2019-12-29 DIAGNOSIS — I10 Essential (primary) hypertension: Secondary | ICD-10-CM | POA: Diagnosis not present

## 2019-12-30 DIAGNOSIS — S83281A Other tear of lateral meniscus, current injury, right knee, initial encounter: Secondary | ICD-10-CM | POA: Diagnosis not present

## 2019-12-30 DIAGNOSIS — S83231A Complex tear of medial meniscus, current injury, right knee, initial encounter: Secondary | ICD-10-CM | POA: Diagnosis not present

## 2019-12-30 DIAGNOSIS — M94261 Chondromalacia, right knee: Secondary | ICD-10-CM | POA: Diagnosis not present

## 2020-01-07 ENCOUNTER — Other Ambulatory Visit: Payer: Self-pay | Admitting: *Deleted

## 2020-01-15 DIAGNOSIS — I10 Essential (primary) hypertension: Secondary | ICD-10-CM | POA: Diagnosis not present

## 2020-01-15 DIAGNOSIS — E119 Type 2 diabetes mellitus without complications: Secondary | ICD-10-CM | POA: Diagnosis not present

## 2020-01-15 DIAGNOSIS — E785 Hyperlipidemia, unspecified: Secondary | ICD-10-CM | POA: Diagnosis not present

## 2020-01-15 NOTE — Patient Outreach (Addendum)
[facility]) Care Management  [place]  01/15/2020   [name] Mar 27, 1946 [id number]  RN Health Coach telephone call to patient.  Hipaa compliance verified. RN made patient aware that she was able to get an appointment for 01/18/2020 in [place] for the patient COVID vaccine. Patient blood pressure is doing better 130/80. Patient is monitoring her sodium in her diet. Patient is more aware of the labels on foods.  Patient has agreed to further outreach calls.    Encounter Medications:  Outpatient Encounter Medications as of 01/07/2020  Medication Sig Note  . acyclovir (ZOVIRAX) 400 MG tablet Take 400 mg by mouth 2 (two) times daily.    Marland Kitchen amLODipine (NORVASC) 10 MG tablet Take 10 mg by mouth daily.    Marland Kitchen aspirin EC 81 MG tablet Take 81 mg by mouth daily.   Marland Kitchen atorvastatin (LIPITOR) 40 MG tablet Take 40 mg by mouth daily.   . B Complex-C (B-COMPLEX WITH VITAMIN C) tablet Take 1 tablet by mouth daily.   . Biotin 5 MG CAPS Take 1 capsule by mouth daily.   . celecoxib (CELEBREX) 200 MG capsule Take 1 capsule by mouth daily.   . DULoxetine (CYMBALTA) 30 MG capsule Take 1 capsule by mouth daily.   Marland Kitchen EPINEPHrine 0.3 mg/0.3 mL IJ SOAJ injection Use as needed   . fluticasone (FLONASE) 50 MCG/ACT nasal spray Place 2 sprays into both nostrils as needed for allergies.    . furosemide (LASIX) 20 MG tablet Take 20-40 mg by mouth 2 (two) times daily. Take 40 mg in the morning and 20 mg in the evening 10/03/2019: Takes every other day   . hydrochlorothiazide (MICROZIDE) 12.5 MG capsule TAKE 1 CAPSULE BY MOUTH  DAILY   . HYDROcodone-acetaminophen (NORCO) 10-325 MG tablet Take 1 tablet by mouth every 4 (four) hours.   Marland Kitchen lisinopril (PRINIVIL,ZESTRIL) 40 MG tablet Take 40 mg by mouth daily.   . Magnesium 250 MG TABS Take 1 tablet by mouth daily.   . methocarbamol (ROBAXIN) 500 MG tablet Take 1 tablet by mouth every 8 (eight) hours as needed.   . Multiple Vitamins-Minerals  (PRESERVISION AREDS 2 PO) Take 2 tablets by mouth daily.    Marland Kitchen MYRBETRIQ 50 MG TB24 tablet Take 50 mg by mouth daily.   . polyethylene glycol powder (GLYCOLAX/MIRALAX) 17 GM/SCOOP powder Use 17 g daily as needed.   . Potassium Citrate 99 MG CAPS Take 1 capsule by mouth daily. 11/18/2019: PRN  . pyridOXINE (VITAMIN B-6) 100 MG tablet Take 100 mg by mouth daily.   . traMADol (ULTRAM) 50 MG tablet TAKE 1 2 TABLETS BY MOUTH 4 TIMES A DAY AS NEEDED PAIN, MAY ADD TO HYDROCODONE IF NEEDED   . traZODone (DESYREL) 150 MG tablet Take 150 mg by mouth at bedtime.   . vitamin B-12 (CYANOCOBALAMIN) 1000 MCG tablet Take 2,500 mcg by mouth daily.   . Vitamins-Lipotropics (EAR HEALTH PLUS PO) Take 1 tablet by mouth daily.    No facility-administered encounter medications on file as of 01/07/2020.    Functional Status:  In your present state of health, do you have any difficulty performing the following activities: 10/02/2019  Hearing? N  Vision? N  Difficulty concentrating or making decisions? N  Walking or climbing stairs? Y  Comment patient uses  cane/ has bursitus in hip  Dressing or bathing? N  Doing errands, shopping? Y  Comment husband assists  Conservation officer, nature and eating ? N  In the past six months,  have you accidently leaked urine? Y  Do you have problems with loss of bowel control? N  Managing your Medications? N  Managing your Finances? N  Housekeeping or managing your Housekeeping? N  Some recent data might be hidden    Fall/Depression Screening: Fall Risk  10/02/2019 07/27/2017 03/05/2014  Falls in the past year? 1 Yes Yes  Number falls in past yr: 0 1 1  Injury with Fall? 1 No Yes  Risk for fall due to : History of fall(s);Impaired balance/gait;Impaired mobility - History of fall(s);Impaired mobility  Follow up Falls evaluation completed;Education provided;Falls prevention discussed - -   PHQ 2/9 Scores 10/02/2019 03/05/2014  PHQ - 2 Score 1 2   THN CM Care Plan Problem One     Most Recent  Value  Care Plan Problem One  Knowledge Deficit in self management of Hypertension  Role Documenting the Problem One  [place] for Problem One  Active  THN Long Term Goal   Patient will not have a blood pressure over 140/80 within the next 90 days  THN Long Term Goal Start Date  01/15/20  Interventions for Problem One Long Term Goal  RN reiterated=s the goal readings. Patient blood pressure reading are better 130/80. RN will continue to monitor range.  THN CM Short Term Goal #1   Patient will check blood pressure and document within the next 30 days  THN CM Short Term Goal #1 Start Date  01/15/20  Interventions for Short Term Goal #1  N reiterated monitoring  blood presssure frequently. Patient has been checking but not daily and writing in binder. RN will follow up with encouragement.  THN CM Short Term Goal #2   Patient will have a better understanding of foods high and low in sodium within the next 30 days  THN CM Short Term Goal #2 Start Date  10/19/20  [facility] CM Short Term Goal #2 Met Date  01/15/20  Interventions for Short Term Goal #2  RN discussed foods high and low in sodium.  THN CM Short Term Goal #3  Patient will follow up on health care maintenance within the next 30 days  THN CM Short Term Goal #3 Start Date  01/15/20  Interventions for Short Tern Goal #3  RN discussed health maintenance. RN discussed receiving COVID vaccine. RN was able to get patient 1st COVID vaccine scheduled [id number]. Patient will follow up with pneumonia, flu and shingle vaccine. RN will follow up for further discussion.      Assessment:  BP is 130/80Patient is more aware of sodium in diet Patient is monitoring sodium in diet Patient will benefit from Health Coach telephonic outreach for education and support for hypertension self management.  Plan:  RN scheduled appointment for 1st COVID vaccine in [place] for  [id number] RN discussed blood pressure readings RN discussed low sodium  diet RN sent EMMI education  on High Blood Pressure Problems RN sent EMMI education on Stroke RN sent EMMI education on Heart Attack RN will follow up within the month of June  [place] Management [phone]

## 2020-01-15 NOTE — Patient Outreach (Signed)
[facility]) Care Management  [id number] ANTONINA PETROVIC February 12, 1946 [id number]   RN Health Coach telephone call to patient. Patient has a appointment for a COVID vaccine on April 1st in [place]. Patient lives in [place]. RN Health Coach discussed with patient that a new scheduling should come out on Monday. RN Health coach will try to get paint scheduled closer to home.  Plan Get patient scheduled closer to home.  [place] Care Management [phone]

## 2020-01-17 ENCOUNTER — Ambulatory Visit: Payer: Medicare Other | Attending: Internal Medicine

## 2020-01-17 DIAGNOSIS — Z23 Encounter for immunization: Secondary | ICD-10-CM

## 2020-01-17 NOTE — Progress Notes (Signed)
   Covid-19 Vaccination Clinic  Name:  JUWANDA LEGGIO    MRN: [id number] DOB: 03/09/1946  01/17/2020  Ms. Sadosky was observed post Covid-19 immunization for 15 minutes without incident. She was provided with Vaccine Information Sheet and instruction to access the V-Safe system.   Ms. Sentman was instructed to call 911 with any severe reactions post vaccine: Marland Kitchen Difficulty breathing  . Swelling of face and throat  . A fast heartbeat  . A bad rash all over body  . Dizziness and weakness   Immunizations Administered    Name Date Dose VIS Date Route   Moderna COVID-19 Vaccine 01/17/2020 11:16 AM 0.5 mL 10/01/2019 Intramuscular   Manufacturer: Moderna   Lot: [id number]   [id number]

## 2020-01-22 DIAGNOSIS — G894 Chronic pain syndrome: Secondary | ICD-10-CM | POA: Diagnosis not present

## 2020-01-22 DIAGNOSIS — M2550 Pain in unspecified joint: Secondary | ICD-10-CM | POA: Diagnosis not present

## 2020-01-29 DIAGNOSIS — E7849 Other hyperlipidemia: Secondary | ICD-10-CM | POA: Diagnosis not present

## 2020-01-29 DIAGNOSIS — M1991 Primary osteoarthritis, unspecified site: Secondary | ICD-10-CM | POA: Diagnosis not present

## 2020-01-29 DIAGNOSIS — I10 Essential (primary) hypertension: Secondary | ICD-10-CM | POA: Diagnosis not present

## 2020-02-05 DIAGNOSIS — M25561 Pain in right knee: Secondary | ICD-10-CM | POA: Diagnosis not present

## 2020-02-18 ENCOUNTER — Ambulatory Visit: Payer: Medicare Other | Attending: Internal Medicine

## 2020-02-18 DIAGNOSIS — Z23 Encounter for immunization: Secondary | ICD-10-CM

## 2020-02-18 NOTE — Progress Notes (Signed)
   Covid-19 Vaccination Clinic  Name:  [name]    MRN: [id number] DOB: 11/29/1945  02/18/2020  [name] was observed post Covid-19 immunization for 15 minutes without incident. She was provided with Vaccine Information Sheet and instruction to access the V-Safe system.   [name] was instructed to call 911 with any severe reactions post vaccine: Marland Kitchen Difficulty breathing  . Swelling of face and throat  . A fast heartbeat  . A bad rash all over body  . Dizziness and weakness   Immunizations Administered    Name Date Dose VIS Date Route   Moderna COVID-19 Vaccine 02/18/2020  9:50 AM 0.5 mL 10/2019 Intramuscular   Manufacturer: Moderna   Lot: [id number]   [id number]

## 2020-02-19 ENCOUNTER — Emergency Department (HOSPITAL_COMMUNITY): Payer: Medicare Other

## 2020-02-19 ENCOUNTER — Emergency Department (HOSPITAL_COMMUNITY)
Admission: EM | Admit: 2020-02-19 | Discharge: 2020-02-19 | Disposition: A | Discharge status: Home / Self Care | Payer: Medicare Other | Attending: Emergency Medicine | Admitting: Emergency Medicine

## 2020-02-19 ENCOUNTER — Other Ambulatory Visit: Payer: Self-pay

## 2020-02-19 ENCOUNTER — Encounter (HOSPITAL_COMMUNITY): Payer: Self-pay

## 2020-02-19 DIAGNOSIS — E119 Type 2 diabetes mellitus without complications: Secondary | ICD-10-CM | POA: Insufficient documentation

## 2020-02-19 DIAGNOSIS — Z79899 Other long term (current) drug therapy: Secondary | ICD-10-CM | POA: Insufficient documentation

## 2020-02-19 DIAGNOSIS — R519 Headache, unspecified: Secondary | ICD-10-CM | POA: Diagnosis not present

## 2020-02-19 DIAGNOSIS — R0602 Shortness of breath: Secondary | ICD-10-CM | POA: Diagnosis not present

## 2020-02-19 DIAGNOSIS — R110 Nausea: Secondary | ICD-10-CM | POA: Diagnosis not present

## 2020-02-19 DIAGNOSIS — Z7984 Long term (current) use of oral hypoglycemic drugs: Secondary | ICD-10-CM | POA: Diagnosis not present

## 2020-02-19 DIAGNOSIS — I10 Essential (primary) hypertension: Secondary | ICD-10-CM | POA: Insufficient documentation

## 2020-02-19 DIAGNOSIS — Z7982 Long term (current) use of aspirin: Secondary | ICD-10-CM | POA: Diagnosis not present

## 2020-02-19 LAB — URINALYSIS, ROUTINE W REFLEX MICROSCOPIC
Bilirubin Urine: NEGATIVE
Glucose, UA: NEGATIVE mg/dL
Hgb urine dipstick: NEGATIVE
Ketones, ur: NEGATIVE mg/dL
Nitrite: NEGATIVE
Protein, ur: NEGATIVE mg/dL
Specific Gravity, Urine: 1.012 (ref 1.005–1.030)
pH: 5 (ref 5.0–8.0)

## 2020-02-19 LAB — COMPREHENSIVE METABOLIC PANEL
ALT: 20 U/L (ref 0–44)
AST: 15 U/L (ref 15–41)
Albumin: 4 g/dL (ref 3.5–5.0)
Alkaline Phosphatase: 93 U/L (ref 38–126)
Anion gap: 12 (ref 5–15)
BUN: 32 mg/dL — ABNORMAL HIGH (ref 8–23)
CO2: 29 mmol/L (ref 22–32)
Calcium: 9.2 mg/dL (ref 8.9–10.3)
Chloride: 100 mmol/L (ref 98–111)
Creatinine, Ser: 1.41 mg/dL — ABNORMAL HIGH (ref 0.44–1.00)
GFR calc Af Amer: 42 mL/min — ABNORMAL LOW (ref >60)
GFR calc non Af Amer: 36 mL/min — ABNORMAL LOW (ref >60)
Glucose, Bld: 168 mg/dL — ABNORMAL HIGH (ref 70–99)
Potassium: 3.1 mmol/L — ABNORMAL LOW (ref 3.5–5.1)
Sodium: 141 mmol/L (ref 135–145)
Total Bilirubin: 1.1 mg/dL (ref 0.3–1.2)
Total Protein: 7.1 g/dL (ref 6.5–8.1)

## 2020-02-19 LAB — TROPONIN I (HIGH SENSITIVITY)
Troponin I (High Sensitivity): 5 ng/L (ref <18)
Troponin I (High Sensitivity): 6 ng/L (ref <18)

## 2020-02-19 LAB — CBC WITH DIFFERENTIAL/PLATELET
Abs Immature Granulocytes: 0.09 10*3/uL — ABNORMAL HIGH (ref 0.00–0.07)
Basophils Absolute: 0.1 10*3/uL (ref 0.0–0.1)
Basophils Relative: 1 %
Eosinophils Absolute: 0.1 10*3/uL (ref 0.0–0.5)
Eosinophils Relative: 1 %
HCT: 43.9 % (ref 36.0–46.0)
Hemoglobin: 14.3 g/dL (ref 12.0–15.0)
Immature Granulocytes: 1 %
Lymphocytes Relative: 16 %
Lymphs Abs: 1.6 10*3/uL (ref 0.7–4.0)
MCH: 30.4 pg (ref 26.0–34.0)
MCHC: 32.6 g/dL (ref 30.0–36.0)
MCV: 93.4 fL (ref 80.0–100.0)
Monocytes Absolute: 0.8 10*3/uL (ref 0.1–1.0)
Monocytes Relative: 8 %
Neutro Abs: 7.4 10*3/uL (ref 1.7–7.7)
Neutrophils Relative %: 73 %
Platelets: 266 10*3/uL (ref 150–400)
RBC: 4.7 MIL/uL (ref 3.87–5.11)
RDW: 13 % (ref 11.5–15.5)
WBC: 9.9 10*3/uL (ref 4.0–10.5)
nRBC: 0 % (ref 0.0–0.2)

## 2020-02-19 LAB — D-DIMER, QUANTITATIVE: D-Dimer, Quant: 1.25 ug/mL-FEU — ABNORMAL HIGH (ref 0.00–0.50)

## 2020-02-19 MED ORDER — IOHEXOL 350 MG/ML SOLN
100.0000 mL | Freq: Once | INTRAVENOUS | Status: AC | PRN
  Administered 2020-02-19: 75 mL via INTRAVENOUS

## 2020-02-19 MED ORDER — POTASSIUM CHLORIDE CRYS ER 20 MEQ PO TBCR
40.0000 meq | EXTENDED_RELEASE_TABLET | Freq: Once | ORAL | Status: AC
  Administered 2020-02-19: 40 meq via ORAL
  Filled 2020-02-19: qty 2

## 2020-02-19 MED ORDER — METOCLOPRAMIDE HCL 10 MG PO TABS: 10.0000 mg | ORAL_TABLET | Freq: Once | ORAL | Status: DC

## 2020-02-19 MED ORDER — ACETAMINOPHEN 325 MG PO TABS
650.0000 mg | ORAL_TABLET | Freq: Once | ORAL | Status: AC
  Administered 2020-02-19: 650 mg via ORAL
  Filled 2020-02-19: qty 2

## 2020-02-19 NOTE — ED Provider Notes (Signed)
Medical screening examination/treatment/procedure(s) were conducted as a shared visit with non-physician practitioner(s) and myself.  I personally evaluated the patient during the encounter.  Clinical Impression:   Final diagnoses:  SOB (shortness of breath)    This very pleasant 74 year old female was in her usual state of health until several hours ago while walking around her house and she developed acute onset of shortness of breath.  She has a headache, mild nausea, although the symptoms seem to improve but are still present.  There is some myalgias as well.  She reports recently having arthroscopic surgery of the right, this was not any replacement and she is not anticoagulated.  She denies a history of cardiac disease, pulmonary disease, pre-existing COPD emphysema or congestive heart failure.  She does not take any anticoagulants.  The patient did receive her second dose of the Covid vaccine yesterday.  She did not have similar findings after her first dose a month ago.  On exam the patient has no edema, no swelling of the legs, the knees are bilaterally very supple with range of motion.  Her abdomen is soft, heart is regular without ectopy or murmurs and the lungs are clear without wheezing rhonchi or no tachycardia hypoxia fevers hypo or hypertension.  She is well-appearing.  Please see EKG read below.   EKG Interpretation  Date/Time:  Wednesday February 19 2020 15:28:38 EDT Ventricular Rate:  79 PR Interval:  146 QRS Duration: 92 QT Interval:  410 QTC Calculation: 470 R Axis:   37 Text Interpretation: Normal sinus rhythm Nonspecific T wave abnormality Prolonged QT Abnormal ECG since last tracing no significant change Confirmed by Noemi Chapel [phone]) on 02/19/2020 3:50:59 PM          Noemi Chapel, MD 02/20/20 1500

## 2020-02-19 NOTE — ED Provider Notes (Signed)
[facility] EMERGENCY DEPARTMENT Provider Note   CSN: [id number] Arrival date & time: 02/19/20  1521     History Chief Complaint  Patient presents with  . Shortness of Breath    [name] is a 74 y.o. female with a pertinent past medical history of hypertension, hyperlipidemia, atypical chest pain that presents to the emergency department for constant shortness of breath, chills, headache, nausea that began at 10 AM this morning.  Patient states that she received her second Covid vaccine, Moderna yesterday afternoon.  She states that she did not have any side effects from her first vaccination.  She states that her shortness of breath is nonexertional and occurs at rest.  She does not report a fever.  She states that she took 2 puffs of her husband's albuterol and thinks that that did help earlier this morning.  She states that sitting up makes her feel better.  She denies any history of CHF, COPD, asthma.  She denies any fevers or being around anyone that was sick.  She describes her headache as an 8/10 without any radiation.  She denies any vision changes, neck pain, abdominal Pain, back pain, chest pain, near syncope, dizziness, leg edema,hemoptysis.  She states that she has been urinating normally.  She states that she has been eating and drinking normally.  She states that she lives at home with her husband and is pretty active.  She did have knee surgery on March 1.  She denies any smoking or alcohol use.  She denies any long travel or history of cancer.  Patient is not on oxygen at home.  HPI     Past Medical History:  Diagnosis Date  . Anginal pain ([place])   . Atypical chest pain   . Depression   . Hyperlipidemia   . Hypertension   . Kidney tumor   . Obesity     Patient Active Problem List   Diagnosis Date Noted  . Hyperlipidemia 03/02/2016  . Atypical chest pain 03/02/2016  . Ileitis   . Abdominal pain 11/11/2015  . Hypertension 11/11/2015  . Diabetes ([place])  11/11/2015  . Diabetic neuropathy associated with type 2 diabetes mellitus ([place]) 05/29/2014  . Generalized anxiety disorder 08/31/2011    Past Surgical History:  Procedure Laterality Date  . ABDOMINAL HYSTERECTOMY    . CARDIAC CATHETERIZATION  2009   normal coronary arteries, preserved LVEF 60%  . CHOLECYSTECTOMY  2006   Dr. Hulen Skains.  . COLONOSCOPY  2004   Dr. Collene Mares: hemorrhoids, diverticulosis, some stool present and small lesions could have been missed. repeat colonoscopy in five years.   . COLONOSCOPY  2016   Dr. Collene Mares: unremarkable per patient  . EMBOLIZATION    . HERNIA REPAIR    . TONSILLECTOMY       OB History   No obstetric history on file.     Family History  Problem Relation Age of Onset  . Diabetes Mother   . Hypertension Mother   . Colon cancer Brother        deceased age 47  . Stroke Father   . Cancer Sister        breast  . Breast cancer Sister   . Other Son        chron's   . Breast cancer Maternal Grandmother     Social History   Tobacco Use  . Smoking status: Never Smoker  . Smokeless tobacco: Never Used  Substance Use Topics  . Alcohol use: No  .  Drug use: No    Home Medications Prior to Admission medications   Medication Sig Start Date End Date Taking? Authorizing Provider  acyclovir (ZOVIRAX) 400 MG tablet Take 400 mg by mouth 2 (two) times daily.     [provider]  amLODipine (NORVASC) 10 MG tablet Take 10 mg by mouth daily.     [provider]  aspirin EC 81 MG tablet Take 81 mg by mouth daily.    [provider]  atorvastatin (LIPITOR) 40 MG tablet Take 40 mg by mouth daily. 08/15/19   [provider]  B Complex-C (B-COMPLEX WITH VITAMIN C) tablet Take 1 tablet by mouth daily.    [provider]  Biotin 5 MG CAPS Take 1 capsule by mouth daily.    [provider]  celecoxib (CELEBREX) 200 MG capsule Take 1 capsule by mouth daily.    [provider]  DULoxetine (CYMBALTA) 30  MG capsule Take 1 capsule by mouth daily.    [provider]  EPINEPHrine 0.3 mg/0.3 mL IJ SOAJ injection Use as needed    [provider]  fluticasone (FLONASE) 50 MCG/ACT nasal spray Place 2 sprays into both nostrils as needed for allergies.     [provider]  furosemide (LASIX) 20 MG tablet Take 20-40 mg by mouth 2 (two) times daily. Take 40 mg in the morning and 20 mg in the evening    [provider]  hydrochlorothiazide (MICROZIDE) 12.5 MG capsule TAKE 1 CAPSULE BY MOUTH  DAILY 11/20/19   Lorretta Harp, MD  HYDROcodone-acetaminophen (NORCO) 10-325 MG tablet Take 1 tablet by mouth every 4 (four) hours.    [provider]  lisinopril (PRINIVIL,ZESTRIL) 40 MG tablet Take 40 mg by mouth daily.    [provider]  Magnesium 250 MG TABS Take 1 tablet by mouth daily.    [provider]  methocarbamol (ROBAXIN) 500 MG tablet Take 1 tablet by mouth every 8 (eight) hours as needed.    [provider]  Multiple Vitamins-Minerals (PRESERVISION AREDS 2 PO) Take 2 tablets by mouth daily.     [provider]  MYRBETRIQ 50 MG TB24 tablet Take 50 mg by mouth daily. 09/13/19   [provider]  polyethylene glycol powder (GLYCOLAX/MIRALAX) 17 GM/SCOOP powder Use 17 g daily as needed. 05/18/16   [provider]  Potassium Citrate 99 MG CAPS Take 1 capsule by mouth daily.    [provider]  pyridOXINE (VITAMIN B-6) 100 MG tablet Take 100 mg by mouth daily.    [provider]  traMADol (ULTRAM) 50 MG tablet TAKE 1 2 TABLETS BY MOUTH 4 TIMES A DAY AS NEEDED PAIN, MAY ADD TO HYDROCODONE IF NEEDED 11/05/19   [provider]  traZODone (DESYREL) 150 MG tablet Take 150 mg by mouth at bedtime. 10/30/15   [provider]  vitamin B-12 (CYANOCOBALAMIN) 1000 MCG tablet Take 2,500 mcg by mouth daily.    [provider]  Vitamins-Lipotropics (EAR HEALTH PLUS PO) Take 1 tablet by  mouth daily.    [provider]    Allergies    Bee venom  Review of Systems   Review of Systems  Constitutional: Positive for chills.  Respiratory: Positive for shortness of breath.   Gastrointestinal: Positive for nausea.  Neurological: Positive for headaches.   Ten systems are reviewed by me and are negative for acute changes, except as noted in the HPI.  Marland KitchenPhysical Exam Updated Vital Signs BP 134/65  Pulse 77   Temp 98.3 F (36.8 C) (Oral)   Resp 12   Ht 5\' 8"  (1.727 m)   Wt 90.7 kg   SpO2 100%   BMI 30.41 kg/m   Physical Exam PE: Constitutional: well-developed, well-nourished, no apparent distress, no respiratory distress, patient sitting upright on bed comfortably HENT: normocephalic, atraumatic Cardiovascular: normal rate and rhythm, distal pulses intact Pulmonary/Chest: effort normal; breath sounds clear and equal bilaterally; no wheezes or rales Abdominal: soft and nontender Musculoskeletal: full ROM, no edema, right knee tenderness due to previous surgery Lymphadenopathy: no cervical adenopathy Neuro: Alert. Clear speech. No facial droop. CNIII-XII grossly intact. Bilateral upper and lower extremities' sensation grossly intact. 5/5 symmetric strength with grip strength and with plantar and dorsi flexion bilaterally. Normal finger to nose bilaterally. Negative pronator drift.  Skin: warm and dry, no rash, no diaphoresis Psychiatric: normal mood and affect, normal behavior   ED Results / Procedures / Treatments   Labs (all labs ordered are listed, but only abnormal results are displayed) Labs Reviewed  CBC WITH DIFFERENTIAL/PLATELET - Abnormal; Notable for the following components:      Result Value   Abs Immature Granulocytes 0.09 (*)    All other components within normal limits  COMPREHENSIVE METABOLIC PANEL - Abnormal; Notable for the following components:   Potassium 3.1 (*)    Glucose, Bld 168 (*)    BUN 32 (*)    Creatinine, Ser 1.41 (*)     GFR calc non Af Amer 36 (*)    GFR calc Af Amer 42 (*)    All other components within normal limits  URINALYSIS, ROUTINE W REFLEX MICROSCOPIC - Abnormal; Notable for the following components:   APPearance HAZY (*)    Leukocytes,Ua MODERATE (*)    Bacteria, UA RARE (*)    Non Squamous Epithelial 0-5 (*)    All other components within normal limits  D-DIMER, QUANTITATIVE (NOT AT [facility]) - Abnormal; Notable for the following components:   D-Dimer, Quant 1.25 (*)    All other components within normal limits  URINE CULTURE  TROPONIN I (HIGH SENSITIVITY)  TROPONIN I (HIGH SENSITIVITY)    EKG EKG Interpretation  Date/Time:  Wednesday February 19 2020 15:28:38 EDT Ventricular Rate:  79 PR Interval:  146 QRS Duration: 92 QT Interval:  410 QTC Calculation: 470 R Axis:   37 Text Interpretation: Normal sinus rhythm Nonspecific T wave abnormality Prolonged QT Abnormal ECG since last tracing no significant change Confirmed by Noemi Chapel [phone]) on 02/19/2020 3:50:59 PM   Radiology CT Angio Chest PE W/Cm &/Or Wo Cm  Result Date: 02/19/2020 CLINICAL DATA:  Shortness of breath, chills, headache, elevated D dimer EXAM: CT ANGIOGRAPHY CHEST WITH CONTRAST TECHNIQUE: Multidetector CT imaging of the chest was performed using the standard protocol during bolus administration of intravenous contrast. Multiplanar CT image reconstructions and MIPs were obtained to evaluate the vascular anatomy. CONTRAST:  3mL OMNIPAQUE IOHEXOL 350 MG/ML SOLN COMPARISON:  05/09/2018, 02/19/2020 FINDINGS: Cardiovascular: This is a technically adequate evaluation of the pulmonary vasculature. No filling defects or pulmonary emboli. Heart is unremarkable without pericardial effusion. Stable aneurysmal dilatation of the ascending thoracic aorta measuring 3.8 cm. No dissection. Mild atherosclerosis of the thoracic aorta. Mediastinum/Nodes: No enlarged mediastinal, hilar, or axillary lymph nodes. Thyroid gland, trachea, and esophagus  demonstrate no significant findings. Small hiatal hernia. Lungs/Pleura: No airspace disease, effusion, or pneumothorax. Minimal scarring or atelectasis in the lower lobes. Central airways are patent. Upper Abdomen: Stable right adrenal adenoma. Stable calcified  benign left renal nodule. Musculoskeletal: No acute or destructive bony lesions. Reconstructed images demonstrate no additional findings. Review of the MIP images confirms the above findings. IMPRESSION: 1. No acute cardiopulmonary process. No evidence of pulmonary embolus. 2. Stable aneurysmal dilatation of the ascending thoracic aorta measuring 3.8 cm. 3. Aortic Atherosclerosis (ICD10-I70.0). Electronically Signed   By: Randa Ngo M.D.   On: 02/19/2020 18:59   DG Chest Port 1 View  Result Date: 02/19/2020 CLINICAL DATA:  Shortness of breath EXAM: PORTABLE CHEST 1 VIEW COMPARISON:  05/20/2019, 05/09/2018 FINDINGS: The heart size and mediastinal contours are within normal limits. Both lungs are clear. The visualized skeletal structures are unremarkable. Aortic atherosclerosis. IMPRESSION: No active disease. Electronically Signed   By: Donavan Foil M.D.   On: 02/19/2020 16:50    Procedures Procedures (including critical care time)  Medications Ordered in ED Medications  acetaminophen (TYLENOL) tablet 650 mg (650 mg Oral Given 02/19/20 1835)  iohexol (OMNIPAQUE) 350 MG/ML injection 100 mL (75 mLs Intravenous Contrast Given 02/19/20 1818)  potassium chloride SA (KLOR-CON) CR tablet 40 mEq (40 mEq Oral Given 02/19/20 1835)    ED Course  I have reviewed the triage vital signs and the nursing notes.  Pertinent labs & imaging results that were available during my care of the patient were reviewed by me and considered in my medical decision making (see chart for details).  Clinical Course as of Feb 20 1151  Wed Feb 19, 2020  1708 D-Dimer, America Brown(!): 1.25 [SP]    Clinical Course User Index [SP] Alfredia Client, PA-C   MDM  Rules/Calculators/A&P                     [name] is a 74 y.o. female with a pertinent past medical history of hypertension, hyperlipidemia, atypical chest pain that presents to the emergency department for constant shortness of breath, chills, headache, nausea that began at 10 AM this morning.  Patient just received second dose of Covid vaccine yesterday.  Labs to include CBC, CMP, UA, troponin, EKG, chest x-ray.  D-dimer also ordered due to patient's knee surgery last month.  Differential to include ACS, pneumonia, pneumothorax, pleural effusion, PE, reaction to Covid vaccine.  D-dimer elevated 1.25.  CT angiogram ordered.    I ordered, reviewed and interpreted labs which included stable CBC and Troponin 1. Potassium showed 3.1, 40 mEq given today.  I ordered medications in the ER to include Tylenol for headache relief.  Patient did not anything for nausea she stated that this has resolved.  EKG unchanged from last time. I ordered imaging studies to include chest x-ray and I independently reviewed and interpreted by me which showed no active cardiopulmonary disease.  CT angiogram showed no evidence of PE.    Patient is stable for discharge. Headache is much improved and patient states that she feels better. Pt with follow up with her PCP as soon as possible, pt seems reliable. Shortness of breath, HA, chills most likely from COVID vaccine side effect. Workup not suggestive of acute  cardiopulm disease at this time. Strong ER return precautions given - worsening shortness of breath, chest pain, dizziness, weakness. PT agreeable to discharge.  UA with leukocytosis with large amount of squamous, most likely contamination. Urine culture ordered. Shared decision making to not start antibiotics for UTI now and wait until culture results. Pt is not having any urinary symptoms.   I discussed this case with my attending physician, Dr.Miller, who cosigned this note including  patient's presenting  symptoms, physical exam, and planned diagnostics and interventions. Attending physician stated agreement with plan or made changes to plan which were implemented.     Attending physician assessed patient at bedside. tpFinal Clinical Impression(s) / ED Diagnoses Final diagnoses:  SOB (shortness of breath)    Rx / DC Orders ED Discharge Orders    None       Alfredia Client, PA-C 02/20/20 1201    Noemi Chapel, MD 02/20/20 1501

## 2020-02-19 NOTE — Discharge Instructions (Addendum)
You were seen today in the emergency department for shortness of breath.  Your blood work and chest x-ray were reassuring.  Your CT scan did not show any clots or anything that needs immediate attention.  It did show  Stable aneurysmal dilatation of the ascending thoracic aorta measuring 3.8 cm and aortic Atherosclerosis , both which I want you to talk to your primary care doctor about.  Take Tylenol as needed as directed on the bottle if you continue to have headache.  Your urine culture is pending and someone will inform you if you have a urinary tract infection in the next couple of days.  Come back to the emergency department for worsening shortness of breath, chest pain, dizziness, feeling lightheaded, weakness.

## 2020-02-19 NOTE — ED Triage Notes (Signed)
Pt presents to ED with complaints of SOB since 1300 today. Pt states she received her 2nd Covid vaccine yesterday and started with SOB, chills, headache at 1300.

## 2020-02-21 LAB — URINE CULTURE: Culture: <10000 — AB

## 2020-02-28 DIAGNOSIS — M1990 Unspecified osteoarthritis, unspecified site: Secondary | ICD-10-CM | POA: Diagnosis not present

## 2020-02-28 DIAGNOSIS — I10 Essential (primary) hypertension: Secondary | ICD-10-CM | POA: Diagnosis not present

## 2020-02-28 DIAGNOSIS — G894 Chronic pain syndrome: Secondary | ICD-10-CM | POA: Diagnosis not present

## 2020-02-28 DIAGNOSIS — E1165 Type 2 diabetes mellitus with hyperglycemia: Secondary | ICD-10-CM | POA: Diagnosis not present

## 2020-03-11 ENCOUNTER — Ambulatory Visit: Payer: Medicare Other | Admitting: Cardiovascular Disease

## 2020-03-11 ENCOUNTER — Other Ambulatory Visit: Payer: Self-pay

## 2020-03-11 ENCOUNTER — Encounter: Payer: Self-pay | Admitting: Cardiovascular Disease

## 2020-03-11 DIAGNOSIS — M25552 Pain in left hip: Secondary | ICD-10-CM | POA: Diagnosis not present

## 2020-03-11 DIAGNOSIS — R0789 Other chest pain: Secondary | ICD-10-CM

## 2020-03-11 DIAGNOSIS — I10 Essential (primary) hypertension: Secondary | ICD-10-CM | POA: Diagnosis not present

## 2020-03-11 DIAGNOSIS — E782 Mixed hyperlipidemia: Secondary | ICD-10-CM | POA: Diagnosis not present

## 2020-03-11 DIAGNOSIS — M5416 Radiculopathy, lumbar region: Secondary | ICD-10-CM | POA: Diagnosis not present

## 2020-03-11 MED ORDER — AMLODIPINE BESYLATE 10 MG PO TABS: 10.0000 mg | ORAL_TABLET | Freq: Every day | ORAL | 3 refills | Status: DC
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
## 2020-03-11 NOTE — Assessment & Plan Note (Signed)
History of atypical chest pain in the past with 2 normal normal cardiac catheterizations performed by Dr. Doreatha Lew remotely and negative Myoview stress test 03/16/2016.

## 2020-03-11 NOTE — Assessment & Plan Note (Signed)
History of essential hypertension with blood pressure measured today at 162/86.  She is on amlodipine which unfortunately she is not currently taking because of pharmacy issues, and lisinopril as well as hydrochlorothiazide.

## 2020-03-11 NOTE — Patient Instructions (Signed)
Medication Instructions:  RESTART AMLODIPINE 10 MG ONCE DAILY  *If you need a refill on your cardiac medications before your next appointment, please call your pharmacy*   Lab Work: If you have labs (blood work) drawn today and your tests are completely normal, you will receive your results only by: Marland Kitchen MyChart Message (if you have MyChart) OR . A paper copy in the mail If you have any lab test that is abnormal or we need to change your treatment, we will call you to review the results.   Follow-Up: At [facility], you and your health needs are our priority.  As part of our continuing mission to provide you with exceptional heart care, we have created designated Provider Care Teams.  These Care Teams include your primary Cardiologist (physician) and Advanced Practice Providers (APPs -  Physician Assistants and Nurse Practitioners) who all work together to provide you with the care you need, when you need it.  We recommend signing up for the patient portal called "MyChart".  Sign up information is provided on this After Visit Summary.  MyChart is used to connect with patients for Virtual Visits (Telemedicine).  Patients are able to view lab/test results, encounter notes, upcoming appointments, etc.  Non-urgent messages can be sent to your provider as well.   To learn more about what you can do with MyChart, go to [email or web address].    Your next appointment:    Your physician wants you to follow-up in: [place] NP You will receive a reminder letter in the mail two months in advance. If you don't receive a letter, please call our office to schedule the follow-up appointment.   Your physician wants you to follow-up in: [place] will receive a reminder letter in the mail two months in advance. If you don't receive a letter, please call our office to schedule the follow-up appointment.

## 2020-03-11 NOTE — Assessment & Plan Note (Signed)
Anemia on statin therapy with lipid profile performed 11/26/2019 revealing total to 183, LDL of 98 and HDL of 60.

## 2020-03-12 DIAGNOSIS — I10 Essential (primary) hypertension: Secondary | ICD-10-CM | POA: Diagnosis not present

## 2020-03-12 DIAGNOSIS — G894 Chronic pain syndrome: Secondary | ICD-10-CM | POA: Diagnosis not present

## 2020-03-12 DIAGNOSIS — M21619 Bunion of unspecified foot: Secondary | ICD-10-CM | POA: Diagnosis not present

## 2020-03-12 DIAGNOSIS — E119 Type 2 diabetes mellitus without complications: Secondary | ICD-10-CM | POA: Diagnosis not present

## 2020-03-30 DIAGNOSIS — M1990 Unspecified osteoarthritis, unspecified site: Secondary | ICD-10-CM | POA: Diagnosis not present

## 2020-03-30 DIAGNOSIS — I10 Essential (primary) hypertension: Secondary | ICD-10-CM | POA: Diagnosis not present

## 2020-03-30 DIAGNOSIS — E7849 Other hyperlipidemia: Secondary | ICD-10-CM | POA: Diagnosis not present

## 2020-03-30 DIAGNOSIS — E1165 Type 2 diabetes mellitus with hyperglycemia: Secondary | ICD-10-CM | POA: Diagnosis not present

## 2020-04-16 ENCOUNTER — Ambulatory Visit: Payer: Self-pay | Admitting: *Deleted

## 2020-04-28 ENCOUNTER — Other Ambulatory Visit: Payer: Self-pay | Admitting: Internal Medicine

## 2020-04-28 DIAGNOSIS — Z1231 Encounter for screening mammogram for malignant neoplasm of breast: Secondary | ICD-10-CM

## 2020-04-29 ENCOUNTER — Other Ambulatory Visit: Payer: Self-pay | Admitting: *Deleted

## 2020-04-29 NOTE — Patient Outreach (Signed)
[facility]) Care Management  [place]  04/29/2020   [name] 01/19/1946 [id number]  RN Health Coach telephone call to patient.  Hipaa compliance verified. Per patient her blood pressures have been running 102/56 p 91, 105/59 p 73, 113/58 p 93, 130/66  p75.  Per patient she feels fatigued all the time. She has had some episodes of shortness of breath. Patent stated that when walking from the bedroom to the kitchen she has to set down. Patient stated that she felt that way after receiving the moderna shot. RN discussed follow up care with PCP. Patient has not been taking all her medications as prescribed. Patient potassium was low on ER visit and patient stated she didn't know she was suppose to be taking the potassium as ordered on the med list.   Patient has agreed to follow up outreach calls.    Encounter Medications:  Outpatient Encounter Medications as of 04/29/2020  Medication Sig Note  . acyclovir (ZOVIRAX) 400 MG tablet Take 400 mg by mouth 2 (two) times daily.    Marland Kitchen amLODipine (NORVASC) 10 MG tablet Take 1 tablet (10 mg total) by mouth daily.   Marland Kitchen aspirin EC 81 MG tablet Take 81 mg by mouth daily.   Marland Kitchen atorvastatin (LIPITOR) 40 MG tablet Take 40 mg by mouth daily.   . B Complex-C (B-COMPLEX WITH VITAMIN C) tablet Take 1 tablet by mouth daily.   . Biotin 5 MG CAPS Take 1 capsule by mouth daily.   . DULoxetine (CYMBALTA) 30 MG capsule Take 1 capsule by mouth daily.   Marland Kitchen EPINEPHrine 0.3 mg/0.3 mL IJ SOAJ injection Use as needed   . fluticasone (FLONASE) 50 MCG/ACT nasal spray Place 2 sprays into both nostrils as needed for allergies.    . furosemide (LASIX) 20 MG tablet Take 20-40 mg by mouth 2 (two) times daily. Take 40 mg in the morning and 20 mg in the evening 10/03/2019: Takes every other day   . hydrochlorothiazide (MICROZIDE) 12.5 MG capsule TAKE 1 CAPSULE BY MOUTH  DAILY   . HYDROcodone-acetaminophen (NORCO) 10-325 MG tablet Take 1 tablet by mouth  every 4 (four) hours.   Marland Kitchen lisinopril (PRINIVIL,ZESTRIL) 40 MG tablet Take 40 mg by mouth daily.   . Multiple Vitamins-Minerals (PRESERVISION AREDS 2 PO) Take 2 tablets by mouth daily.    Marland Kitchen MYRBETRIQ 50 MG TB24 tablet Take 50 mg by mouth daily.   Marland Kitchen pyridOXINE (VITAMIN B-6) 100 MG tablet Take 100 mg by mouth daily.   . traZODone (DESYREL) 150 MG tablet Take 150 mg by mouth at bedtime.   . vitamin B-12 (CYANOCOBALAMIN) 1000 MCG tablet Take 2,500 mcg by mouth daily.   . Vitamins-Lipotropics (EAR HEALTH PLUS PO) Take 1 tablet by mouth daily.   . celecoxib (CELEBREX) 200 MG capsule Take 1 capsule by mouth daily. (Patient not taking: Reported on 04/29/2020)   . Magnesium 250 MG TABS Take 1 tablet by mouth daily. (Patient not taking: Reported on 04/29/2020)   . methocarbamol (ROBAXIN) 500 MG tablet Take 1 tablet by mouth every 8 (eight) hours as needed. (Patient not taking: Reported on 04/29/2020)   . Potassium Citrate 99 MG CAPS Take 1 capsule by mouth daily. (Patient not taking: Reported on 04/29/2020) 11/18/2019: PRN  . traMADol (ULTRAM) 50 MG tablet TAKE 1 2 TABLETS BY MOUTH 4 TIMES A DAY AS NEEDED PAIN, MAY ADD TO HYDROCODONE IF NEEDED (Patient not taking: Reported on 04/29/2020)    No facility-administered encounter medications  on file as of 04/29/2020.    Functional Status:  In your present state of health, do you have any difficulty performing the following activities: 10/02/2019  Hearing? N  Vision? N  Difficulty concentrating or making decisions? N  Walking or climbing stairs? Y  Comment patient uses  cane/ has bursitus in hip  Dressing or bathing? N  Doing errands, shopping? Y  Comment husband assists  Conservation officer, nature and eating ? N  In the past six months, have you accidently leaked urine? Y  Do you have problems with loss of bowel control? N  Managing your Medications? N  Managing your Finances? N  Housekeeping or managing your Housekeeping? N  Some recent data might be hidden     Fall/Depression Screening: Fall Risk  10/02/2019 07/27/2017 03/05/2014  Falls in the past year? 1 Yes Yes  Number falls in past yr: 0 1 1  Injury with Fall? 1 No Yes  Risk for fall due to : History of fall(s);Impaired balance/gait;Impaired mobility - History of fall(s);Impaired mobility  Follow up Falls evaluation completed;Education provided;Falls prevention discussed - -   PHQ 2/9 Scores 10/02/2019 03/05/2014  PHQ - 2 Score 1 2    Assessment:  BP 102/56 p 91 Patient has received COVID vaccine Patient is having feelings of fatigued Patient has had ER visit Patient has not been taking her potassium( didn't know it was ordered) Plan:  Patient will schedule appointment with PCP Patient will continue to monitor blood pressure Patient has mammogram scheduled on July 16th RN will follow up within the month of July  Mattthew Ziomek [place] [facility] [phone]

## 2020-05-13 ENCOUNTER — Other Ambulatory Visit: Payer: Self-pay

## 2020-05-13 ENCOUNTER — Ambulatory Visit (HOSPITAL_COMMUNITY)
Admission: RE | Admit: 2020-05-13 | Discharge: 2020-05-13 | Disposition: A | Discharge status: Home / Self Care | Payer: Medicare Other | Source: Ambulatory Visit | Attending: Internal Medicine | Admitting: Internal Medicine

## 2020-05-13 ENCOUNTER — Other Ambulatory Visit (HOSPITAL_COMMUNITY): Payer: Self-pay | Admitting: Internal Medicine

## 2020-05-13 DIAGNOSIS — E7849 Other hyperlipidemia: Secondary | ICD-10-CM | POA: Diagnosis not present

## 2020-05-13 DIAGNOSIS — R0600 Dyspnea, unspecified: Secondary | ICD-10-CM | POA: Diagnosis not present

## 2020-05-13 DIAGNOSIS — E559 Vitamin D deficiency, unspecified: Secondary | ICD-10-CM | POA: Diagnosis not present

## 2020-05-13 DIAGNOSIS — R5383 Other fatigue: Secondary | ICD-10-CM | POA: Diagnosis not present

## 2020-05-13 DIAGNOSIS — E538 Deficiency of other specified B group vitamins: Secondary | ICD-10-CM | POA: Diagnosis not present

## 2020-05-13 DIAGNOSIS — R0602 Shortness of breath: Secondary | ICD-10-CM | POA: Diagnosis not present

## 2020-05-13 DIAGNOSIS — I10 Essential (primary) hypertension: Secondary | ICD-10-CM | POA: Diagnosis not present

## 2020-05-15 ENCOUNTER — Ambulatory Visit
Admission: RE | Admit: 2020-05-15 | Discharge: 2020-05-15 | Disposition: A | Discharge status: Home / Self Care | Payer: Medicare Other | Source: Ambulatory Visit | Attending: Internal Medicine | Admitting: Internal Medicine

## 2020-05-15 ENCOUNTER — Other Ambulatory Visit: Payer: Self-pay

## 2020-05-15 DIAGNOSIS — Z1231 Encounter for screening mammogram for malignant neoplasm of breast: Secondary | ICD-10-CM | POA: Diagnosis not present

## 2020-05-20 ENCOUNTER — Other Ambulatory Visit: Payer: Self-pay | Admitting: Internal Medicine

## 2020-05-20 DIAGNOSIS — R928 Other abnormal and inconclusive findings on diagnostic imaging of breast: Secondary | ICD-10-CM

## 2020-05-21 DIAGNOSIS — M545 Low back pain: Secondary | ICD-10-CM | POA: Diagnosis not present

## 2020-05-21 DIAGNOSIS — M25561 Pain in right knee: Secondary | ICD-10-CM | POA: Diagnosis not present

## 2020-05-25 DIAGNOSIS — M25552 Pain in left hip: Secondary | ICD-10-CM | POA: Diagnosis not present

## 2020-05-27 ENCOUNTER — Other Ambulatory Visit: Payer: Self-pay

## 2020-05-27 ENCOUNTER — Ambulatory Visit
Admission: RE | Admit: 2020-05-27 | Discharge: 2020-05-27 | Disposition: A | Discharge status: Home / Self Care | Payer: Medicare Other | Source: Ambulatory Visit | Attending: Internal Medicine | Admitting: Internal Medicine

## 2020-05-27 DIAGNOSIS — R928 Other abnormal and inconclusive findings on diagnostic imaging of breast: Secondary | ICD-10-CM

## 2020-05-27 DIAGNOSIS — R922 Inconclusive mammogram: Secondary | ICD-10-CM | POA: Diagnosis not present

## 2020-05-27 DIAGNOSIS — N6012 Diffuse cystic mastopathy of left breast: Secondary | ICD-10-CM | POA: Diagnosis not present

## 2020-05-29 ENCOUNTER — Other Ambulatory Visit: Payer: Self-pay | Admitting: *Deleted

## 2020-05-29 DIAGNOSIS — I10 Essential (primary) hypertension: Secondary | ICD-10-CM | POA: Diagnosis not present

## 2020-05-29 DIAGNOSIS — M1990 Unspecified osteoarthritis, unspecified site: Secondary | ICD-10-CM | POA: Diagnosis not present

## 2020-05-29 DIAGNOSIS — E7849 Other hyperlipidemia: Secondary | ICD-10-CM | POA: Diagnosis not present

## 2020-05-29 DIAGNOSIS — E1165 Type 2 diabetes mellitus with hyperglycemia: Secondary | ICD-10-CM | POA: Diagnosis not present

## 2020-05-29 NOTE — Patient Outreach (Signed)
[facility]) Care Management  [place]  05/29/2020   [name] 1946/08/11 [id number]  RN Health Coach telephone call to patient.  Hipaa compliance verified. Per patient she is feeling much better and stronger. Patient has added potassium and Vitamin C to her medications she is taking. Patient has been checking her blood pressure weekly and the readings are better. Patient does not have a medical alert system. Patient has been having pain in her knee and has received a cortisone shot . Patient is follow through with mammogram. Patient has agreed to follow up outreach calls.   Encounter Medications:  Outpatient Encounter Medications as of 05/29/2020  Medication Sig Note  . acyclovir (ZOVIRAX) 400 MG tablet Take 400 mg by mouth 2 (two) times daily.    Marland Kitchen amLODipine (NORVASC) 10 MG tablet Take 1 tablet (10 mg total) by mouth daily.   Marland Kitchen aspirin EC 81 MG tablet Take 81 mg by mouth daily.   Marland Kitchen atorvastatin (LIPITOR) 40 MG tablet Take 40 mg by mouth daily.   . B Complex-C (B-COMPLEX WITH VITAMIN C) tablet Take 1 tablet by mouth daily.   . Biotin 5 MG CAPS Take 1 capsule by mouth daily.   . celecoxib (CELEBREX) 200 MG capsule Take 1 capsule by mouth daily. (Patient not taking: Reported on 04/29/2020)   . DULoxetine (CYMBALTA) 30 MG capsule Take 1 capsule by mouth daily.   Marland Kitchen EPINEPHrine 0.3 mg/0.3 mL IJ SOAJ injection Use as needed   . fluticasone (FLONASE) 50 MCG/ACT nasal spray Place 2 sprays into both nostrils as needed for allergies.    . furosemide (LASIX) 20 MG tablet Take 20-40 mg by mouth 2 (two) times daily. Take 40 mg in the morning and 20 mg in the evening 10/03/2019: Takes every other day   . hydrochlorothiazide (MICROZIDE) 12.5 MG capsule TAKE 1 CAPSULE BY MOUTH  DAILY   . HYDROcodone-acetaminophen (NORCO) 10-325 MG tablet Take 1 tablet by mouth every 4 (four) hours.   Marland Kitchen lisinopril (PRINIVIL,ZESTRIL) 40 MG tablet Take 40 mg by mouth daily.   . Magnesium 250  MG TABS Take 1 tablet by mouth daily. (Patient not taking: Reported on 04/29/2020)   . methocarbamol (ROBAXIN) 500 MG tablet Take 1 tablet by mouth every 8 (eight) hours as needed. (Patient not taking: Reported on 04/29/2020)   . Multiple Vitamins-Minerals (PRESERVISION AREDS 2 PO) Take 2 tablets by mouth daily.    Marland Kitchen MYRBETRIQ 50 MG TB24 tablet Take 50 mg by mouth daily.   . Potassium Citrate 99 MG CAPS Take 1 capsule by mouth daily. (Patient not taking: Reported on 04/29/2020) 11/18/2019: PRN  . pyridOXINE (VITAMIN B-6) 100 MG tablet Take 100 mg by mouth daily.   . traMADol (ULTRAM) 50 MG tablet TAKE 1 2 TABLETS BY MOUTH 4 TIMES A DAY AS NEEDED PAIN, MAY ADD TO HYDROCODONE IF NEEDED (Patient not taking: Reported on 04/29/2020)   . traZODone (DESYREL) 150 MG tablet Take 150 mg by mouth at bedtime.   . vitamin B-12 (CYANOCOBALAMIN) 1000 MCG tablet Take 2,500 mcg by mouth daily.   . Vitamins-Lipotropics (EAR HEALTH PLUS PO) Take 1 tablet by mouth daily.    No facility-administered encounter medications on file as of 05/29/2020.    Functional Status:  In your present state of health, do you have any difficulty performing the following activities: 05/29/2020 10/02/2019  Hearing? N N  Vision? N N  Difficulty concentrating or making decisions? N N  Walking or climbing stairs?  Y Y  Comment nedsy=to use cane due to flare ups of bursitus patient uses  cane/ has bursitus in hip  Dressing or bathing? N N  Doing errands, shopping? Tempie Donning  Comment husband assists but he has been sick lately husband assists  Conservation officer, nature and eating ? N N  Using the Toilet? N -  In the past six months, have you accidently leaked urine? Y Y  Do you have problems with loss of bowel control? N N  Managing your Medications? N N  Managing your Finances? N N  Housekeeping or managing your Housekeeping? N N  Some recent data might be hidden    Fall/Depression Screening: Fall Risk  05/29/2020 10/02/2019 07/27/2017  Falls in the  past year? 1 1 Yes  Number falls in past yr: 1 0 1  Injury with Fall? 1 1 No  Risk for fall due to : History of fall(s);Impaired balance/gait;Impaired mobility History of fall(s);Impaired balance/gait;Impaired mobility -  Follow up Falls evaluation completed;Education provided;Falls prevention discussed Falls evaluation completed;Education provided;Falls prevention discussed -   PHQ 2/9 Scores 10/02/2019 03/05/2014  PHQ - 2 Score 1 2   Goals Addressed            This Visit's Progress   . Blood Pressure < 140/90       CARE PLAN ENTRY (see longtitudinal plan of care for additional care plan information)  Objective:  . Last practice recorded BP readings:  BP Readings from Last 3 Encounters:  03/11/20 (!) 162/86  02/19/20 134/65  10/24/19 (!) 155/78 .   Marland Kitchen Most recent eGFR/CrCl: No results found for: EGFR  No components found for: CRCL  Current Barriers:  Marland Kitchen Knowledge Deficits related to basic understanding of hypertension diagnosis . Knowledge deficit related to self care management of hypertension  Case Manager Clinical Goal(s):  Marland Kitchen Over the next 90 days, patient will verbalize understanding of plan for hypertension management . Over the next 90 days, patient will attend all scheduled medical appointments: PCP . Over the next 90 days, patient will demonstrate improved health management independence as evidenced by checking blood pressure as directed and notifying PCP if SBP>190 or DBP > 110, taking all medications as prescribe, and adhering to a low sodium diet as discussed. . Over the next 90 days, patient will verbalize basic understanding of hypertension disease process and self health management plan as evidenced by blood pressure 130/90 or .  Interventions:  . Evaluation of current treatment plan related to hypertension self management and patient's adherence to plan as established by provider. . Discussed plans with patient for ongoing care management follow up and provided  patient with direct contact information for care management team . Reviewed scheduled/upcoming provider appointments including:  . Provided education regarding s/s of stroke and stroke prevention . Provided education regarding s/s of heart attack . Provided education regarding s/s of DASH diet/Low salt diet . Provided education regarding increasing physical activity . Provided educational material: Exercise Program Activity Book  Patient Self Care Activities:  . Self administers medications as prescribed . Attends all scheduled provider appointments . Calls provider office for new concerns, questions, or BP outside discussed parameters . Monitors BP and records as discussed . Adheres to a low sodium diet/DASH diet . Increase physical activity as tolerated . Verbalize where to go to receive medical care  RN will follow up within the month of October       Assessment:  Patient does not have a medical alert system Patient  is doing home routine exercises Patient is adhering to low sodium diet Patient is monitoring blood pressure weekly Patient is taking medications as prescribed Plan:  RN discussed healthy eating RN discussed adding chair exercises to home exercise routine RN sent Exercise program activity booklet RN discussed medical alert system RN provided patient with number for ordering [facility] medical alert RN will follow up within the month of October RN sent update assessment to PCP  [place] [facility] [phone]

## 2020-06-04 DIAGNOSIS — R05 Cough: Secondary | ICD-10-CM | POA: Diagnosis not present

## 2020-06-04 DIAGNOSIS — E1140 Type 2 diabetes mellitus with diabetic neuropathy, unspecified: Secondary | ICD-10-CM | POA: Diagnosis not present

## 2020-06-04 DIAGNOSIS — K219 Gastro-esophageal reflux disease without esophagitis: Secondary | ICD-10-CM | POA: Diagnosis not present

## 2020-06-04 DIAGNOSIS — L089 Local infection of the skin and subcutaneous tissue, unspecified: Secondary | ICD-10-CM | POA: Diagnosis not present

## 2020-06-30 DIAGNOSIS — M1990 Unspecified osteoarthritis, unspecified site: Secondary | ICD-10-CM | POA: Diagnosis not present

## 2020-06-30 DIAGNOSIS — G894 Chronic pain syndrome: Secondary | ICD-10-CM | POA: Diagnosis not present

## 2020-06-30 DIAGNOSIS — E1165 Type 2 diabetes mellitus with hyperglycemia: Secondary | ICD-10-CM | POA: Diagnosis not present

## 2020-06-30 DIAGNOSIS — I10 Essential (primary) hypertension: Secondary | ICD-10-CM | POA: Diagnosis not present

## 2020-06-30 DIAGNOSIS — E7849 Other hyperlipidemia: Secondary | ICD-10-CM | POA: Diagnosis not present

## 2020-07-14 DIAGNOSIS — E119 Type 2 diabetes mellitus without complications: Secondary | ICD-10-CM | POA: Diagnosis not present

## 2020-07-14 DIAGNOSIS — R5383 Other fatigue: Secondary | ICD-10-CM | POA: Diagnosis not present

## 2020-07-14 DIAGNOSIS — R269 Unspecified abnormalities of gait and mobility: Secondary | ICD-10-CM | POA: Diagnosis not present

## 2020-07-14 DIAGNOSIS — E559 Vitamin D deficiency, unspecified: Secondary | ICD-10-CM | POA: Diagnosis not present

## 2020-07-23 DIAGNOSIS — R269 Unspecified abnormalities of gait and mobility: Secondary | ICD-10-CM | POA: Diagnosis not present

## 2020-07-23 DIAGNOSIS — I951 Orthostatic hypotension: Secondary | ICD-10-CM | POA: Diagnosis not present

## 2020-07-23 DIAGNOSIS — R296 Repeated falls: Secondary | ICD-10-CM | POA: Diagnosis not present

## 2020-07-23 DIAGNOSIS — R278 Other lack of coordination: Secondary | ICD-10-CM | POA: Diagnosis not present

## 2020-07-30 DIAGNOSIS — M1991 Primary osteoarthritis, unspecified site: Secondary | ICD-10-CM | POA: Diagnosis not present

## 2020-07-30 DIAGNOSIS — G894 Chronic pain syndrome: Secondary | ICD-10-CM | POA: Diagnosis not present

## 2020-07-30 DIAGNOSIS — I10 Essential (primary) hypertension: Secondary | ICD-10-CM | POA: Diagnosis not present

## 2020-08-12 DIAGNOSIS — Z23 Encounter for immunization: Secondary | ICD-10-CM | POA: Diagnosis not present

## 2020-08-12 DIAGNOSIS — G894 Chronic pain syndrome: Secondary | ICD-10-CM | POA: Diagnosis not present

## 2020-08-12 DIAGNOSIS — I10 Essential (primary) hypertension: Secondary | ICD-10-CM | POA: Diagnosis not present

## 2020-08-12 DIAGNOSIS — E1140 Type 2 diabetes mellitus with diabetic neuropathy, unspecified: Secondary | ICD-10-CM | POA: Diagnosis not present

## 2020-08-12 DIAGNOSIS — K219 Gastro-esophageal reflux disease without esophagitis: Secondary | ICD-10-CM | POA: Diagnosis not present

## 2020-08-24 DIAGNOSIS — M7542 Impingement syndrome of left shoulder: Secondary | ICD-10-CM | POA: Diagnosis not present

## 2020-08-24 DIAGNOSIS — M7552 Bursitis of left shoulder: Secondary | ICD-10-CM | POA: Diagnosis not present

## 2020-08-27 ENCOUNTER — Other Ambulatory Visit: Payer: Self-pay | Admitting: *Deleted

## 2020-08-27 NOTE — Patient Outreach (Signed)
[facility]) Care Management  [place]  08/27/2020   [name] [birth year] [id number]  RN Health Coach telephone call to patient.  Hipaa compliance verified. Per patient her blood pressure is better controlled. BP  Is 122/53. Patient is wearing support hose with good results. Per patient she is not exercising on regular basis. She does a lot of yard work for her exercise.Patient appetite is good. Blood sugar is 98 today. Patient has agreed to further out  Encounter Medications:  Outpatient Encounter Medications as of 08/27/2020  Medication Sig Note  . pyridostigmine (MESTINON) 60 MG tablet Take 60 mg by mouth 3 (three) times daily.   Marland Kitchen acyclovir (ZOVIRAX) 400 MG tablet Take 400 mg by mouth 2 (two) times daily.    Marland Kitchen amLODipine (NORVASC) 10 MG tablet Take 1 tablet (10 mg total) by mouth daily.   Marland Kitchen aspirin EC 81 MG tablet Take 81 mg by mouth daily.   Marland Kitchen atorvastatin (LIPITOR) 40 MG tablet Take 40 mg by mouth daily.   . B Complex-C (B-COMPLEX WITH VITAMIN C) tablet Take 1 tablet by mouth daily.   . Biotin 5 MG CAPS Take 1 capsule by mouth daily.   . celecoxib (CELEBREX) 200 MG capsule Take 1 capsule by mouth daily. (Patient not taking: Reported on 04/29/2020)   . DULoxetine (CYMBALTA) 30 MG capsule Take 1 capsule by mouth daily.   Marland Kitchen EPINEPHrine 0.3 mg/0.3 mL IJ SOAJ injection Use as needed   . fluticasone (FLONASE) 50 MCG/ACT nasal spray Place 2 sprays into both nostrils as needed for allergies.    . furosemide (LASIX) 20 MG tablet Take 20-40 mg by mouth 2 (two) times daily. Take 40 mg in the morning and 20 mg in the evening 10/03/2019: Takes every other day   . hydrochlorothiazide (MICROZIDE) 12.5 MG capsule TAKE 1 CAPSULE BY MOUTH  DAILY   . HYDROcodone-acetaminophen (NORCO) 10-325 MG tablet Take 1 tablet by mouth every 4 (four) hours.   Marland Kitchen lisinopril (PRINIVIL,ZESTRIL) 40 MG tablet Take 40 mg by mouth daily.   . Magnesium 250 MG TABS Take 1 tablet by mouth  daily. (Patient not taking: Reported on 04/29/2020)   . methocarbamol (ROBAXIN) 500 MG tablet Take 1 tablet by mouth every 8 (eight) hours as needed. (Patient not taking: Reported on 04/29/2020)   . Multiple Vitamins-Minerals (PRESERVISION AREDS 2 PO) Take 2 tablets by mouth daily.    Marland Kitchen MYRBETRIQ 50 MG TB24 tablet Take 50 mg by mouth daily.   . Potassium Citrate 99 MG CAPS Take 1 capsule by mouth daily. (Patient not taking: Reported on 04/29/2020) 11/18/2019: PRN  . pyridOXINE (VITAMIN B-6) 100 MG tablet Take 100 mg by mouth daily.   . traMADol (ULTRAM) 50 MG tablet TAKE 1 2 TABLETS BY MOUTH 4 TIMES A DAY AS NEEDED PAIN, MAY ADD TO HYDROCODONE IF NEEDED (Patient not taking: Reported on 04/29/2020)   . traZODone (DESYREL) 150 MG tablet Take 150 mg by mouth at bedtime.   . vitamin B-12 (CYANOCOBALAMIN) 1000 MCG tablet Take 2,500 mcg by mouth daily.   . Vitamins-Lipotropics (EAR HEALTH PLUS PO) Take 1 tablet by mouth daily.    No facility-administered encounter medications on file as of 08/27/2020.    Functional Status:  In your present state of health, do you have any difficulty performing the following activities: 05/29/2020 10/02/2019  Hearing? N N  Vision? N N  Difficulty concentrating or making decisions? N N  Walking or climbing stairs? Tempie Donning  Comment nedsy=to use cane due to flare ups of bursitus patient uses  cane/ has bursitus in hip  Dressing or bathing? N N  Doing errands, shopping? Tempie Donning  Comment husband assists but he has been sick lately husband assists  Conservation officer, nature and eating ? N N  Using the Toilet? N -  In the past six months, have you accidently leaked urine? Y Y  Do you have problems with loss of bowel control? N N  Managing your Medications? N N  Managing your Finances? N N  Housekeeping or managing your Housekeeping? N N  Some recent data might be hidden    Fall/Depression Screening: Fall Risk  08/27/2020 05/29/2020 10/02/2019  Falls in the past year? 1 1 1   Number falls  in past yr: 1 1 0  Injury with Fall? 1 1 1   Risk for fall due to : History of fall(s);Impaired balance/gait;Impaired mobility History of fall(s);Impaired balance/gait;Impaired mobility History of fall(s);Impaired balance/gait;Impaired mobility  Follow up Falls evaluation completed Falls evaluation completed;Education provided;Falls prevention discussed Falls evaluation completed;Education provided;Falls prevention discussed   PHQ 2/9 Scores 10/02/2019 03/05/2014  PHQ - 2 Score 1 2    Assessment:  Goals Addressed            This Visit's Progress   . (THN)Make and Keep All Appointments       Follow Up Date [id number]   - call to cancel if needed - keep a calendar with prescription refill dates - keep a calendar with appointment dates    Why is this important?   Part of staying healthy is seeing the doctor for follow-up care.  If you forget your appointments, there are some things you can do to stay on track.    Notes:     . (THN)Track and Manage My Blood Pressure       Follow Up Date [id number]   - check blood pressure daily - write blood pressure results in a log or diary    Why is this important?   You won't feel high blood pressure, but it can still hurt your blood vessels.  High blood pressure can cause heart or kidney problems. It can also cause a stroke.  Making lifestyle changes like losing a little weight or eating less salt will help.  Checking your blood pressure at home and at different times of the day can help to control blood pressure.  If the doctor prescribes medicine remember to take it the way the doctor ordered.  Call the office if you cannot afford the medicine or if there are questions about it.     Notes:        Plan:  Patient will monitor blood pressure and document Patient will follow up with COVID vaccine Patient will take medication as prescribed Patient will follow up on medical alert system delivery Patient will follow up on getting additional  pairs of compression hose RN will follow up outreach within the month of January RN will send update assessment to PCP  [place] [facility] [phone]

## 2020-08-27 NOTE — Patient Instructions (Signed)
Goals Addressed            This Visit's Progress   . (THN)Make and Keep All Appointments       Follow Up Date [id number]   - call to cancel if needed - keep a calendar with prescription refill dates - keep a calendar with appointment dates    Why is this important?   Part of staying healthy is seeing the doctor for follow-up care.  If you forget your appointments, there are some things you can do to stay on track.    Notes:     . (THN)Track and Manage My Blood Pressure       Follow Up Date [id number]   - check blood pressure daily - write blood pressure results in a log or diary    Why is this important?   You won't feel high blood pressure, but it can still hurt your blood vessels.  High blood pressure can cause heart or kidney problems. It can also cause a stroke.  Making lifestyle changes like losing a little weight or eating less salt will help.  Checking your blood pressure at home and at different times of the day can help to control blood pressure.  If the doctor prescribes medicine remember to take it the way the doctor ordered.  Call the office if you cannot afford the medicine or if there are questions about it.     Notes:

## 2020-08-29 DIAGNOSIS — I10 Essential (primary) hypertension: Secondary | ICD-10-CM | POA: Diagnosis not present

## 2020-08-29 DIAGNOSIS — M1991 Primary osteoarthritis, unspecified site: Secondary | ICD-10-CM | POA: Diagnosis not present

## 2020-08-29 DIAGNOSIS — G894 Chronic pain syndrome: Secondary | ICD-10-CM | POA: Diagnosis not present

## 2020-09-04 DIAGNOSIS — R278 Other lack of coordination: Secondary | ICD-10-CM | POA: Diagnosis not present

## 2020-09-04 DIAGNOSIS — R296 Repeated falls: Secondary | ICD-10-CM | POA: Diagnosis not present

## 2020-09-04 DIAGNOSIS — G629 Polyneuropathy, unspecified: Secondary | ICD-10-CM | POA: Diagnosis not present

## 2020-09-04 DIAGNOSIS — I951 Orthostatic hypotension: Secondary | ICD-10-CM | POA: Diagnosis not present

## 2020-09-04 DIAGNOSIS — R269 Unspecified abnormalities of gait and mobility: Secondary | ICD-10-CM | POA: Diagnosis not present

## 2020-09-09 DIAGNOSIS — E1140 Type 2 diabetes mellitus with diabetic neuropathy, unspecified: Secondary | ICD-10-CM | POA: Diagnosis not present

## 2020-09-09 DIAGNOSIS — I10 Essential (primary) hypertension: Secondary | ICD-10-CM | POA: Diagnosis not present

## 2020-09-09 DIAGNOSIS — R0789 Other chest pain: Secondary | ICD-10-CM | POA: Diagnosis not present

## 2020-09-09 DIAGNOSIS — K219 Gastro-esophageal reflux disease without esophagitis: Secondary | ICD-10-CM | POA: Diagnosis not present

## 2020-09-14 DIAGNOSIS — M1711 Unilateral primary osteoarthritis, right knee: Secondary | ICD-10-CM | POA: Diagnosis not present

## 2020-09-28 DIAGNOSIS — M5432 Sciatica, left side: Secondary | ICD-10-CM | POA: Diagnosis not present

## 2020-09-29 DIAGNOSIS — I10 Essential (primary) hypertension: Secondary | ICD-10-CM | POA: Diagnosis not present

## 2020-09-29 DIAGNOSIS — M1991 Primary osteoarthritis, unspecified site: Secondary | ICD-10-CM | POA: Diagnosis not present

## 2020-09-29 DIAGNOSIS — G894 Chronic pain syndrome: Secondary | ICD-10-CM | POA: Diagnosis not present

## 2020-10-08 DIAGNOSIS — I10 Essential (primary) hypertension: Secondary | ICD-10-CM | POA: Diagnosis not present

## 2020-10-08 DIAGNOSIS — G894 Chronic pain syndrome: Secondary | ICD-10-CM | POA: Diagnosis not present

## 2020-10-08 DIAGNOSIS — G47411 Narcolepsy with cataplexy: Secondary | ICD-10-CM | POA: Diagnosis not present

## 2020-10-08 DIAGNOSIS — E1140 Type 2 diabetes mellitus with diabetic neuropathy, unspecified: Secondary | ICD-10-CM | POA: Diagnosis not present

## 2020-10-08 DIAGNOSIS — K219 Gastro-esophageal reflux disease without esophagitis: Secondary | ICD-10-CM | POA: Diagnosis not present

## 2020-10-26 ENCOUNTER — Emergency Department (HOSPITAL_BASED_OUTPATIENT_CLINIC_OR_DEPARTMENT_OTHER)
Admission: EM | Admit: 2020-10-26 | Discharge: 2020-10-26 | Disposition: A | Discharge status: Home / Self Care | Payer: Medicare Other

## 2020-10-26 ENCOUNTER — Other Ambulatory Visit: Payer: Self-pay

## 2020-10-26 DIAGNOSIS — M545 Low back pain, unspecified: Secondary | ICD-10-CM | POA: Diagnosis not present

## 2020-10-26 DIAGNOSIS — M7062 Trochanteric bursitis, left hip: Secondary | ICD-10-CM | POA: Diagnosis not present

## 2020-10-27 DIAGNOSIS — I10 Essential (primary) hypertension: Secondary | ICD-10-CM | POA: Diagnosis not present

## 2020-10-27 DIAGNOSIS — E119 Type 2 diabetes mellitus without complications: Secondary | ICD-10-CM | POA: Diagnosis not present

## 2020-10-27 DIAGNOSIS — M47816 Spondylosis without myelopathy or radiculopathy, lumbar region: Secondary | ICD-10-CM | POA: Diagnosis not present

## 2020-10-27 DIAGNOSIS — M1991 Primary osteoarthritis, unspecified site: Secondary | ICD-10-CM | POA: Diagnosis not present

## 2020-10-27 DIAGNOSIS — G894 Chronic pain syndrome: Secondary | ICD-10-CM | POA: Diagnosis not present

## 2020-10-30 DIAGNOSIS — G894 Chronic pain syndrome: Secondary | ICD-10-CM | POA: Diagnosis not present

## 2020-10-30 DIAGNOSIS — I10 Essential (primary) hypertension: Secondary | ICD-10-CM | POA: Diagnosis not present

## 2020-10-30 DIAGNOSIS — M1991 Primary osteoarthritis, unspecified site: Secondary | ICD-10-CM | POA: Diagnosis not present

## 2020-11-02 DIAGNOSIS — M5459 Other low back pain: Secondary | ICD-10-CM | POA: Diagnosis not present

## 2020-11-02 DIAGNOSIS — M5416 Radiculopathy, lumbar region: Secondary | ICD-10-CM | POA: Diagnosis not present

## 2020-11-10 ENCOUNTER — Other Ambulatory Visit: Payer: Self-pay | Admitting: *Deleted

## 2020-11-10 DIAGNOSIS — Z0000 Encounter for general adult medical examination without abnormal findings: Secondary | ICD-10-CM | POA: Diagnosis not present

## 2020-11-10 DIAGNOSIS — I10 Essential (primary) hypertension: Secondary | ICD-10-CM | POA: Diagnosis not present

## 2020-11-10 DIAGNOSIS — G8929 Other chronic pain: Secondary | ICD-10-CM | POA: Diagnosis not present

## 2020-11-10 DIAGNOSIS — E039 Hypothyroidism, unspecified: Secondary | ICD-10-CM | POA: Diagnosis not present

## 2020-11-10 DIAGNOSIS — Z1389 Encounter for screening for other disorder: Secondary | ICD-10-CM | POA: Diagnosis not present

## 2020-11-10 DIAGNOSIS — B370 Candidal stomatitis: Secondary | ICD-10-CM | POA: Diagnosis not present

## 2020-11-10 DIAGNOSIS — E7849 Other hyperlipidemia: Secondary | ICD-10-CM | POA: Diagnosis not present

## 2020-11-10 DIAGNOSIS — K140 Glossitis: Secondary | ICD-10-CM | POA: Diagnosis not present

## 2020-11-10 NOTE — Patient Outreach (Signed)
[facility]) Care Management  11/10/2020  [name] 10-Jun-1946 [id number]   RN Health Coach attempted follow up outreach call to patient.  Patient was unavailable. HIPPA compliance voicemail message left with return callback number.  Plan: RN will call patient again within 30 days.  [place] Care Management [phone]

## 2020-11-24 DIAGNOSIS — M5459 Other low back pain: Secondary | ICD-10-CM | POA: Diagnosis not present

## 2020-11-27 DIAGNOSIS — M25561 Pain in right knee: Secondary | ICD-10-CM | POA: Diagnosis not present

## 2020-11-28 DIAGNOSIS — I10 Essential (primary) hypertension: Secondary | ICD-10-CM | POA: Diagnosis not present

## 2020-11-28 DIAGNOSIS — M1991 Primary osteoarthritis, unspecified site: Secondary | ICD-10-CM | POA: Diagnosis not present

## 2020-11-28 DIAGNOSIS — G894 Chronic pain syndrome: Secondary | ICD-10-CM | POA: Diagnosis not present

## 2020-12-01 DIAGNOSIS — M5416 Radiculopathy, lumbar region: Secondary | ICD-10-CM | POA: Diagnosis not present

## 2020-12-01 DIAGNOSIS — M5459 Other low back pain: Secondary | ICD-10-CM | POA: Diagnosis not present

## 2020-12-01 DIAGNOSIS — M25561 Pain in right knee: Secondary | ICD-10-CM | POA: Diagnosis not present

## 2020-12-04 DIAGNOSIS — M238X1 Other internal derangements of right knee: Secondary | ICD-10-CM | POA: Diagnosis not present

## 2020-12-08 DIAGNOSIS — M8448XA Pathological fracture, other site, initial encounter for fracture: Secondary | ICD-10-CM | POA: Diagnosis not present

## 2020-12-08 DIAGNOSIS — I10 Essential (primary) hypertension: Secondary | ICD-10-CM | POA: Diagnosis not present

## 2020-12-10 DIAGNOSIS — M1991 Primary osteoarthritis, unspecified site: Secondary | ICD-10-CM | POA: Diagnosis not present

## 2020-12-10 DIAGNOSIS — G894 Chronic pain syndrome: Secondary | ICD-10-CM | POA: Diagnosis not present

## 2020-12-10 DIAGNOSIS — E119 Type 2 diabetes mellitus without complications: Secondary | ICD-10-CM | POA: Diagnosis not present

## 2020-12-10 DIAGNOSIS — I10 Essential (primary) hypertension: Secondary | ICD-10-CM | POA: Diagnosis not present

## 2020-12-10 DIAGNOSIS — M47816 Spondylosis without myelopathy or radiculopathy, lumbar region: Secondary | ICD-10-CM | POA: Diagnosis not present

## 2020-12-10 DIAGNOSIS — M5136 Other intervertebral disc degeneration, lumbar region: Secondary | ICD-10-CM | POA: Diagnosis not present

## 2020-12-10 DIAGNOSIS — M48061 Spinal stenosis, lumbar region without neurogenic claudication: Secondary | ICD-10-CM | POA: Diagnosis not present

## 2020-12-14 ENCOUNTER — Other Ambulatory Visit: Payer: Self-pay | Admitting: *Deleted

## 2020-12-15 NOTE — Patient Outreach (Signed)
[facility]) Care Management  [place]  12/15/2020   [name] Oct 05, 1946 [id number]  RN Health Coach telephone call to patient.  Hipaa compliance verified. Per patient she has not been checking her blood pressure recently. She stated that she has been in pain and that she hardly got out of the bed. Per patient she used a wheelchair in the home to get around with. Her back hurts spasms when she stands or more. Patient is awaiting a cortisone shot for her back. Per patient she has been taking her medications as per ordered. [facility] had not ordered herr free Covid testing kits. Patient has agreed to follow up outreach calls.  Encounter Medications:  Outpatient Encounter Medications as of 12/14/2020  Medication Sig Note  . acyclovir (ZOVIRAX) 400 MG tablet Take 400 mg by mouth 2 (two) times daily.    Marland Kitchen amLODipine (NORVASC) 10 MG tablet Take 1 tablet (10 mg total) by mouth daily.   Marland Kitchen aspirin EC 81 MG tablet Take 81 mg by mouth daily.   Marland Kitchen atorvastatin (LIPITOR) 40 MG tablet Take 40 mg by mouth daily.   . B Complex-C (B-COMPLEX WITH VITAMIN C) tablet Take 1 tablet by mouth daily.   . Biotin 5 MG CAPS Take 1 capsule by mouth daily.   . celecoxib (CELEBREX) 200 MG capsule Take 1 capsule by mouth daily. (Patient not taking: Reported on 04/29/2020)   . DULoxetine (CYMBALTA) 30 MG capsule Take 1 capsule by mouth daily.   Marland Kitchen EPINEPHrine 0.3 mg/0.3 mL IJ SOAJ injection Use as needed   . fluticasone (FLONASE) 50 MCG/ACT nasal spray Place 2 sprays into both nostrils as needed for allergies.    . furosemide (LASIX) 20 MG tablet Take 20-40 mg by mouth 2 (two) times daily. Take 40 mg in the morning and 20 mg in the evening 10/03/2019: Takes every other day   . hydrochlorothiazide (MICROZIDE) 12.5 MG capsule TAKE 1 CAPSULE BY MOUTH  DAILY   . HYDROcodone-acetaminophen (NORCO) 10-325 MG tablet Take 1 tablet by mouth every 4 (four) hours.   Marland Kitchen lisinopril (PRINIVIL,ZESTRIL) 40 MG  tablet Take 40 mg by mouth daily.   . Magnesium 250 MG TABS Take 1 tablet by mouth daily. (Patient not taking: Reported on 04/29/2020)   . methocarbamol (ROBAXIN) 500 MG tablet Take 1 tablet by mouth every 8 (eight) hours as needed. (Patient not taking: Reported on 04/29/2020)   . Multiple Vitamins-Minerals (PRESERVISION AREDS 2 PO) Take 2 tablets by mouth daily.    Marland Kitchen MYRBETRIQ 50 MG TB24 tablet Take 50 mg by mouth daily.   . Potassium Citrate 99 MG CAPS Take 1 capsule by mouth daily. (Patient not taking: Reported on 04/29/2020) 11/18/2019: PRN  . pyridostigmine (MESTINON) 60 MG tablet Take 60 mg by mouth 3 (three) times daily.   Marland Kitchen pyridOXINE (VITAMIN B-6) 100 MG tablet Take 100 mg by mouth daily.   . traMADol (ULTRAM) 50 MG tablet TAKE 1 2 TABLETS BY MOUTH 4 TIMES A DAY AS NEEDED PAIN, MAY ADD TO HYDROCODONE IF NEEDED (Patient not taking: Reported on 04/29/2020)   . traZODone (DESYREL) 150 MG tablet Take 150 mg by mouth at bedtime.   . vitamin B-12 (CYANOCOBALAMIN) 1000 MCG tablet Take 2,500 mcg by mouth daily.   . Vitamins-Lipotropics (EAR HEALTH PLUS PO) Take 1 tablet by mouth daily.    No facility-administered encounter medications on file as of 12/14/2020.    Functional Status:  In your present state of health, do  you have any difficulty performing the following activities: 05/29/2020  Hearing? N  Vision? N  Difficulty concentrating or making decisions? N  Walking or climbing stairs? Y  Comment nedsy=to use cane due to flare ups of bursitus  Dressing or bathing? N  Doing errands, shopping? Y  Comment husband assists but he has been sick lately  Conservation officer, nature and eating ? N  Using the Toilet? N  In the past six months, have you accidently leaked urine? Y  Do you have problems with loss of bowel control? N  Managing your Medications? N  Managing your Finances? N  Housekeeping or managing your Housekeeping? N  Some recent data might be hidden    Fall/Depression Screening: Fall Risk   12/14/2020 08/27/2020 05/29/2020  Falls in the past year? _0 Number falls in past yr: _1 Injury with Fall? _2 Risk for fall due to : History of fall(s);Impaired balance/gait;Impaired mobility History of fall(s);Impaired balance/gait;Impaired mobility History of fall(s);Impaired balance/gait;Impaired mobility  Follow up Falls evaluation completed Falls evaluation completed Falls evaluation completed;Education provided;Falls prevention discussed   PHQ 2/9 Scores 10/02/2019 03/05/2014  PHQ - 2 Score 1 2    Assessment:  Goals Addressed            This Visit's Progress   . (THN)Lifestyle Change-Hypertension       Timeframe:  Long-Range Goal Priority:  High Start Date:        [id number]                     Expected End Date:      12751700                 Follow Up Date [id number]   - agree to work together to make changes - ask questions to understand - learn about high blood pressure    Why is this important?    The changes that you are asked to make may be hard to do.   This is especially true when the changes are life-long.   Knowing why it is important to you is the first step.   Working on the change with your family or support person helps you not feel alone.   Reward yourself and family or support person when goals are met. This can be an activity you choose like bowling, hiking, biking, swimming or shooting hoops.     Notes:     . [facility] and Keep All Appointments   On track    Timeframe:  Long-Range Goal Priority:  Medium Start Date:      75916384                       Expected End Date:        [id number]              Follow Up Date 01779390   - call to cancel if needed - keep a calendar with prescription refill dates - keep a calendar with appointment dates    Why is this important?   Part of staying healthy is seeing the doctor for follow-up care.  If you forget your appointments, there are some things you can do to stay on track.    Notes:     .  (THN)Track and Manage My Blood Pressure   Not on track    Timeframe:  Long-Range Goal Priority:  High Start Date:  63016010                         Expected End Date:      93235573                Follow Up Date [id number] - check blood pressure daily - write blood pressure results in a log or diary    Why is this important?   You won't feel high blood pressure, but it can still hurt your blood vessels.  High blood pressure can cause heart or kidney problems. It can also cause a stroke.  Making lifestyle changes like losing a little weight or eating less salt will help.  Checking your blood pressure at home and at different times of the day can help to control blood pressure.  If the doctor prescribes medicine remember to take it the way the doctor ordered.  Call the office if you cannot afford the medicine or if there are questions about it.     Notes:        Plan:  Follow-up:  Patient agrees to Care Plan and Follow-up.  RN assisted patient in ordering Covid testing kits RN will follow up within the month of March Patient is monitoring blood pressure intermittently Provider a calendar book  [place] Management [phone]

## 2020-12-15 NOTE — Patient Instructions (Signed)
Goals Addressed            This Visit's Progress   . (THN)Lifestyle Change-Hypertension       Timeframe:  Long-Range Goal Priority:  High Start Date:        [id number]                     Expected End Date:      41638453                 Follow Up Date 64680321   - agree to work together to make changes - ask questions to understand - learn about high blood pressure    Why is this important?    The changes that you are asked to make may be hard to do.   This is especially true when the changes are life-long.   Knowing why it is important to you is the first step.   Working on the change with your family or support person helps you not feel alone.   Reward yourself and family or support person when goals are met. This can be an activity you choose like bowling, hiking, biking, swimming or shooting hoops.     Notes:     . [facility] and Keep All Appointments   On track    Timeframe:  Long-Range Goal Priority:  Medium Start Date:      22482500                       Expected End Date:        37048889              Follow Up Date 16945038   - call to cancel if needed - keep a calendar with prescription refill dates - keep a calendar with appointment dates    Why is this important?   Part of staying healthy is seeing the doctor for follow-up care.  If you forget your appointments, there are some things you can do to stay on track.    Notes:     . (THN)Track and Manage My Blood Pressure   Not on track    Timeframe:  Long-Range Goal Priority:  High Start Date:    88280034                         Expected End Date:      91791505                Follow Up Date 69794801 - check blood pressure daily - write blood pressure results in a log or diary    Why is this important?   You won't feel high blood pressure, but it can still hurt your blood vessels.  High blood pressure can cause heart or kidney problems. It can also cause a stroke.  Making lifestyle changes like losing a  little weight or eating less salt will help.  Checking your blood pressure at home and at different times of the day can help to control blood pressure.  If the doctor prescribes medicine remember to take it the way the doctor ordered.  Call the office if you cannot afford the medicine or if there are questions about it.     Notes:

## 2020-12-17 DIAGNOSIS — Z03818 Encounter for observation for suspected exposure to other biological agents ruled out: Secondary | ICD-10-CM | POA: Diagnosis not present

## 2020-12-17 DIAGNOSIS — Z20822 Contact with and (suspected) exposure to covid-19: Secondary | ICD-10-CM | POA: Diagnosis not present

## 2020-12-18 ENCOUNTER — Telehealth (HOSPITAL_COMMUNITY): Payer: Self-pay

## 2020-12-18 ENCOUNTER — Other Ambulatory Visit (HOSPITAL_COMMUNITY): Payer: Self-pay | Admitting: Interventional Radiology

## 2020-12-18 NOTE — Telephone Encounter (Signed)
Returned pt's call, no answer, left vm. AW  

## 2020-12-22 DIAGNOSIS — M8448XA Pathological fracture, other site, initial encounter for fracture: Secondary | ICD-10-CM | POA: Diagnosis not present

## 2020-12-22 DIAGNOSIS — I10 Essential (primary) hypertension: Secondary | ICD-10-CM | POA: Diagnosis not present

## 2020-12-25 DIAGNOSIS — M25561 Pain in right knee: Secondary | ICD-10-CM | POA: Diagnosis not present

## 2020-12-25 NOTE — Progress Notes (Deleted)
Cardiology Clinic Note   Patient Name: [name] Date of Encounter: 12/25/2020  Primary Care Provider:  Redmond School, MD Primary Cardiologist:  Carolyn Burow, MD  Patient Profile    [name] 75 year old female presents today for follow-up of her hypertension, atypical chest pain, and hyperlipidemia.  Past Medical History    Past Medical History:  Diagnosis Date  . Anginal pain ([name])   . Atypical chest pain   . Depression   . Hyperlipidemia   . Hypertension   . Kidney tumor   . Obesity    Past Surgical History:  Procedure Laterality Date  . ABDOMINAL HYSTERECTOMY    . CARDIAC CATHETERIZATION  2009   normal coronary arteries, preserved LVEF 60%  . CHOLECYSTECTOMY  2006   Dr. Hulen Leonard.  . COLONOSCOPY  2004   Dr. Collene Leonard: hemorrhoids, diverticulosis, some stool present and small lesions could have been missed. repeat colonoscopy in five years.   . COLONOSCOPY  2016   Dr. Collene Leonard: unremarkable per patient  . EMBOLIZATION    . HERNIA REPAIR    . TONSILLECTOMY      Allergies  Allergies  Allergen Reactions  . Bee Venom Anaphylaxis    History of Present Illness    [name] has a past medical history of chest pain, hyperlipidemia, hypertension, cardiac catheterization x2 remotely by Dr. Dennison Leonard that showed normal coronaries.  She has a history of anxiety with panic attacks.  Her Myoview stress test from 5/17 was negative.   She was last seen by Dr. Gwenlyn Leonard on 03/15/2019.  During that time she was doing reasonably well.  She had fallen down and broken several ribs, she was describing some positional chest pain related to her ribs.  She also had some gastrointestinal issues which were evaluated in [place] by Dr. Collene Leonard.  She denied shortness of breath.  Her blood pressure was elevated at that time.  200/80 she was started on HCTZ 12.5 and sent to the hypertension clinic for further evaluation.  On 05/22/2019 blood pressure was 133/52.  She presented to the office  10/24/19 and stated she was having pain due to a fall in her yard yesterday.  She stated she continued to be very physically active managing her 12 acre property.  She routinely push mowed, raked, cleared branches.  She had been monitoring her blood pressure at home and noted readings of 130s over 70s.  It was slightly elevated during the visit however, she was having severe pain and was tearful in the office.  She also noticed chest discomfort but stated that she lifted several heavy branches yesterday while clearing her property.  She was last seen by Dr. Gwenlyn Leonard on 03/11/2020.  During that time she had fallen and broken several  ribs.  She was noted to have some positional chest pain that was related.  She also had received her [place] vaccination and was seen in the emergency room on 02/19/2020.  She reported symptoms of shortness of breath.  A chest CT showed no pulmonary emboli or dissection.  She was noted to have a 38 mm ascending thoracic aortic aneurysm.  She presents the clinic today for follow-up evaluation states***  Today she denies chest pain, shortness of breath, lower extremity edema, fatigue, palpitations, melena, hematuria, hemoptysis, diaphoresis, weakness, presyncope, syncope, orthopnea, and PND.  Home Medications    Prior to Admission medications   Medication Sig Start Date End Date Taking? Authorizing Provider  acyclovir (ZOVIRAX) 400 MG tablet Take 400 mg  by mouth 2 (two) times daily.     [provider]  amLODipine (NORVASC) 10 MG tablet Take 1 tablet (10 mg total) by mouth daily. 03/11/20   Carolyn Harp, MD  aspirin EC 81 MG tablet Take 81 mg by mouth daily.    [provider]  atorvastatin (LIPITOR) 40 MG tablet Take 40 mg by mouth daily. 08/15/19   [provider]  B Complex-C (B-COMPLEX WITH VITAMIN C) tablet Take 1 tablet by mouth daily.    [provider]  Biotin 5 MG CAPS Take 1 capsule by mouth daily.    [provider]  celecoxib (CELEBREX) 200 MG capsule Take 1 capsule by mouth daily. Patient not taking: Reported on 04/29/2020    [provider]  DULoxetine (CYMBALTA) 30 MG capsule Take 1 capsule by mouth daily.    [provider]  EPINEPHrine 0.3 mg/0.3 mL IJ SOAJ injection Use as needed    [provider]  fluticasone (FLONASE) 50 MCG/ACT nasal spray Place 2 sprays into both nostrils as needed for allergies.     [provider]  furosemide (LASIX) 20 MG tablet Take 20-40 mg by mouth 2 (two) times daily. Take 40 mg in the morning and 20 mg in the evening    [provider]  hydrochlorothiazide (MICROZIDE) 12.5 MG capsule TAKE 1 CAPSULE BY MOUTH  DAILY 11/20/19   Carolyn Harp, MD  HYDROcodone-acetaminophen (NORCO) 10-325 MG tablet Take 1 tablet by mouth every 4 (four) hours.    [provider]  lisinopril (PRINIVIL,ZESTRIL) 40 MG tablet Take 40 mg by mouth daily.    [provider]  Magnesium 250 MG TABS Take 1 tablet by mouth daily. Patient not taking: Reported on 04/29/2020    [provider]  methocarbamol (ROBAXIN) 500 MG tablet Take 1 tablet by mouth every 8 (eight) hours as needed. Patient not taking: Reported on 04/29/2020    [provider]  Multiple Vitamins-Minerals (PRESERVISION AREDS 2 PO) Take 2 tablets by mouth daily.     [provider]  MYRBETRIQ 50 MG TB24 tablet Take 50 mg by mouth daily. 09/13/19   [provider]  Potassium Citrate 99 MG CAPS Take 1 capsule by mouth daily. Patient not taking: Reported on 04/29/2020    [provider]  pyridostigmine (MESTINON) 60 MG tablet Take 60 mg by mouth 3 (three) times daily.    [provider]  pyridOXINE (VITAMIN B-6) 100 MG tablet Take 100 mg by mouth daily.    [provider]  traMADol (ULTRAM) 50 MG tablet TAKE 1 2 TABLETS BY MOUTH 4 TIMES A DAY AS NEEDED PAIN, MAY ADD TO HYDROCODONE IF NEEDED Patient not  taking: Reported on 04/29/2020 11/05/19   [provider]  traZODone (DESYREL) 150 MG tablet Take 150 mg by mouth at bedtime. 10/30/15   [provider]  vitamin B-12 (CYANOCOBALAMIN) 1000 MCG tablet Take 2,500 mcg by mouth daily.    [provider]  Vitamins-Lipotropics (EAR HEALTH PLUS PO) Take 1 tablet by mouth daily.    [provider]    Family History    Family History  Problem Relation Age of Onset  . Diabetes Mother   . Hypertension Mother   . Colon cancer Brother        deceased age 32  . Stroke Father   . Cancer Sister        breast  . Breast cancer Sister   . Other Son  chron's   . Breast cancer Maternal Grandmother    She indicated that her mother is deceased. She indicated that her father is deceased. She indicated that both of her sisters are alive. She indicated that her brother is deceased. She indicated that the status of her maternal grandmother is unknown. She indicated that both of her sons are alive. She indicated that her other is alive.  Social History    Social History   Socioeconomic History  . Marital status: Married    Spouse name: Not on file  . Number of children: Not on file  . Years of education: Not on file  . Highest education level: Not on file  Occupational History  . Not on file  Tobacco Use  . Smoking status: Never Smoker  . Smokeless tobacco: Never Used  Vaping Use  . Vaping Use: Never used  Substance and Sexual Activity  . Alcohol use: No  . Drug use: No  . Sexual activity: Yes    Birth control/protection: Post-menopausal, Surgical  Other Topics Concern  . 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    Social Determinants of Health   Financial Resource Strain: Not on file  Food Insecurity: No Food Insecurity  .  Worried About Charity fundraiser in the Last Year: Never true  . Ran Out of Food in the Last Year: Never true  Transportation Needs: No Transportation Needs  . Lack of Transportation (Medical): No  . Lack of Transportation (Non-Medical): No  Physical Activity: Not on file  Stress: Not on file  Social Connections: Not on file  Intimate Partner Violence: Not on file     Review of Systems    General:  No chills, fever, night sweats or weight changes.  Cardiovascular:  No chest pain, dyspnea on exertion, edema, orthopnea, palpitations, paroxysmal nocturnal dyspnea. Dermatological: No rash, lesions/masses Respiratory: No cough, dyspnea Urologic: No hematuria, dysuria Abdominal:   No nausea, vomiting, diarrhea, bright red blood per rectum, melena, or hematemesis Neurologic:  No visual changes, wkns, changes in mental status. All other systems reviewed and are otherwise negative except as noted above.  Physical Exam    VS:  There were no vitals taken for this visit. , BMI There is no height or weight on file to calculate BMI. GEN: Well nourished, well developed, in no acute distress. HEENT: normal. Neck: Supple, no JVD, carotid bruits, or masses. Cardiac: RRR, no murmurs, rubs, or gallops. No clubbing, cyanosis, edema.  Radials/DP/PT 2+ and equal bilaterally.  Respiratory:  Respirations regular and unlabored, clear to auscultation bilaterally. GI: Soft, nontender, nondistended, BS + x 4. MS: no deformity or atrophy. Skin: warm and dry, no rash. Neuro:  Strength and sensation are intact. Psych: Normal affect.  Accessory Clinical Findings    Recent Labs: 02/19/2020: ALT 20; BUN 32; Creatinine, Ser 1.41; Hemoglobin 14.3; Platelets 266; Potassium 3.1; Sodium 141   Recent Lipid Panel    Component Value Date/Time   CHOL 161 09/20/2018 0926   TRIG 72 09/20/2018 0926   HDL 55 09/20/2018 0926   CHOLHDL 2.9 09/20/2018 0926   CHOLHDL 4.7 02/06/2008 1634   VLDL 18 02/06/2008 1634    LDLCALC 92 09/20/2018 0926   LDLDIRECT 89 10/24/2019 1142  ECG personally reviewed by me today- *** - No acute changes  EKG 10/24/2019 normal sinus rhythm 84 bpm- No acute changes  EKG 05/09/2018 Sinus rhythm borderline repolarization abnormality prolonged QT interval 86 bpm  Stress test/myocardial perfusion study 03/16/2016  The left ventricular ejection fraction is mildly decreased (45-54%).  Nuclear stress EF: 48%. No wall motion abnormalities  There was no ST segment deviation noted during stress. PVCs noted in recovery, bigeminy pattern, frequent  Defect 1: There is a small defect of mild severity present in the apex location. No ischemia  This is a low risk study. No ischemia identified.   Assessment & Plan   1.  Essential hypertension-BP today ***155/78.  Better controlled at home 130's over 70's. Left knee pain today from falling in her yard yesterday. Following up with orthopedics today. Continue hydrochlorothiazide 12.5 mg daily Continue amlodipine 10 mg tablet daily Continue aspirin 81 mg tablet daily Continue furosemide 20-40 mg twice daily Continue lisinopril 40 mg tablet daily Heart healthy low-sodium diet-salty 6 given Increase physical activity as tolerated Order BMP BP log given   Hyperlipidemia-LDL 92 09/20/2018 Continue atorvastatin 40 mg tablet daily Heart healthy low-sodium high-fiber diet Increase physical activity as tolerated Order lipid profile  Atypical chest pain/normal coronary arteries- muscular wall  pain.  Yesterday she completed significant manual labor lifting heavy branches on he [place] acre property.  Discomfort reproduced with palpation.  She has had a history of multiple normal cardiac catheterizations performed by Dr. Erskine Emery.  Her Myoview stress test 02/2016 was also negative. Continue to monitor.  Disposition: Follow-up with Dr. Gwenlyn Leonard in 6 months.   Jossie Ng. Vadie Principato NP-C    12/25/2020, 7:04 AM [place] [address] Office [phone] Fax [phone]  Notice: This dictation was prepared with Dragon dictation along with smaller phrase technology. Any transcriptional errors that result from this process are unintentional and may not be corrected upon review.  I spent***minutes examining this patient, reviewing medications, and using patient centered shared decision making involving her cardiac care.  Prior to her visit I spent greater than 20 minutes reviewing her past medical history,  medications, and prior cardiac tests.

## 2020-12-28 ENCOUNTER — Other Ambulatory Visit (HOSPITAL_COMMUNITY): Payer: Self-pay | Admitting: Interventional Radiology

## 2020-12-28 ENCOUNTER — Ambulatory Visit: Payer: Medicare Other | Admitting: General Practice

## 2020-12-28 DIAGNOSIS — M8448XA Pathological fracture, other site, initial encounter for fracture: Secondary | ICD-10-CM

## 2020-12-30 ENCOUNTER — Other Ambulatory Visit: Payer: Self-pay

## 2020-12-30 ENCOUNTER — Ambulatory Visit (HOSPITAL_COMMUNITY)
Admission: RE | Admit: 2020-12-30 | Discharge: 2020-12-30 | Disposition: A | Discharge status: Home / Self Care | Payer: Medicare Other | Source: Ambulatory Visit | Attending: Interventional Radiology | Admitting: Interventional Radiology

## 2020-12-30 DIAGNOSIS — M8448XA Pathological fracture, other site, initial encounter for fracture: Secondary | ICD-10-CM

## 2020-12-30 DIAGNOSIS — S3210XA Unspecified fracture of sacrum, initial encounter for closed fracture: Secondary | ICD-10-CM | POA: Diagnosis not present

## 2020-12-31 DIAGNOSIS — M5416 Radiculopathy, lumbar region: Secondary | ICD-10-CM | POA: Diagnosis not present

## 2020-12-31 DIAGNOSIS — M1711 Unilateral primary osteoarthritis, right knee: Secondary | ICD-10-CM | POA: Diagnosis not present

## 2020-12-31 HISTORY — PX: IR RADIOLOGIST EVAL & MGMT: IMG5224

## 2021-01-08 ENCOUNTER — Telehealth (HOSPITAL_COMMUNITY): Payer: Self-pay | Admitting: Radiology

## 2021-01-08 NOTE — Telephone Encounter (Signed)
Pt called wanting an update on the status of her sacroplasty being scheduled. I informed her that we are still awaiting insurance authorization. Sent an email to [place] on 01/08/21 to check on the status. JM

## 2021-01-11 DIAGNOSIS — G894 Chronic pain syndrome: Secondary | ICD-10-CM | POA: Diagnosis not present

## 2021-01-11 DIAGNOSIS — E1140 Type 2 diabetes mellitus with diabetic neuropathy, unspecified: Secondary | ICD-10-CM | POA: Diagnosis not present

## 2021-01-11 DIAGNOSIS — I10 Essential (primary) hypertension: Secondary | ICD-10-CM | POA: Diagnosis not present

## 2021-01-26 ENCOUNTER — Other Ambulatory Visit: Payer: Self-pay | Admitting: *Deleted

## 2021-01-26 NOTE — Patient Outreach (Signed)
[facility]) Care Management  01/26/2021  DANNETTA LEKAS 30-Dec-1945 [id number]   RN Health Coach attempted follow up outreach call to patient.  Patient was unavailable. HIPPA compliance voicemail message left with return callback number.  Plan: RN will call patient again within 30 days.  [place] Care Management [phone]

## 2021-01-27 DIAGNOSIS — I10 Essential (primary) hypertension: Secondary | ICD-10-CM | POA: Diagnosis not present

## 2021-01-27 DIAGNOSIS — Z0000 Encounter for general adult medical examination without abnormal findings: Secondary | ICD-10-CM | POA: Diagnosis not present

## 2021-01-27 DIAGNOSIS — E7849 Other hyperlipidemia: Secondary | ICD-10-CM | POA: Diagnosis not present

## 2021-01-27 DIAGNOSIS — E538 Deficiency of other specified B group vitamins: Secondary | ICD-10-CM | POA: Diagnosis not present

## 2021-01-27 DIAGNOSIS — K121 Other forms of stomatitis: Secondary | ICD-10-CM | POA: Diagnosis not present

## 2021-02-05 DIAGNOSIS — M8438XA Stress fracture, other site, initial encounter for fracture: Secondary | ICD-10-CM | POA: Diagnosis not present

## 2021-02-05 DIAGNOSIS — M5459 Other low back pain: Secondary | ICD-10-CM | POA: Diagnosis not present

## 2021-02-08 ENCOUNTER — Other Ambulatory Visit: Payer: Self-pay | Admitting: Internal Medicine

## 2021-02-08 ENCOUNTER — Other Ambulatory Visit (HOSPITAL_COMMUNITY): Payer: Self-pay | Admitting: Internal Medicine

## 2021-02-08 ENCOUNTER — Ambulatory Visit (HOSPITAL_COMMUNITY)
Admission: RE | Admit: 2021-02-08 | Discharge: 2021-02-08 | Disposition: A | Discharge status: Home / Self Care | Payer: Medicare Other | Source: Ambulatory Visit | Attending: Internal Medicine | Admitting: Internal Medicine

## 2021-02-08 DIAGNOSIS — R319 Hematuria, unspecified: Secondary | ICD-10-CM | POA: Diagnosis not present

## 2021-02-08 DIAGNOSIS — E1140 Type 2 diabetes mellitus with diabetic neuropathy, unspecified: Secondary | ICD-10-CM | POA: Diagnosis not present

## 2021-02-08 DIAGNOSIS — R109 Unspecified abdominal pain: Secondary | ICD-10-CM | POA: Diagnosis not present

## 2021-02-08 LAB — POCT I-STAT CREATININE: Creatinine, Ser: 0.9 mg/dL (ref 0.44–1.00)

## 2021-02-08 MED ORDER — IOHEXOL 300 MG/ML  SOLN
100.0000 mL | Freq: Once | INTRAMUSCULAR | Status: AC | PRN
  Administered 2021-02-08: 100 mL via INTRAVENOUS

## 2021-02-26 ENCOUNTER — Other Ambulatory Visit: Payer: Self-pay | Admitting: *Deleted

## 2021-03-01 ENCOUNTER — Encounter: Payer: Self-pay | Admitting: *Deleted

## 2021-03-03 NOTE — Patient Outreach (Signed)
[facility]) Care Management  [facility] Social Work  [id number] Late entry  [name] 1946-03-19 [id number]  [place] telephone call to patient.  [employer] compliance verified. Per patient she has not been checking her blood pressure at home. Patient stated that she is taking her medications as per ordered. Patient stated that she has been having so much back pain. She stated that her back procedure had been denied. Patient has received her COVID vaccines.  Patient has agreed to follow up outreach calls.    Encounter Medications:  Outpatient Encounter Medications as of 02/26/2021  Medication Sig Note  . acyclovir (ZOVIRAX) 400 MG tablet Take 400 mg by mouth 2 (two) times daily.    Marland Kitchen amLODipine (NORVASC) 10 MG tablet Take 1 tablet (10 mg total) by mouth daily.   Marland Kitchen aspirin EC 81 MG tablet Take 81 mg by mouth daily.   Marland Kitchen atorvastatin (LIPITOR) 40 MG tablet Take 40 mg by mouth daily.   . B Complex-C (B-COMPLEX WITH VITAMIN C) tablet Take 1 tablet by mouth daily.   . Biotin 5 MG CAPS Take 1 capsule by mouth daily.   . celecoxib (CELEBREX) 200 MG capsule Take 1 capsule by mouth daily. (Patient not taking: Reported on 04/29/2020)   . DULoxetine (CYMBALTA) 30 MG capsule Take 1 capsule by mouth daily.   Marland Kitchen EPINEPHrine 0.3 mg/0.3 mL IJ SOAJ injection Use as needed   . fluticasone (FLONASE) 50 MCG/ACT nasal spray Place 2 sprays into both nostrils as needed for allergies.    . furosemide (LASIX) 20 MG tablet Take 20-40 mg by mouth 2 (two) times daily. Take 40 mg in the morning and 20 mg in the evening 10/03/2019: Takes every other day   . hydrochlorothiazide (MICROZIDE) 12.5 MG capsule TAKE 1 CAPSULE BY MOUTH  DAILY   . HYDROcodone-acetaminophen (NORCO) 10-325 MG tablet Take 1 tablet by mouth every 4 (four) hours.   Marland Kitchen lisinopril (PRINIVIL,ZESTRIL) 40 MG tablet Take 40 mg by mouth daily.   . Magnesium 250 MG TABS Take 1 tablet by mouth daily. (Patient not taking: Reported on  04/29/2020)   . methocarbamol (ROBAXIN) 500 MG tablet Take 1 tablet by mouth every 8 (eight) hours as needed. (Patient not taking: Reported on 04/29/2020)   . Multiple Vitamins-Minerals (PRESERVISION AREDS 2 PO) Take 2 tablets by mouth daily.    Marland Kitchen MYRBETRIQ 50 MG TB24 tablet Take 50 mg by mouth daily.   . Potassium Citrate 99 MG CAPS Take 1 capsule by mouth daily. (Patient not taking: Reported on 04/29/2020) 11/18/2019: PRN  . pyridostigmine (MESTINON) 60 MG tablet Take 60 mg by mouth 3 (three) times daily.   Marland Kitchen pyridOXINE (VITAMIN B-6) 100 MG tablet Take 100 mg by mouth daily.   . traMADol (ULTRAM) 50 MG tablet TAKE 1 2 TABLETS BY MOUTH 4 TIMES A DAY AS NEEDED PAIN, MAY ADD TO HYDROCODONE IF NEEDED (Patient not taking: Reported on 04/29/2020)   . traZODone (DESYREL) 150 MG tablet Take 150 mg by mouth at bedtime.   . vitamin B-12 (CYANOCOBALAMIN) 1000 MCG tablet Take 2,500 mcg by mouth daily.   . Vitamins-Lipotropics (EAR HEALTH PLUS PO) Take 1 tablet by mouth daily.    No facility-administered encounter medications on file as of 02/26/2021.    Functional Status:  In your present state of health, do you have any difficulty performing the following activities: 05/29/2020  Hearing? N  Vision? N  Difficulty concentrating or making decisions? N  Walking or climbing  stairs? Y  Comment nedsy=to use cane due to flare ups of bursitus  Dressing or bathing? N  Doing errands, shopping? Y  Comment husband assists but he has been sick lately  Conservation officer, nature and eating ? N  Using the Toilet? N  In the past six months, have you accidently leaked urine? Y  Do you have problems with loss of bowel control? N  Managing your Medications? N  Managing your Finances? N  Housekeeping or managing your Housekeeping? N  Some recent data might be hidden    Fall/Depression Screening:  PHQ 2/9 Scores 10/02/2019 03/05/2014  PHQ - 2 Score 1 2    Assessment:  Goals Addressed            This Visit's Progress   .  (THN)Lifestyle Change-Hypertension   On track    Timeframe:  Long-Range Goal Priority:  High Start Date:        [id number]                     Expected End Date:      [id number]                 Follow Up Date [id number]   - agree to work together to make changes - ask questions to understand - learn about high blood pressure    Why is this important?    The changes that you are asked to make may be hard to do.   This is especially true when the changes are life-long.   Knowing why it is important to you is the first step.   Working on the change with your family or support person helps you not feel alone.   Reward yourself and family or support person when goals are met. This can be an activity you choose like bowling, hiking, biking, swimming or shooting hoops.     Notes:     . [facility] and Keep All Appointments   On track    Timeframe:  Long-Range Goal Priority:  Medium Start Date:      [id number]                       Expected End Date:        26378588     Follow Up Date [id number]   - call to cancel if needed - keep a calendar with prescription refill dates - keep a calendar with appointment dates    Why is this important?   Part of staying healthy is seeing the doctor for follow-up care.  If you forget your appointments, there are some things you can do to stay on track.    Notes:     . (THN)Track and Manage My Blood Pressure       Timeframe:  Long-Range Goal Priority:  High Start Date:    [id number]                         Expected End Date:      [id number]                Follow Up [id number] - check blood pressure daily - write blood pressure results in a log or diary    Why is this important?   You won't feel high blood pressure, but it can still hurt your blood vessels.  High blood pressure can cause heart or kidney problems. It can also cause a stroke.  Making lifestyle changes like losing a little weight or eating less salt will help.  Checking your blood  pressure at home and at different times of the day can help to control blood pressure.  If the doctor prescribes medicine remember to take it the way the doctor ordered.  Call the office if you cannot afford the medicine or if there are questions about it.     Notes:  04/29 Patient had not been monitoring her blood pressure       Plan:  Follow-up:  Patient agrees to Care Plan and Follow-up.  RN ordered free COVID kits for patient RN discussed the importance of monitoring blood pressure RN sent update assessment to PCP RN will follow up within the month of August  Clarrissa Shimkus [place] Management [phone]

## 2021-03-03 NOTE — Patient Instructions (Signed)
Goals Addressed            This Visit's Progress   . (THN)Lifestyle Change-Hypertension   On track    Timeframe:  Long-Range Goal Priority:  High Start Date:        [id number]                     Expected End Date:      94801655                 Follow Up Date [id number]   - agree to work together to make changes - ask questions to understand - learn about high blood pressure    Why is this important?    The changes that you are asked to make may be hard to do.   This is especially true when the changes are life-long.   Knowing why it is important to you is the first step.   Working on the change with your family or support person helps you not feel alone.   Reward yourself and family or support person when goals are met. This can be an activity you choose like bowling, hiking, biking, swimming or shooting hoops.     Notes:     . [facility] and Keep All Appointments   On track    Timeframe:  Long-Range Goal Priority:  Medium Start Date:      [id number]                       Expected End Date:        01007121     Follow Up Date [id number]   - call to cancel if needed - keep a calendar with prescription refill dates - keep a calendar with appointment dates    Why is this important?   Part of staying healthy is seeing the doctor for follow-up care.  If you forget your appointments, there are some things you can do to stay on track.    Notes:     . (THN)Track and Manage My Blood Pressure       Timeframe:  Long-Range Goal Priority:  High Start Date:    [id number]                         Expected End Date:      83094076                Follow Up [id number] - check blood pressure daily - write blood pressure results in a log or diary    Why is this important?   You won't feel high blood pressure, but it can still hurt your blood vessels.  High blood pressure can cause heart or kidney problems. It can also cause a stroke.  Making lifestyle changes like losing a little weight  or eating less salt will help.  Checking your blood pressure at home and at different times of the day can help to control blood pressure.  If the doctor prescribes medicine remember to take it the way the doctor ordered.  Call the office if you cannot afford the medicine or if there are questions about it.     Notes:  04/29 Patient had not been monitoring her blood pressure

## 2021-03-05 DIAGNOSIS — G894 Chronic pain syndrome: Secondary | ICD-10-CM | POA: Diagnosis not present

## 2021-03-10 DIAGNOSIS — M1711 Unilateral primary osteoarthritis, right knee: Secondary | ICD-10-CM | POA: Diagnosis not present

## 2021-04-12 DIAGNOSIS — I10 Essential (primary) hypertension: Secondary | ICD-10-CM | POA: Diagnosis not present

## 2021-04-12 DIAGNOSIS — G894 Chronic pain syndrome: Secondary | ICD-10-CM | POA: Diagnosis not present

## 2021-04-12 DIAGNOSIS — M1991 Primary osteoarthritis, unspecified site: Secondary | ICD-10-CM | POA: Diagnosis not present

## 2021-04-12 DIAGNOSIS — E1140 Type 2 diabetes mellitus with diabetic neuropathy, unspecified: Secondary | ICD-10-CM | POA: Diagnosis not present

## 2021-04-29 DIAGNOSIS — I10 Essential (primary) hypertension: Secondary | ICD-10-CM | POA: Diagnosis not present

## 2021-04-29 DIAGNOSIS — E782 Mixed hyperlipidemia: Secondary | ICD-10-CM | POA: Diagnosis not present

## 2021-05-07 ENCOUNTER — Other Ambulatory Visit: Payer: Self-pay

## 2021-05-07 ENCOUNTER — Encounter: Payer: Self-pay | Admitting: Cardiovascular Disease

## 2021-05-07 ENCOUNTER — Ambulatory Visit: Payer: Medicare Other | Admitting: Cardiovascular Disease

## 2021-05-07 VITALS — BP 110/71 | HR 70 | Ht 68.0 in | Wt 204.8 lb

## 2021-05-07 DIAGNOSIS — E782 Mixed hyperlipidemia: Secondary | ICD-10-CM | POA: Diagnosis not present

## 2021-05-07 DIAGNOSIS — R0789 Other chest pain: Secondary | ICD-10-CM

## 2021-05-07 DIAGNOSIS — I10 Essential (primary) hypertension: Secondary | ICD-10-CM | POA: Diagnosis not present

## 2021-05-07 LAB — LIPID PANEL
Chol/HDL Ratio: 3.9 ratio (ref 0.0–4.4)
Cholesterol, Total: 174 mg/dL (ref 100–199)
HDL: 45 mg/dL (ref >39)
LDL Chol Calc (NIH): 103 mg/dL — ABNORMAL HIGH (ref 0–99)
Triglycerides: 147 mg/dL (ref 0–149)
VLDL Cholesterol Cal: 26 mg/dL (ref 5–40)

## 2021-05-07 LAB — HEPATIC FUNCTION PANEL
ALT: 15 IU/L (ref 0–32)
AST: 17 IU/L (ref 0–40)
Albumin: 4.4 g/dL (ref 3.7–4.7)
Alkaline Phosphatase: 120 IU/L (ref 44–121)
Bilirubin Total: 0.5 mg/dL (ref 0.0–1.2)
Bilirubin, Direct: 0.12 mg/dL (ref 0.00–0.40)
Total Protein: 6.4 g/dL (ref 6.0–8.5)

## 2021-05-07 NOTE — Assessment & Plan Note (Signed)
History of essential hypertension a blood pressure measured today at 110/71.  She is on amlodipine and lisinopril.

## 2021-05-07 NOTE — Assessment & Plan Note (Signed)
History of atypical chest pain which is somewhat positional and also exacerbated by pressing a certain area under her left breast.  She has had 2 cardiac catheterizations done remotely by Dr. Doreatha Lew which were normal.  Her last Myoview performed 03/16/2016 was normal as well.  I do not think that her symptoms sound ischemic in nature.

## 2021-05-07 NOTE — Patient Instructions (Signed)
Medication Instructions:  No Changes In Medications at this time.  *If you need a refill on your cardiac medications before your next appointment, please call your pharmacy*  Lab Work: LIPID/LIVER BLOOD WORK TODAY If you have labs (blood work) drawn today and your tests are completely normal, you will receive your results only by: [place] (if you have MyChart) OR A paper copy in the mail If you have any lab test that is abnormal or we need to change your treatment, we will call you to review the results.  Follow-Up: At [facility], you and your health needs are our priority.  As part of our continuing mission to provide you with exceptional heart care, we have created designated Provider Care Teams.  These Care Teams include your primary Cardiologist (physician) and Advanced Practice Providers (APPs -  Physician Assistants and Nurse Practitioners) who all work together to provide you with the care you need, when you need it.  Your next appointment:   1 year(s)  The format for your next appointment:   In Person  Provider:   Quay Burow, MD

## 2021-05-07 NOTE — Assessment & Plan Note (Signed)
History of hyperlipidemia on atorvastatin 40 mg a day with lipid profile performed 11/10/2020 revealing total cholesterol of 218, LDL of 118 and HDL of 46.  She was not at goal for primary prevention.  We will recheck a lipid and liver profile this morning.
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
## 2021-05-13 ENCOUNTER — Encounter: Payer: Self-pay | Admitting: *Deleted

## 2021-05-24 DIAGNOSIS — Z713 Dietary counseling and surveillance: Secondary | ICD-10-CM | POA: Diagnosis not present

## 2021-05-24 DIAGNOSIS — M1991 Primary osteoarthritis, unspecified site: Secondary | ICD-10-CM | POA: Diagnosis not present

## 2021-05-24 DIAGNOSIS — I10 Essential (primary) hypertension: Secondary | ICD-10-CM | POA: Diagnosis not present

## 2021-05-24 DIAGNOSIS — E119 Type 2 diabetes mellitus without complications: Secondary | ICD-10-CM | POA: Diagnosis not present

## 2021-05-27 DIAGNOSIS — H40033 Anatomical narrow angle, bilateral: Secondary | ICD-10-CM | POA: Diagnosis not present

## 2021-05-27 DIAGNOSIS — E119 Type 2 diabetes mellitus without complications: Secondary | ICD-10-CM | POA: Diagnosis not present

## 2021-06-04 ENCOUNTER — Emergency Department (HOSPITAL_COMMUNITY)
Admission: EM | Admit: 2021-06-04 | Discharge: 2021-06-04 | Disposition: A | Discharge status: Home / Self Care | Payer: Medicare Other | Attending: Emergency Medicine | Admitting: Emergency Medicine

## 2021-06-04 ENCOUNTER — Other Ambulatory Visit: Payer: Self-pay

## 2021-06-04 ENCOUNTER — Encounter (HOSPITAL_COMMUNITY): Payer: Self-pay | Admitting: *Deleted

## 2021-06-04 DIAGNOSIS — R55 Syncope and collapse: Secondary | ICD-10-CM | POA: Diagnosis not present

## 2021-06-04 DIAGNOSIS — Z5321 Procedure and treatment not carried out due to patient leaving prior to being seen by health care provider: Secondary | ICD-10-CM | POA: Insufficient documentation

## 2021-06-04 LAB — BASIC METABOLIC PANEL
Anion gap: 8 (ref 5–15)
BUN: 25 mg/dL — ABNORMAL HIGH (ref 8–23)
CO2: 23 mmol/L (ref 22–32)
Calcium: 8.9 mg/dL (ref 8.9–10.3)
Chloride: 112 mmol/L — ABNORMAL HIGH (ref 98–111)
Creatinine, Ser: 1.81 mg/dL — ABNORMAL HIGH (ref 0.44–1.00)
GFR, Estimated: 29 mL/min — ABNORMAL LOW (ref >60)
Glucose, Bld: 88 mg/dL (ref 70–99)
Potassium: 3.7 mmol/L (ref 3.5–5.1)
Sodium: 143 mmol/L (ref 135–145)

## 2021-06-04 LAB — CBC
HCT: 38.5 % (ref 36.0–46.0)
Hemoglobin: 12.3 g/dL (ref 12.0–15.0)
MCH: 29.6 pg (ref 26.0–34.0)
MCHC: 31.9 g/dL (ref 30.0–36.0)
MCV: 92.8 fL (ref 80.0–100.0)
Platelets: 300 10*3/uL (ref 150–400)
RBC: 4.15 MIL/uL (ref 3.87–5.11)
RDW: 15.6 % — ABNORMAL HIGH (ref 11.5–15.5)
WBC: 10.4 10*3/uL (ref 4.0–10.5)
nRBC: 0 % (ref 0.0–0.2)

## 2021-06-04 NOTE — ED Triage Notes (Signed)
States she was feeling faint at home , state she blood pressure was low at home and normal on arrival.

## 2021-06-04 NOTE — ED Triage Notes (Signed)
Felt faint after working outside in the heat

## 2021-06-11 DIAGNOSIS — E1140 Type 2 diabetes mellitus with diabetic neuropathy, unspecified: Secondary | ICD-10-CM | POA: Diagnosis not present

## 2021-06-11 DIAGNOSIS — G894 Chronic pain syndrome: Secondary | ICD-10-CM | POA: Diagnosis not present

## 2021-06-11 DIAGNOSIS — I10 Essential (primary) hypertension: Secondary | ICD-10-CM | POA: Diagnosis not present

## 2021-06-11 DIAGNOSIS — M1991 Primary osteoarthritis, unspecified site: Secondary | ICD-10-CM | POA: Diagnosis not present

## 2021-06-28 ENCOUNTER — Other Ambulatory Visit: Payer: Self-pay | Admitting: *Deleted

## 2021-06-29 NOTE — Patient Outreach (Signed)
[facility]) Care Management  [place]  06/29/2021   [name] 03-Dec-1945 [id number]  RN Health Coach telephone call to patient.  [employer] compliance verified. Per patient her blood pressure is better. Patient checks BP weekly. Patient appetite is good. Per patient she has lost some planned weight. Patient received a cortisone shot in knee. Per patient the knee pain is better. She still has some pain in her back.  Patient uses a cane when ambulating. Patient has not had any recent falls. Per patient she likes to sit outside and watch the birds and butterflies for stress management. Patient has agreed to further outreach calls.   Encounter Medications:  Outpatient Encounter Medications as of 06/28/2021  Medication Sig Note   acyclovir (ZOVIRAX) 400 MG tablet Take 400 mg by mouth 2 (two) times daily.     amLODipine (NORVASC) 10 MG tablet Take 1 tablet (10 mg total) by mouth daily.    aspirin EC 81 MG tablet Take 81 mg by mouth daily.    atorvastatin (LIPITOR) 40 MG tablet Take 40 mg by mouth daily.    B Complex-C (B-COMPLEX WITH VITAMIN C) tablet Take 1 tablet by mouth daily.    Biotin 5 MG CAPS Take 1 capsule by mouth daily.    celecoxib (CELEBREX) 200 MG capsule Take 1 capsule by mouth daily.    DULoxetine (CYMBALTA) 30 MG capsule Take 1 capsule by mouth daily.    EPINEPHrine 0.3 mg/0.3 mL IJ SOAJ injection Use as needed    fluticasone (FLONASE) 50 MCG/ACT nasal spray Place 2 sprays into both nostrils as needed for allergies.     furosemide (LASIX) 20 MG tablet Take 20-40 mg by mouth 2 (two) times daily. Take 40 mg in the morning and 20 mg in the evening 10/03/2019: Takes every other day    hydrochlorothiazide (MICROZIDE) 12.5 MG capsule TAKE 1 CAPSULE BY MOUTH  DAILY (Patient not taking: Reported on 05/07/2021)    HYDROcodone-acetaminophen (NORCO) 10-325 MG tablet Take 1 tablet by mouth every 4 (four) hours. (Patient not taking: Reported on 05/07/2021)     lisinopril (PRINIVIL,ZESTRIL) 40 MG tablet Take 40 mg by mouth daily.    Magnesium 250 MG TABS Take 1 tablet by mouth daily.    methocarbamol (ROBAXIN) 500 MG tablet Take 1 tablet by mouth every 8 (eight) hours as needed.    Multiple Vitamins-Minerals (PRESERVISION AREDS 2 PO) Take 2 tablets by mouth daily.     MYRBETRIQ 50 MG TB24 tablet Take 50 mg by mouth daily.    oxyCODONE (ROXICODONE) 15 MG immediate release tablet     Potassium Citrate 99 MG CAPS Take 1 capsule by mouth daily. 11/18/2019: PRN   pyridostigmine (MESTINON) 60 MG tablet Take 60 mg by mouth 3 (three) times daily.    pyridOXINE (VITAMIN B-6) 100 MG tablet Take 100 mg by mouth daily.    traMADol (ULTRAM) 50 MG tablet TAKE 1 2 TABLETS BY MOUTH 4 TIMES A DAY AS NEEDED PAIN, MAY ADD TO HYDROCODONE IF NEEDED    traZODone (DESYREL) 150 MG tablet Take 150 mg by mouth at bedtime.    vitamin B-12 (CYANOCOBALAMIN) 1000 MCG tablet Take 2,500 mcg by mouth daily.    Vitamins-Lipotropics (EAR HEALTH PLUS PO) Take 1 tablet by mouth daily.    No facility-administered encounter medications on file as of 06/28/2021.    Functional Status:  No flowsheet data found.  Fall/Depression Screening: Fall Risk  06/28/2021 02/26/2021 12/14/2020  Falls in the past  year? 1 1 1   Number falls in past yr: 1 1 1   Injury with Fall? 1 1 1   Risk for fall due to : History of fall(s);Impaired balance/gait;Impaired mobility History of fall(s);Impaired balance/gait;Impaired mobility History of fall(s);Impaired balance/gait;Impaired mobility  Follow up Falls evaluation completed Falls evaluation completed Falls evaluation completed   PHQ 2/9 Scores 06/28/2021 10/02/2019 03/05/2014  PHQ - 2 Score 1 1 2     Assessment:   Care Plan Care Plan : General Plan of Care (Adult)  Updates made by Jw Covin, Eppie Gibson, RN since 06/29/2021 12:00 AM     Problem: Therapeutic Alliance (General Plan of Care)      Long-Range Goal: Therapeutic Alliance Established   Start Date:  08/27/2020  Expected End Date: 06/29/2021  This Visit's Progress: On track  Recent Progress: On track  Priority: Medium  Note:   Evidence-based guidance:  Avoid value judgments; convey acceptance.  Encourage collaboration with the treatment team.  Establish rapport; develop trust relationship.  Therapist, art.  Provide emotional support; encourage patient to share feelings of anger, fear and anxiety.  Promote self-reliance and autonomy based on age and ability; discourage overprotection.  Use empathy and nonjudgmental, participatory manner.   Notes:  [id number] RN developed rapport with patient [id number] RN encourages patient to express self feelings    Task: Develop Relationship to Effect Behavior Change   Due Date: 10/29/2021  Note:   Care Management Activities:    - acceptance conveyed - care explained - choices provided - emotional support provided - empathic listening provided - questions answered - questions encouraged - verbalization of feelings encouraged    Notes:  [id number] Patient encouraged to explain concerns    Problem: Health Promotion or Disease Self-Management (General Plan of Care)   Priority: Medium  Onset Date: 08/27/2020     Goal: Self-Management Plan Developed   Start Date: 12/15/2020  Expected End Date: 10/29/2021  This Visit's Progress: On track  Note:   Evidence-based guidance:  Review biopsychosocial determinants of health screens.  Determine level of modifiable health risk.  Assess level of patient activation, level of readiness, importance and confidence to make changes.  Evoke change talk using open-ended questions, pros and cons, as well as looking forward.  Identify areas where behavior change may lead to improved health.  Partner with patient to develop a robust self-management plan that includes lifestyle factors, such as weight loss, exercise and healthy nutrition, as well as goals specific to disease risks.  Support patient and  family/caregiver active participation in decision-making and self-management plan.  Implement additional goals and interventions based on identified risk factors to reduce health risk.  Facilitate advance care planning.  Review need for preventive screening based on age, sex, family history and health history.   Notes:     Problem: Coping Skills (General Plan of Care)   Priority: Medium  Onset Date: 12/15/2020     Long-Range Goal: Coping Skills Enhanced   Start Date: 08/27/2020  Expected End Date: 06/29/2021  This Visit's Progress: On track  Recent Progress: On track  Priority: Medium  Note:   Evidence-based guidance:  Acknowledge, normalize and validate difficulty of making life-long lifestyle changes.  Identify current effective and ineffective coping strategies.  Encourage patient and caregiver participation in care to increase self-esteem, confidence and feelings of control.  Consider alternative and complementary therapy approaches such as meditation, mindfulness or yoga.  Encourage participation in cognitive behavioral therapy to foster a positive identity, increase self-awareness, as well as bolster self-esteem, confidence  and self-efficacy.  Discuss spirituality; be present as concerns are identified; encourage journaling, prayer, worship services, meditation or pastoral counseling.  Encourage participation in pleasurable group activities such as hobbies, singing, sports or volunteering).  Encourage the use of mindfulness; refer for training or intensive intervention.  Consider the use of meditative movement therapy such as tai chi, yoga or qigong.  Promote a regular daily exercise program based on tolerance, ability and patient choice to support positive thinking about disease or aging.   Notes:  [id number] Patient is using crafting and painting to help her with coping skills [id number] Patient has been setting in her bird watching area and drinking coffee to help cope  with  stress    Problem: Quality of Life (General Plan of Care)   Priority: Medium  Onset Date: 08/27/2021     Long-Range Goal: Quality of Life Maintained   Start Date: 12/15/2020  Expected End Date: 10/29/2021  Note:   Evidence-based guidance:  Assess patient's thoughts about quality of life, goals and expectations, and dissatisfaction or desire to improve.  Identify issues of primary importance such as mental health, illness, exercise tolerance, pain, sexual function and intimacy, cognitive change, social isolation, finances and relationships.  Assess and monitor for signs/symptoms of psychosocial concerns, especially depression or ideations regarding harm to others or self; provide or refer for mental health services as needed.  Identify sensory issues that impact quality of life such as hearing loss, vision deficit; strategize ways to maintain or improve hearing, vision.  Promote access to services in the community to support independence such as support groups, home visiting programs, financial assistance, handicapped parking tags, durable medical equipment and emergency responder.  Promote activities to decrease social isolation such as group support or social, leisure and recreational activities, employment, use of social media; consider safety concerns about being out of home for activities.  Provide patient an opportunity to share by storytelling or a "life review" to give positive meaning to life and to assist with coping and negative experiences.  Encourage patient to tap into hope to improve sense of self.  Counsel based on prognosis and as early as possible about end-of-life and palliative care; consider referral to palliative care provider.  Advocate for the development of palliative care plan that may include avoidance of unnecessary testing and intervention, symptom control, discontinuation of medications, hospice and organ donation.  Counsel as early as possible those with  life-limiting chronic disease about palliative care; consider referral to palliative care provider.  Advocate for the development of palliative care plan.   Notes:  [id number] Encourage patient to get counseling [phone] Encouraged patient to get out and socialize    Task: Support and Maintain Acceptable Degree of Health, Comfort and Happiness   Due Date: 10/29/2021  Note:   Care Management Activities:    - community involvement promoted - expression of thoughts about present/future encouraged - independence in all possible areas promoted - patient strengths promoted - self-expression encouraged - wellness behaviors promoted    Notes:  [id number]  Patient is working in yard, has a bird watching area set up in yard to decrease stress    Care Plan : Hypertension (Adult)  Updates made by Verlin Grills, RN since 06/29/2021 12:00 AM     Problem: Hypertension (Hypertension)   Priority: High  Onset Date: 08/27/2020  Note:   Per patient her blood pressure is doing much better    Goal: Hypertension Monitored   Start Date: 12/15/2020  Expected End Date: 10/29/2021  This Visit's Progress: On track  Note:   Evidence-based guidance:  Promote initial use of ambulatory blood pressure measurements (for 3 days) to rule out "white-coat" effect; identify masked hypertension and presence or absence of nocturnal "dipping" of blood pressure.   Encourage continued use of home blood pressure monitoring and recording in blood pressure log; include symptoms of hypotension or potential medication side effects in log.  Review blood pressure measurements taken inside and outside of the provider office; establish baseline and monitor trends; compare to target ranges or patient goal.  Share overall cardiovascular risk with patient; encourage changes to lifestyle risk factors, including alcohol consumption, smoking, inadequate exercise, poor dietary habits and stress.   Notes:     Problem: Disease  Progression (Hypertension)   Priority: Medium  Onset Date: 06/29/2021     Long-Range Goal: Disease Progression Prevented or Minimized   Start Date: 08/27/2020  Expected End Date: 10/29/2021  This Visit's Progress: On track  Note:   Evidence-based guidance:  Tailor lifestyle advice to individual; review progress regularly; give frequent encouragement and respond positively to incremental successes.  Assess for and promote awareness of worsening disease or development of comorbidity.  Prepare patient for laboratory and diagnostic exams based on risk and presentation.  Prepare patient for use of pharmacologic therapy that may include diuretic, beta-blocker, beta-blocker/thiazide combination, angiotensin-converting enzyme inhibitor, renin-angiotensin blocker or calcium-channel blocker.  Expect periodic adjustments to pharmacologic therapy; manage side effects.  Promote a healthy diet that includes primarily plant-based foods, such as fruits, vegetables, whole grains, beans and legumes, low-fat dairy and lean meats.   Consider moderate reduction in sodium intake by avoiding the addition of salt to prepared foods and limiting processed meats, canned soup, frozen meals and salty snacks.   Promote a regular, daily exercise goal of 150 minutes per week of moderate exercise based on tolerance, ability and patient choice; consider referral to physical therapist, community wellness and/or activity program.  Encourage the avoidance of no more than 2 hours per day of sedentary activity, such as recreational screen time.  Review sources of stress; explore current coping strategies and encourage use of mindfulness, yoga, meditation or exercise to manage stress.   Notes:     Problem: Resistant Hypertension (Hypertension)   Priority: Medium  Onset Date: 08/27/2020     Goal: Response to Treatment Maximized   Start Date: 12/15/2020  Expected End Date: 10/29/2021  This Visit's Progress: On track  Note:    Evidence-based guidance:  Assess patient response to treatment, including presence or absence of medication side effects, degree of blood pressure control and patient satisfaction.  Assess technique (including cuff size and placement), measurement times, condition and calibration of blood pressure cuff set (both at-home and in-office equipment).  Assess factors that may influence response to treatment, including nonadherence to pharmacologic treatment plan, diet or activity changes and/or presence of pain, stress or sleep disturbance.  Screen for signs and symptoms of depression; if present, refer for or complete a comprehensive assessment.  Evaluate social and economic barriers that may affect adherence to treatment plan  Address pharmacologic nonadherence by simplifying dosing regimen, counseling or support by pharmacist, financial assistance, self-monitoring of blood pressure, use of motivational interviewing, voice or text messages.  Encourage behavioral adherence strategies, like habit-based interventions that link medication taking with existing daily routines.  Assess barriers to regular, daily physical activity; support family or support person-oriented activity changes and utilization of community activity or sports program.  Address barriers to dietary changes, especially sodium  restriction, with referrals to community programs, like cooking classes, meal services or intensive education when available.  Refer to community-based peer support program or nurse home-visiting program.  Assess for chronic pain; when present add additional goals (Chronic Pain Care Plan Guide) as needed.  Provide frequent follow-up by telephone, telemonitoring, patient-practice portal or with home visit.  Review alcohol use screen; address using brief intervention beginning with risk that interferes with blood pressure control; refer for treatment when excessive alcohol use is noted.  Screen for obstructive sleep  apnea; prepare patient for polysomnography based on risk and presentation and use of noninvasive ventilation to relieve obstructive sleep apnea when present.   Notes:     Task: Facilitate Adherence to Lifestyle Change   Due Date: 10/29/2021  Note:   Care Management Activities:    - depression screen reviewed - medication adherence assessment completed - pain assessed and managed - resources needed to manage barriers to lifestyle change identified - support and encouragement provided    Notes:       Goals Addressed             This Visit's Progress    (THN)Lifestyle Change-Hypertension   On track    Timeframe:  Long-Range Goal Priority:  High Start Date:        [id number]                     Expected End Date:      [id number]                 Follow Up Date [id number]   - agree to work together to make changes - ask questions to understand - learn about high blood pressure    Why is this important?   The changes that you are asked to make may be hard to do.  This is especially true when the changes are life-long.  Knowing why it is important to you is the first step.  Working on the change with your family or support person helps you not feel alone.  Reward yourself and family or support person when goals are met. This can be an activity you choose like bowling, hiking, biking, swimming or shooting hoops.     Notes:  [id number] Patient has a bird watching area in yard to set for stress relief     [facility] and Keep All Appointments   On track    Timeframe:  Long-Range Goal Priority:  Medium Start Date:      [id number]                       Expected End Date:        54627035     Follow Up Date [id number]   - call to cancel if needed - keep a calendar with prescription refill dates - keep a calendar with appointment dates    Why is this important?   Part of staying healthy is seeing the doctor for follow-up care.  If you forget your appointments, there are some things you  can do to stay on track.    Notes:  [id number] Patient has kept all appointments     (THN)Track and Manage My Blood Pressure   On track    Timeframe:  Long-Range Goal Priority:  High Start Date:    [id number]  Expected End Date:      [id number]                Follow Up Date [id number] - check blood pressure daily - write blood pressure results in a log or diary    Why is this important?   You won't feel high blood pressure, but it can still hurt your blood vessels.  High blood pressure can cause heart or kidney problems. It can also cause a stroke.  Making lifestyle changes like losing a little weight or eating less salt will help.  Checking your blood pressure at home and at different times of the day can help to control blood pressure.  If the doctor prescribes medicine remember to take it the way the doctor ordered.  Call the office if you cannot afford the medicine or if there are questions about it.     Notes:  04/29 Patient had not been monitoring her blood pressure [id number] Per patient she is monitoring once a week.        Plan:  Follow-up: Follow-up in 3 month(s) RN ordered free COVID kits RN sent educational material on hypertension RN sent Guide for your journey with diabetes RN sent update assessment to PCP  [facility] [phone]

## 2021-06-29 NOTE — Patient Instructions (Signed)
Goals Addressed             This Visit's Progress    (THN)Lifestyle Change-Hypertension   On track    Timeframe:  Long-Range Goal Priority:  High Start Date:        [id number]                     Expected End Date:      61443154                 Follow Up Date [id number]   - agree to work together to make changes - ask questions to understand - learn about high blood pressure    Why is this important?   The changes that you are asked to make may be hard to do.  This is especially true when the changes are life-long.  Knowing why it is important to you is the first step.  Working on the change with your family or support person helps you not feel alone.  Reward yourself and family or support person when goals are met. This can be an activity you choose like bowling, hiking, biking, swimming or shooting hoops.     Notes:  [id number] Patient has a bird watching area in yard to set for stress relief     [facility] and Keep All Appointments   On track    Timeframe:  Long-Range Goal Priority:  Medium Start Date:      [id number]                       Expected End Date:        33825053     Follow Up Date [id number]   - call to cancel if needed - keep a calendar with prescription refill dates - keep a calendar with appointment dates    Why is this important?   Part of staying healthy is seeing the doctor for follow-up care.  If you forget your appointments, there are some things you can do to stay on track.    Notes:  [id number] Patient has kept all appointments     (THN)Track and Manage My Blood Pressure   On track    Timeframe:  Long-Range Goal Priority:  High Start Date:    73532992                         Expected End Date:      [id number]                Follow Up Date [id number] - check blood pressure daily - write blood pressure results in a log or diary    Why is this important?   You won't feel high blood pressure, but it can still hurt your blood vessels.  High blood pressure  can cause heart or kidney problems. It can also cause a stroke.  Making lifestyle changes like losing a little weight or eating less salt will help.  Checking your blood pressure at home and at different times of the day can help to control blood pressure.  If the doctor prescribes medicine remember to take it the way the doctor ordered.  Call the office if you cannot afford the medicine or if there are questions about it.     Notes:  04/29 Patient had not been monitoring her blood pressure [id number] Per patient she is monitoring once a week.

## 2021-07-09 DIAGNOSIS — Z23 Encounter for immunization: Secondary | ICD-10-CM | POA: Diagnosis not present

## 2021-07-09 DIAGNOSIS — G894 Chronic pain syndrome: Secondary | ICD-10-CM | POA: Diagnosis not present

## 2021-07-09 DIAGNOSIS — M1991 Primary osteoarthritis, unspecified site: Secondary | ICD-10-CM | POA: Diagnosis not present

## 2021-07-09 DIAGNOSIS — E1140 Type 2 diabetes mellitus with diabetic neuropathy, unspecified: Secondary | ICD-10-CM | POA: Diagnosis not present

## 2021-07-13 ENCOUNTER — Other Ambulatory Visit: Payer: Self-pay | Admitting: Internal Medicine

## 2021-07-13 DIAGNOSIS — Z1231 Encounter for screening mammogram for malignant neoplasm of breast: Secondary | ICD-10-CM

## 2021-08-11 DIAGNOSIS — E1140 Type 2 diabetes mellitus with diabetic neuropathy, unspecified: Secondary | ICD-10-CM | POA: Diagnosis not present

## 2021-08-11 DIAGNOSIS — I10 Essential (primary) hypertension: Secondary | ICD-10-CM | POA: Diagnosis not present

## 2021-08-11 DIAGNOSIS — G894 Chronic pain syndrome: Secondary | ICD-10-CM | POA: Diagnosis not present

## 2021-08-18 ENCOUNTER — Ambulatory Visit: Payer: Medicare Other

## 2021-08-30 DIAGNOSIS — M1711 Unilateral primary osteoarthritis, right knee: Secondary | ICD-10-CM | POA: Diagnosis not present

## 2021-09-10 DIAGNOSIS — M1991 Primary osteoarthritis, unspecified site: Secondary | ICD-10-CM | POA: Diagnosis not present

## 2021-09-10 DIAGNOSIS — G894 Chronic pain syndrome: Secondary | ICD-10-CM | POA: Diagnosis not present

## 2021-09-10 DIAGNOSIS — E1140 Type 2 diabetes mellitus with diabetic neuropathy, unspecified: Secondary | ICD-10-CM | POA: Diagnosis not present

## 2021-09-10 DIAGNOSIS — I10 Essential (primary) hypertension: Secondary | ICD-10-CM | POA: Diagnosis not present

## 2021-09-13 ENCOUNTER — Ambulatory Visit
Admission: RE | Admit: 2021-09-13 | Discharge: 2021-09-13 | Disposition: A | Discharge status: Home / Self Care | Payer: Medicare Other | Source: Ambulatory Visit | Attending: Internal Medicine | Admitting: Internal Medicine

## 2021-09-13 DIAGNOSIS — Z1231 Encounter for screening mammogram for malignant neoplasm of breast: Secondary | ICD-10-CM

## 2021-09-29 ENCOUNTER — Other Ambulatory Visit: Payer: Self-pay | Admitting: *Deleted

## 2021-09-30 NOTE — Patient Outreach (Signed)
[facility]) Care Management [place] Note   09/30/2021 Name:  [name] MRN:  [id number] DOB:  Dec 03, 1945  Summary: Patient has not been monitoring her blood pressure. Per patient she has not been taking care of herself. She has been taking care of her husband who has been in the hospital frequently. Patient I very emotional and crying. She stated it has been very hard. RN discussed with patient about discussing with her husband Dr  about some of the care services she needs. Patient stated she is cooking more meals and decreasing the sodium.   Recommendations/Changes made from today's visit: Monitor Blood pressure weekly Plan Healthy Meals   Subjective: [name] is an 75 y.o. year old female who is a primary patient of [employer], [employer]. The care management team was consulted for assistance with care management and/or care coordination needs.    RN Health Coach completed Telephone Visit today.   Objective:  Medications Reviewed Today     Reviewed by Verlin Grills, RN (Case Manager) on 06/28/21 at 56  Med List Status: <None>   Medication Order Taking? Sig Documenting Provider Last Dose Status Informant  acyclovir (ZOVIRAX) 400 MG tablet [id number] No Take 400 mg by mouth 2 (two) times daily.  [provider] Taking Active Multiple Informants  amLODipine (NORVASC) 10 MG tablet [id number] No Take 1 tablet (10 mg total) by mouth daily. Lorretta Harp, MD Taking Active   aspirin EC 81 MG tablet [id number] No Take 81 mg by mouth daily. [provider] Taking Active Multiple Informants  atorvastatin (LIPITOR) 40 MG tablet [id number] No Take 40 mg by mouth daily. [provider] Taking Active   B Complex-C (B-COMPLEX WITH VITAMIN C) tablet [id number] No Take 1 tablet by mouth daily. [provider] Taking Active Multiple Informants  Biotin 5 MG CAPS [id number] No Take 1 capsule by mouth daily. [provider] Taking Active Multiple Informants  celecoxib (CELEBREX) 200 MG capsule [id number] No Take 1 capsule by mouth daily. [provider] Taking Active   DULoxetine (CYMBALTA) 30 MG capsule [id number] No Take 1 capsule by mouth daily. [provider] Taking Active Multiple Informants  EPINEPHrine 0.3 mg/0.3 mL IJ SOAJ injection [id number] No Use as needed [provider] Taking Active   fluticasone (FLONASE) 50 MCG/ACT nasal spray [id number] No Place 2 sprays into both nostrils as needed for allergies.  [provider] Taking Active Multiple Informants  furosemide (LASIX) 20 MG tablet [id number] No Take 20-40 mg by mouth 2 (two) times daily. Take 40 mg in the morning and 20 mg in the evening [provider] Taking Active Multiple Informants           Med Note Regino Bellow Oct 03, 2019  1:13 PM) Takes every other day   hydrochlorothiazide (MICROZIDE) 12.5 MG capsule [id number] No TAKE 1 CAPSULE BY MOUTH  DAILY  Patient not taking: Reported on 05/07/2021   Lorretta Harp, MD Not Taking Active   HYDROcodone-acetaminophen [facility]) 10-325 MG tablet [id number] No Take 1 tablet by mouth every 4 (four) hours.  Patient not taking: Reported on 05/07/2021   [provider] Not Taking Active Multiple Informants  lisinopril (PRINIVIL,ZESTRIL) 40 MG tablet [id number] No Take 40 mg by mouth daily. [provider] Taking Active Multiple Informants  Magnesium 250 MG TABS [id number] No Take 1 tablet by mouth daily. [provider] Taking Active Multiple Informants  methocarbamol (ROBAXIN) 500  MG tablet [id number] No Take 1 tablet by mouth every 8 (eight) hours as needed. [provider] Taking Active   Multiple Vitamins-Minerals (PRESERVISION AREDS 2 PO) [id number] No Take 2 tablets by mouth daily.  [provider] Taking Active Multiple Informants  MYRBETRIQ 50 MG TB24 tablet [id number] No Take 50 mg by mouth daily. [provider] Taking Active   oxyCODONE (ROXICODONE) 15 MG immediate release tablet [id number] No  [provider] Taking Active   Potassium Citrate 99 MG CAPS [id number] No Take 1 capsule by mouth daily. [provider] Taking Active Multiple Informants           Med Note Corinna Lines Nov 18, 2019  1:44 PM) PRN  pyridostigmine (MESTINON) 60 MG tablet [id number] No Take 60 mg by mouth 3 (three) times daily. [provider] Taking Active   pyridOXINE (VITAMIN B-6) 100 MG tablet [id number] No Take 100 mg by mouth daily. [provider] Taking Active Multiple Informants  traMADol (ULTRAM) 50 MG tablet [id number] No TAKE 1 2 TABLETS BY MOUTH 4 TIMES A DAY AS NEEDED PAIN, MAY ADD TO HYDROCODONE IF NEEDED [provider] Taking Active   traZODone (DESYREL) 150 MG tablet [id number] No Take 150 mg by mouth at bedtime. [provider] Taking Active Multiple Informants           Med Note Romilda Garret Nov 11, 2015  5:02 PM)     vitamin B-12 (CYANOCOBALAMIN) 1000 MCG tablet [id number] No Take 2,500 mcg by mouth daily. [provider] Taking Active Multiple Informants  Vitamins-Lipotropics (EAR HEALTH PLUS PO) [id number] No Take 1 tablet by mouth daily. [provider] Taking Active Multiple Informants             SDOH:  (Social Determinants of Health) assessments and interventions performed:  SDOH Interventions    Flowsheet Row Most Recent Value  SDOH Interventions   Food Insecurity Interventions Intervention Not Indicated  Housing Interventions Intervention Not Indicated  Stress Interventions Offered [employer], Other (Comment)  [Discussed with patient to reach out asking others to assist with husband care]  Transportation Interventions Intervention Not Indicated  Depression Interventions/Treatment  Medication       Care Plan  Review of patient past medical history, allergies,  medications, health status, including review of consultants reports, laboratory and other test data, was performed as part of comprehensive evaluation for care management services.   There are no care plans that you recently modified to display for this patient.    Plan: Telephone follow up appointment with care management team member scheduled for:  December 2022 The patient has been provided with contact information for the care management team and has been advised to call with any health related questions or concerns.    [place] Care Management [phone]

## 2021-09-30 NOTE — Patient Instructions (Signed)
Visit Information  Thank you for taking time to visit with me today. Please don't hesitate to contact me if I can be of assistance to you before our next scheduled telephone appointment.  Following are the goals we discussed today:  (Current Barriers:  Knowledge Deficits related to plan of care for management of HTN   RNCM Clinical Goal(s):  Patient will verbalize understanding of plan for management of HTN as evidenced by continuation of monitoring blood pressure and adhering to low sodium diet through collaboration with RN Care manager, provider, and care team.   Interventions: Inter-disciplinary care team collaboration (see longitudinal plan of care) Evaluation of current treatment plan related to  self management and patient's adherence to plan as established by provider Patient will monitor blood pressure at least once a week  Patient Goals/Self-Care Activities: Take medications as prescribed   Attend all scheduled provider appointments Call pharmacy for medication refills 3-7 days in advance of running out of medications Attend church or other social activities Perform all self care activities independently  Perform IADL's (shopping, preparing meals, housekeeping, managing finances) independently Call provider office for new concerns or questions  check blood pressure weekly learn about high blood pressure take blood pressure log to all doctor appointments call doctor for signs and symptoms of high blood pressure develop an action plan for high blood pressure keep all doctor appointments take medications for blood pressure exactly as prescribed report new symptoms to your doctor limit salt intake to 2300 mg/day    Our next appointment is by telephone on December 29,2022  Please call the care guide team at [phone] if you need to cancel or reschedule your appointment.   Please call the Suicide and Crisis Lifeline: 988 if you are experiencing a Mental Health or  [place] or need someone to talk to.  The patient verbalized understanding of instructions, educational materials, and care plan provided today and agreed to receive a mailed copy of patient instructions, educational materials, and care plan.   Telephone follow up appointment with care management team member scheduled for: The patient has been provided with contact information for the care management team and has been advised to call with any health related questions or concerns.   [place] Care Management [phone]

## 2021-10-11 DIAGNOSIS — I10 Essential (primary) hypertension: Secondary | ICD-10-CM | POA: Diagnosis not present

## 2021-10-11 DIAGNOSIS — M1991 Primary osteoarthritis, unspecified site: Secondary | ICD-10-CM | POA: Diagnosis not present

## 2021-10-11 DIAGNOSIS — G894 Chronic pain syndrome: Secondary | ICD-10-CM | POA: Diagnosis not present

## 2021-10-11 DIAGNOSIS — E1140 Type 2 diabetes mellitus with diabetic neuropathy, unspecified: Secondary | ICD-10-CM | POA: Diagnosis not present

## 2021-10-28 ENCOUNTER — Other Ambulatory Visit: Payer: Self-pay | Admitting: *Deleted

## 2021-10-28 NOTE — Patient Outreach (Signed)
[facility]) Care Management  10/28/2021  [name] 13-Apr-1946 [id number]   RN Health Coach attempted follow up outreach call to patient.  Patient was unavailable. HIPPA compliance voicemail message left with return callback number.  Plan: RN will call patient again within 30 days.  [place] Care Management [phone]

## 2021-11-09 DIAGNOSIS — M1711 Unilateral primary osteoarthritis, right knee: Secondary | ICD-10-CM | POA: Diagnosis not present

## 2021-11-11 DIAGNOSIS — I10 Essential (primary) hypertension: Secondary | ICD-10-CM | POA: Diagnosis not present

## 2021-11-11 DIAGNOSIS — E538 Deficiency of other specified B group vitamins: Secondary | ICD-10-CM | POA: Diagnosis not present

## 2021-11-11 DIAGNOSIS — Z0000 Encounter for general adult medical examination without abnormal findings: Secondary | ICD-10-CM | POA: Diagnosis not present

## 2021-11-11 DIAGNOSIS — E559 Vitamin D deficiency, unspecified: Secondary | ICD-10-CM | POA: Diagnosis not present

## 2021-11-11 DIAGNOSIS — E782 Mixed hyperlipidemia: Secondary | ICD-10-CM | POA: Diagnosis not present

## 2021-11-11 DIAGNOSIS — Z9229 Personal history of other drug therapy: Secondary | ICD-10-CM | POA: Diagnosis not present

## 2021-11-11 DIAGNOSIS — G894 Chronic pain syndrome: Secondary | ICD-10-CM | POA: Diagnosis not present

## 2021-11-11 DIAGNOSIS — E1140 Type 2 diabetes mellitus with diabetic neuropathy, unspecified: Secondary | ICD-10-CM | POA: Diagnosis not present

## 2021-11-29 ENCOUNTER — Other Ambulatory Visit: Payer: Self-pay | Admitting: *Deleted

## 2021-11-29 NOTE — Patient Outreach (Signed)
[facility]) Care Management [place] Note   11/29/2021 Name:  [name] MRN:  [id number] DOB:  Jan 23, 1946  Summary: Per patient she has been monitoring her blood pressure. The readings are much better.  Per patient she is very emotional and under a lot of stress with caring for her husband. Per patient she is taking all medications as per ordered. Per patient she is not exercising. She is cooking most meals and trying to eat healthier.   Recommendations/Changes made from today's visit: Patient will contact [facility] for the benefits Continue to monitor blood pressure Continue to take medications as per ordered   Subjective: [name] is an 76 y.o. year old female who is a primary patient of [employer], [employer]. The care management team was consulted for assistance with care management and/or care coordination needs.    RN Health Coach completed Telephone Visit today.   Objective:  Medications Reviewed Today     Reviewed by Verlin Grills, RN (Case Manager) on 06/28/21 at 4  Med List Status: <None>   Medication Order Taking? Sig Documenting Provider Last Dose Status Informant  acyclovir (ZOVIRAX) 400 MG tablet [id number] No Take 400 mg by mouth 2 (two) times daily.  [provider] Taking Active Multiple Informants  amLODipine (NORVASC) 10 MG tablet [id number] No Take 1 tablet (10 mg total) by mouth daily. Lorretta Harp, MD Taking Active   aspirin EC 81 MG tablet [id number] No Take 81 mg by mouth daily. [provider] Taking Active Multiple Informants  atorvastatin (LIPITOR) 40 MG tablet [id number] No Take 40 mg by mouth daily. [provider] Taking Active   B Complex-C (B-COMPLEX WITH VITAMIN C) tablet [id number] No Take 1 tablet by mouth daily. [provider] Taking Active Multiple Informants  Biotin 5 MG CAPS [id number] No Take 1 capsule by mouth daily. [provider] Taking Active Multiple Informants   celecoxib (CELEBREX) 200 MG capsule [id number] No Take 1 capsule by mouth daily. [provider] Taking Active   DULoxetine (CYMBALTA) 30 MG capsule [id number] No Take 1 capsule by mouth daily. [provider] Taking Active Multiple Informants  EPINEPHrine 0.3 mg/0.3 mL IJ SOAJ injection [id number] No Use as needed [provider] Taking Active   fluticasone (FLONASE) 50 MCG/ACT nasal spray [id number] No Place 2 sprays into both nostrils as needed for allergies.  [provider] Taking Active Multiple Informants  furosemide (LASIX) 20 MG tablet [id number] No Take 20-40 mg by mouth 2 (two) times daily. Take 40 mg in the morning and 20 mg in the evening [provider] Taking Active Multiple Informants           Med Note Regino Bellow Oct 03, 2019  1:13 PM) Takes every other day   hydrochlorothiazide (MICROZIDE) 12.5 MG capsule [id number] No TAKE 1 CAPSULE BY MOUTH  DAILY  Patient not taking: Reported on 05/07/2021   Lorretta Harp, MD Not Taking Active   HYDROcodone-acetaminophen [facility]) 10-325 MG tablet [id number] No Take 1 tablet by mouth every 4 (four) hours.  Patient not taking: Reported on 05/07/2021   [provider] Not Taking Active Multiple Informants  lisinopril (PRINIVIL,ZESTRIL) 40 MG tablet [id number] No Take 40 mg by mouth daily. [provider] Taking Active Multiple Informants  Magnesium 250 MG TABS [id number] No Take 1 tablet by mouth daily. [provider] Taking Active Multiple Informants  methocarbamol (ROBAXIN) 500 MG tablet [id number] No Take 1  tablet by mouth every 8 (eight) hours as needed. [provider] Taking Active   Multiple Vitamins-Minerals (PRESERVISION AREDS 2 PO) [id number] No Take 2 tablets by mouth daily.  [provider] Taking Active Multiple Informants  MYRBETRIQ 50 MG TB24 tablet [id number] No Take 50 mg by mouth daily. [provider] Taking Active   oxyCODONE  (ROXICODONE) 15 MG immediate release tablet [id number] No  [provider] Taking Active   Potassium Citrate 99 MG CAPS [id number] No Take 1 capsule by mouth daily. [provider] Taking Active Multiple Informants           Med Note Corinna Lines Nov 18, 2019  1:44 PM) PRN  pyridostigmine (MESTINON) 60 MG tablet [id number] No Take 60 mg by mouth 3 (three) times daily. [provider] Taking Active   pyridOXINE (VITAMIN B-6) 100 MG tablet [id number] No Take 100 mg by mouth daily. [provider] Taking Active Multiple Informants  traMADol (ULTRAM) 50 MG tablet [id number] No TAKE 1 2 TABLETS BY MOUTH 4 TIMES A DAY AS NEEDED PAIN, MAY ADD TO HYDROCODONE IF NEEDED [provider] Taking Active   traZODone (DESYREL) 150 MG tablet [id number] No Take 150 mg by mouth at bedtime. [provider] Taking Active Multiple Informants           Med Note Romilda Garret Nov 11, 2015  5:02 PM)     vitamin B-12 (CYANOCOBALAMIN) 1000 MCG tablet [id number] No Take 2,500 mcg by mouth daily. [provider] Taking Active Multiple Informants  Vitamins-Lipotropics (EAR HEALTH PLUS PO) [id number] No Take 1 tablet by mouth daily. [provider] Taking Active Multiple Informants             SDOH:  (Social Determinants of Health) assessments and interventions performed:  SDOH Interventions    Flowsheet Row Most Recent Value  SDOH Interventions   Food Insecurity Interventions Intervention Not Indicated  Housing Interventions Intervention Not Indicated  Stress Interventions Offered [facility]  [reiterated asking for assistance/Church is offering help]  Transportation Interventions Intervention Not Indicated       Care Plan  Review of patient past medical history, allergies, medications, health status, including review of consultants reports, laboratory and other test data, was performed as part of  comprehensive evaluation for care management services.   Care Plan : RN Care Manager Plan of Care  Updates made by Karsen Fellows, Eppie Gibson, RN since 11/29/2021 12:00 AM     Problem: Knowledge Deficit Related to Hypertension and Care Coordination needs   Priority: High     Long-Range Goal: Development Plan of Care for Management of Hypertension   Start Date: 09/29/2021  Expected End Date: 10/29/2022  Priority: High  Note:   Current Barriers:  Knowledge Deficits related to plan of care for management of HTN   RNCM Clinical Goal(s):  Patient will verbalize understanding of plan for management of HTN as evidenced by continuation of monitoring blood pressure and adhering to low sodium diet  through collaboration with RN Care manager, provider, and care team.   Interventions: Inter-disciplinary care team collaboration (see longitudinal plan of care) Evaluation of current treatment plan related to  self management and patient's adherence to plan as established by provider  Patient Goals/Self-Care Activities: Take medications as prescribed   Attend all scheduled provider appointments Call pharmacy for medication refills 3-7 days in advance of running out of medications Attend church or other social activities Perform all self  care activities independently  Perform IADL's (shopping, preparing meals, housekeeping, managing finances) independently Call provider office for new concerns or questions  call the Suicide and Crisis Lifeline: 988 if experiencing a Mental Health or [place]  check blood pressure 3 times per week choose a place to take my blood pressure (home, clinic or office, retail store) learn about high blood pressure take blood pressure log to all doctor appointments call doctor for signs and symptoms of high blood pressure develop an action plan for high blood pressure keep all doctor appointments take medications for blood pressure exactly as  prescribed report new symptoms to your doctor limit salt intake to 2300 mg/day        Plan: Telephone follow up appointment with care management team member scheduled for:  April 2023 The patient has been provided with contact information for the care management team and has been advised to call with any health related questions or concerns.    [place] Care Management [phone]

## 2021-11-29 NOTE — Patient Instructions (Signed)
Visit Information  Thank you for taking time to visit with me today. Please don't hesitate to contact me if I can be of assistance to you before our next scheduled telephone appointment.  Following are the goals we discussed today:  Current Barriers:  Knowledge Deficits related to plan of care for management of HTN   RNCM Clinical Goal(s):  Patient will verbalize understanding of plan for management of HTN as evidenced by continuation of monitoring blood pressure and adhering to low sodium diet through collaboration with RN Care manager, provider, and care team.   Interventions: Inter-disciplinary care team collaboration (see longitudinal plan of care) Evaluation of current treatment plan related to  self management and patient's adherence to plan as established by provider  Patient Goals/Self-Care Activities: Take medications as prescribed   Attend all scheduled provider appointments Call pharmacy for medication refills 3-7 days in advance of running out of medications Attend church or other social activities Perform all self care activities independently  Perform IADL's (shopping, preparing meals, housekeeping, managing finances) independently Call provider office for new concerns or questions  call the Suicide and Crisis Lifeline: 988 if experiencing a Mental Health or [place]  check blood pressure 3 times per week choose a place to take my blood pressure (home, clinic or office, retail store) learn about high blood pressure take blood pressure log to all doctor appointments call doctor for signs and symptoms of high blood pressure develop an action plan for high blood pressure keep all doctor appointments take medications for blood pressure exactly as prescribed report new symptoms to your doctor limit salt intake to 2300 mg/day    Our next appointment is by telephone on April 2023  Please call Johny Shock at [phone]  if you need to cancel or  reschedule your appointment.   Please call the Suicide and Crisis Lifeline: 988 if you are experiencing a Mental Health or [place] or need someone to talk to.  The patient verbalized understanding of instructions, educational materials, and care plan provided today and agreed to receive a mailed copy of patient instructions, educational materials, and care plan.   Telephone follow up appointment with care management team member scheduled for: The patient has been provided with contact information for the care management team and has been advised to call with any health related questions or concerns.   [place] [facility] Management [phone]

## 2021-12-13 DIAGNOSIS — E1140 Type 2 diabetes mellitus with diabetic neuropathy, unspecified: Secondary | ICD-10-CM | POA: Diagnosis not present

## 2021-12-13 DIAGNOSIS — I10 Essential (primary) hypertension: Secondary | ICD-10-CM | POA: Diagnosis not present

## 2021-12-13 DIAGNOSIS — G894 Chronic pain syndrome: Secondary | ICD-10-CM | POA: Diagnosis not present

## 2021-12-23 DIAGNOSIS — G894 Chronic pain syndrome: Secondary | ICD-10-CM | POA: Diagnosis not present

## 2021-12-23 DIAGNOSIS — I10 Essential (primary) hypertension: Secondary | ICD-10-CM | POA: Diagnosis not present

## 2021-12-23 DIAGNOSIS — N390 Urinary tract infection, site not specified: Secondary | ICD-10-CM | POA: Diagnosis not present

## 2021-12-23 DIAGNOSIS — M1991 Primary osteoarthritis, unspecified site: Secondary | ICD-10-CM | POA: Diagnosis not present

## 2021-12-23 DIAGNOSIS — E1140 Type 2 diabetes mellitus with diabetic neuropathy, unspecified: Secondary | ICD-10-CM | POA: Diagnosis not present

## 2022-01-07 DIAGNOSIS — G894 Chronic pain syndrome: Secondary | ICD-10-CM | POA: Diagnosis not present

## 2022-01-07 DIAGNOSIS — E1140 Type 2 diabetes mellitus with diabetic neuropathy, unspecified: Secondary | ICD-10-CM | POA: Diagnosis not present

## 2022-01-07 DIAGNOSIS — Z9229 Personal history of other drug therapy: Secondary | ICD-10-CM | POA: Diagnosis not present

## 2022-01-07 DIAGNOSIS — I10 Essential (primary) hypertension: Secondary | ICD-10-CM | POA: Diagnosis not present

## 2022-02-03 DIAGNOSIS — G894 Chronic pain syndrome: Secondary | ICD-10-CM | POA: Diagnosis not present

## 2022-02-25 ENCOUNTER — Other Ambulatory Visit: Payer: Self-pay | Admitting: *Deleted

## 2022-02-25 NOTE — Patient Instructions (Signed)
Visit Information ? ?Thank you for taking time to visit with me today. Please don't hesitate to contact me if I can be of assistance to you before our next scheduled telephone appointment. ? ?Following are the goals we discussed today:  ?Current Barriers:  ?Knowledge Deficits related to plan of care for management of HTN  ? ?RNCM Clinical Goal(s):  ?Patient will verbalize understanding of plan for management of HTN as evidenced by continuation of monitoring blood pressure and adhering to low sodium diet through collaboration with RN Care manager, provider, and care team.  ? ?Interventions: ?Inter-disciplinary care team collaboration (see longitudinal plan of care) ?Evaluation of current treatment plan related to  self management and patient's adherence to plan as established by provider ? ?Patient Goals/Self-Care Activities: ?Take medications as prescribed   ?Attend all scheduled provider appointments ?Call pharmacy for medication refills 3-7 days in advance of running out of medications ?Attend church or other social activities ?Perform all self care activities independently  ?Perform IADL's (shopping, preparing meals, housekeeping, managing finances) independently ?Call provider office for new concerns or questions  ?call the Suicide and Crisis Lifeline: 988 if experiencing a Mental Health or [place]  ?check blood pressure 3 times per week ?choose a place to take my blood pressure (home, clinic or office, retail store) ?learn about high blood pressure ?take blood pressure log to all doctor appointments ?call doctor for signs and symptoms of high blood pressure ?develop an action plan for high blood pressure ?keep all doctor appointments ?take medications for blood pressure exactly as prescribed ?report new symptoms to your doctor ?limit salt intake to 2300 mg/day ? [id number] Patient blood pressure reading 145/73-111/62 better controlled. Per patient her appetite is fair. Her weight is 174 pounds.  Per patient she has not been sleeping at night due to being the caregiver of her husband. She has not been exercising. Patient stated that she has been doing a lot of yard work. Her knee pain is better. Her back is still hurting and she takes pain med. She has not had any recent falls. Patient stated she liked planting flowers, crafts to decrease the stress.  ? ?Our next appointment is by telephone on May 27, 2022 ? ?Please call Johny Shock RN [phone]  if you need to cancel or reschedule your appointment.  ? ?Please call the Suicide and Crisis Lifeline: 988 if you are experiencing a Mental Health or [place] or need someone to talk to. ? ?The patient verbalized understanding of instructions, educational materials, and care plan provided today and agreed to receive a mailed copy of patient instructions, educational materials, and care plan.  ? ?Telephone follow up appointment with care management team member scheduled for: ?The patient has been provided with contact information for the care management team and has been advised to call with any health related questions or concerns.  ? ?SIGNATURE ? ?Johny Shock BSN RN ?[place] Management ?[phone] ? ? ?  ?

## 2022-02-25 NOTE — Patient Outreach (Incomplete)
[facility]) Care Management ?RN Health Coach Note ? ? ?02/25/2022 ?Name:  [name] MRN:  [id number] DOB:  December 06, 1945 ? ?Summary: ?Patient has been monitoring her blood pressure at home.  Her range is 145/73-111/62. Her blood pressure is much better. Patient has not been exercising.  She does a lot of yard work. She is having a lot of stress being the caregiver of her husband.  ? ?Recommendations/Changes made from today's visit: ?Monitor blood pressure ?Monitor sodium in diet ?Remember to take time for yourself ? ?Subjective: ?[name] is an 76 y.o. year old female who is a primary patient of Redmond School, MD. The care management team was consulted for assistance with care management and/or care coordination needs.   ? ?RN Health Coach completed Telephone Visit today.  ? ?Objective: ? ?Medications Reviewed Today   ? ? Reviewed by Verlin Grills, RN (Case Manager) on 06/28/21 at [place] List Status: <None>  ? ?Medication Order Taking? Sig Documenting Provider Last Dose Status Informant  ?acyclovir (ZOVIRAX) 400 MG tablet [id number] No Take 400 mg by mouth 2 (two) times daily.  [provider] Taking Active Multiple Informants  ?amLODipine (NORVASC) 10 MG tablet [id number] No Take 1 tablet (10 mg total) by mouth daily. Lorretta Harp, MD Taking Active   ?aspirin EC 81 MG tablet [id number] No Take 81 mg by mouth daily. [provider] Taking Active Multiple Informants  ?atorvastatin (LIPITOR) 40 MG tablet [id number] No Take 40 mg by mouth daily. [provider] Taking Active   ?B Complex-C (B-COMPLEX WITH VITAMIN C) tablet [id number] No Take 1 tablet by mouth daily. [provider] Taking Active Multiple Informants  ?Biotin 5 MG CAPS [id number] No Take 1 capsule by mouth daily. [provider] Taking Active Multiple Informants  ?celecoxib (CELEBREX) 200 MG capsule [id number] No Take 1 capsule by mouth daily. [provider] Taking  Active   ?DULoxetine (CYMBALTA) 30 MG capsule [id number] No Take 1 capsule by mouth daily. [provider] Taking Active Multiple Informants  ?EPINEPHrine 0.3 mg/0.3 mL IJ SOAJ injection [id number] No Use as needed [provider] Taking Active   ?fluticasone (FLONASE) 50 MCG/ACT nasal spray [id number] No Place 2 sprays into both nostrils as needed for allergies.  [provider] Taking Active Multiple Informants  ?furosemide (LASIX) 20 MG tablet [id number] No Take 20-40 mg by mouth 2 (two) times daily. Take 40 mg in the morning and 20 mg in the evening [provider] Taking Active Multiple Informants  ?         ?Med Note Regino Bellow Oct 03, 2019  1:13 PM) Takes every other day ?  ?hydrochlorothiazide (MICROZIDE) 12.5 MG capsule [id number] No TAKE 1 CAPSULE BY MOUTH  DAILY  ?Patient not taking: Reported on 05/07/2021  ? Lorretta Harp, MD Not Taking Active   ?HYDROcodone-acetaminophen (NORCO) 10-325 MG tablet [id number] No Take 1 tablet by mouth every 4 (four) hours.  ?Patient not taking: Reported on 05/07/2021  ? [provider] Not Taking Active Multiple Informants  ?lisinopril (PRINIVIL,ZESTRIL) 40 MG tablet [id number] No Take 40 mg by mouth daily. [provider] Taking Active Multiple Informants  ?Magnesium 250 MG TABS [id number] No Take 1 tablet by mouth daily. [provider] Taking Active Multiple Informants  ?methocarbamol (ROBAXIN) 500 MG tablet [id number] No Take 1 tablet by mouth every 8 (eight) hours as needed. [provider] Taking Active   ?Multiple Vitamins-Minerals (PRESERVISION  AREDS 2 PO) [id number] No Take 2 tablets by mouth daily.  [provider] Taking Active Multiple Informants  ?MYRBETRIQ 50 MG TB24 tablet [id number] No Take 50 mg by mouth daily. [provider] Taking Active   ?oxyCODONE (ROXICODONE) 15 MG immediate release tablet [id number] No  [provider] Taking Active   ?Potassium Citrate  99 MG CAPS [id number] No Take 1 capsule by mouth daily. [provider] Taking Active Multiple Informants  ?         ?Med Note Corinna Lines Nov 18, 2019  1:44 PM) PRN  ?pyridostigmine (MESTINON) 60 MG tablet [id number] No Take 60 mg by mouth 3 (three) times daily. [provider] Taking Active   ?pyridOXINE (VITAMIN B-6) 100 MG tablet [id number] No Take 100 mg by mouth daily. [provider] Taking Active Multiple Informants  ?traMADol (ULTRAM) 50 MG tablet [id number] No TAKE 1 2 TABLETS BY MOUTH 4 TIMES A DAY AS NEEDED PAIN, MAY ADD TO HYDROCODONE IF NEEDED [provider] Taking Active   ?traZODone (DESYREL) 150 MG tablet [id number] No Take 150 mg by mouth at bedtime. [provider] Taking Active Multiple Informants  ?         ?Med Note Jamey Reas   Wed Nov 11, 2015  5:02 PM)   ?  ?vitamin B-12 (CYANOCOBALAMIN) 1000 MCG tablet [id number] No Take 2,500 mcg by mouth daily. [provider] Taking Active Multiple Informants  ?Vitamins-Lipotropics (EAR HEALTH PLUS PO) [id number] No Take 1 tablet by mouth daily. [provider] Taking Active Multiple Informants  ? ?  ?  ? ?  ? ? ? ?SDOH:  (Social Determinants of Health) assessments and interventions performed:  ?SDOH Interventions   ? ?Flowsheet Row Most Recent Value  ?SDOH Interventions   ?Food Insecurity Interventions Intervention Not Indicated  ?Housing Interventions Intervention Not Indicated  ?Stress Interventions Other (Comment)  [some family helping and spiritual help/ doing some crafting for therapy]  ?Transportation Interventions Intervention Not Indicated  ? ?  ? ? ?Care Plan ? ?Review of patient past medical history, allergies, medications, health status, including review of consultants reports, laboratory and other test data, was performed as part of comprehensive evaluation for care management services.  ? ?Care Plan : RN Care Manager Plan of Care  ?Updates made by Kriya Westra, Eppie Gibson, RN since 02/25/2022 12:00 AM  ?  ? ?Problem: Knowledge Deficit Related to Hypertension and Care Coordination needs   ?Priority: High  ?  ? ?Long-Range Goal: Development Plan of Care for Management of Hypertension   ?Start Date: 09/29/2021  ?Expected End Date: 10/29/2022  ?Priority: High  ?Note:   ?Current Barriers:  ?Knowledge Deficits related to plan of care for management of HTN  ? ?RNCM Clinical Goal(s):  ?Patient will verbalize understanding of plan for management of HTN as evidenced by continuation of monitoring blood pressure and adhering to low sodium diet  through collaboration with RN Care manager, provider, and care team.  ? ?Interventions: ?Inter-disciplinary care team collaboration (see longitudinal plan of care) ?Evaluation of current treatment plan related to  self management and patient's adherence to plan as established by provider ? ?Patient Goals/Self-Care Activities: ?Take medications as prescribed   ?Attend all scheduled provider appointments ?Call pharmacy for medication refills 3-7 days in advance of running out of medications ?Attend church or other social activities ?Perform all self care activities independently  ?Perform IADL's (shopping, preparing meals, housekeeping, managing finances) independently ?Call provider office for  new concerns or questions  ?call the Suicide and Crisis Lifeline: 988 if experiencing a Mental Health or [place]  ?check blood pressure 3 times per week ?choose a place to take my blood pressure (home, clinic or office, retail store) ?learn about high blood pressure ?take blood pressure log to all doctor appointments ?call doctor for signs and symptoms of high blood pressure ?develop an action plan for high blood pressure ?keep all doctor appointments ?take medications for blood pressure exactly as prescribed ?report new symptoms to your doctor ?limit salt intake to 2300 mg/day ? [id number] Patient blood pressure reading 145/73-111/62 better  controlled. Per patient her appetite is fair. Her weight is 174 pounds. Per patient she has not been sleeping at night due to being the caregiver of her husband. She has not been exercising. Patient stated that

## 2022-02-28 NOTE — Patient Outreach (Deleted)
[facility]) Care Management ? ?02/28/2022 ? ?[name] ?[birth year] ?[id number] ? ? ? ?

## 2022-02-28 NOTE — Patient Outreach (Signed)
[place] [facility]) Care Management ?RN Health Coach Note ? ? ?02/28/2022 ?Name:  [name] MRN:  [id number] DOB:  [birth year] ? ?Summary: ?Patient blood pressure readings are better controlled. Her range is 145/73-111/62. Per patient she is not resting very well. She stated that her husband wakes her up every night hollering several times. Patient stated she is under a lot of stress. She is doing yard work and Management consultant for stress relief.  Per patient she has not been exercising. Her back is still painful in which she takes pain medications. Her knee pain has gotten better.  ? ?Recommendations/Changes made from today's visit: ?Monitor blood pressure ?Medication adherence ?Adhere to diabetic low sodium diet ? ?Subjective: ?[name] is an 76 y.o. year old female who is a primary patient of Redmond School, MD. The care management team was consulted for assistance with care management and/or care coordination needs.   ? ?RN Health Coach completed Telephone Visit today.  ? ?Objective: ? ?Medications Reviewed Today   ? ? Reviewed by Verlin Grills, RN (Case Manager) on 06/28/21 at [place] List Status: <None>  ? ?Medication Order Taking? Sig Documenting Provider Last Dose Status Informant  ?acyclovir (ZOVIRAX) 400 MG tablet [id number] No Take 400 mg by mouth 2 (two) times daily.  [provider] Taking Active Multiple Informants  ?amLODipine (NORVASC) 10 MG tablet [id number] No Take 1 tablet (10 mg total) by mouth daily. Lorretta Harp, MD Taking Active   ?aspirin EC 81 MG tablet [id number] No Take 81 mg by mouth daily. [provider] Taking Active Multiple Informants  ?atorvastatin (LIPITOR) 40 MG tablet [id number] No Take 40 mg by mouth daily. [provider] Taking Active   ?B Complex-C (B-COMPLEX WITH VITAMIN C) tablet [id number] No Take 1 tablet by mouth daily. [provider] Taking Active Multiple Informants  ?Biotin 5 MG CAPS [id number] No Take 1  capsule by mouth daily. [provider] Taking Active Multiple Informants  ?celecoxib (CELEBREX) 200 MG capsule [id number] No Take 1 capsule by mouth daily. [provider] Taking Active   ?DULoxetine (CYMBALTA) 30 MG capsule [id number] No Take 1 capsule by mouth daily. [provider] Taking Active Multiple Informants  ?EPINEPHrine 0.3 mg/0.3 mL IJ SOAJ injection [id number] No Use as needed [provider] Taking Active   ?fluticasone (FLONASE) 50 MCG/ACT nasal spray [id number] No Place 2 sprays into both nostrils as needed for allergies.  [provider] Taking Active Multiple Informants  ?furosemide (LASIX) 20 MG tablet [id number] No Take 20-40 mg by mouth 2 (two) times daily. Take 40 mg in the morning and 20 mg in the evening [provider] Taking Active Multiple Informants  ?         ?Med Note Regino Bellow Oct 03, 2019  1:13 PM) Takes every other day ?  ?hydrochlorothiazide (MICROZIDE) 12.5 MG capsule [id number] No TAKE 1 CAPSULE BY MOUTH  DAILY  ?Patient not taking: Reported on 05/07/2021  ? Lorretta Harp, MD Not Taking Active   ?HYDROcodone-acetaminophen (NORCO) 10-325 MG tablet [id number] No Take 1 tablet by mouth every 4 (four) hours.  ?Patient not taking: Reported on 05/07/2021  ? [provider] Not Taking Active Multiple Informants  ?lisinopril (PRINIVIL,ZESTRIL) 40 MG tablet [id number] No Take 40 mg by mouth daily. [provider] Taking Active Multiple Informants  ?Magnesium 250 MG TABS [id number] No Take 1 tablet by mouth daily. [provider] Taking Active Multiple Informants  ?  methocarbamol (ROBAXIN) 500 MG tablet [id number] No Take 1 tablet by mouth every 8 (eight) hours as needed. [provider] Taking Active   ?Multiple Vitamins-Minerals (PRESERVISION AREDS 2 PO) [id number] No Take 2 tablets by mouth daily.  [provider] Taking Active Multiple Informants  ?MYRBETRIQ 50 MG TB24 tablet [id number] No  Take 50 mg by mouth daily. [provider] Taking Active   ?oxyCODONE (ROXICODONE) 15 MG immediate release tablet [id number] No  [provider] Taking Active   ?Potassium Citrate 99 MG CAPS [id number] No Take 1 capsule by mouth daily. [provider] Taking Active Multiple Informants  ?         ?Med Note Corinna Lines Nov 18, 2019  1:44 PM) PRN  ?pyridostigmine (MESTINON) 60 MG tablet [id number] No Take 60 mg by mouth 3 (three) times daily. [provider] Taking Active   ?pyridOXINE (VITAMIN B-6) 100 MG tablet [id number] No Take 100 mg by mouth daily. [provider] Taking Active Multiple Informants  ?traMADol (ULTRAM) 50 MG tablet [id number] No TAKE 1 2 TABLETS BY MOUTH 4 TIMES A DAY AS NEEDED PAIN, MAY ADD TO HYDROCODONE IF NEEDED [provider] Taking Active   ?traZODone (DESYREL) 150 MG tablet [id number] No Take 150 mg by mouth at bedtime. [provider] Taking Active Multiple Informants  ?         ?Med Note Jamey Reas   Wed Nov 11, 2015  5:02 PM)   ?  ?vitamin B-12 (CYANOCOBALAMIN) 1000 MCG tablet [id number] No Take 2,500 mcg by mouth daily. [provider] Taking Active Multiple Informants  ?Vitamins-Lipotropics (EAR HEALTH PLUS PO) [id number] No Take 1 tablet by mouth daily. [provider] Taking Active Multiple Informants  ? ?  ?  ? ?  ? ? ? ?SDOH:  (Social Determinants of Health) assessments and interventions performed:  ?SDOH Interventions   ? ?Flowsheet Row Most Recent Value  ?SDOH Interventions   ?Food Insecurity Interventions Intervention Not Indicated  ?Housing Interventions Intervention Not Indicated  ?Stress Interventions Other (Comment)  [some family helping and spiritual help/ doing some crafting for therapy]  ?Transportation Interventions Intervention Not Indicated  ? ?  ? ? ?Care Plan ? ?Review of patient past medical history, allergies, medications, health status, including review of consultants  reports, laboratory and other test data, was performed as part of comprehensive evaluation for care management services.  ? ?Care Plan : RN Care Manager Plan of Care  ?Updates made by Loui Massenburg, Eppie Gibson, RN since 02/28/2022 12:00 AM  ?  ? ?Problem: Knowledge Deficit Related to Hypertension and Care Coordination needs   ?Priority: High  ?  ? ?Long-Range Goal: Development Plan of Care for Management of Hypertension   ?Start Date: 09/29/2021  ?Expected End Date: 10/29/2022  ?Priority: High  ?Note:   ?Current Barriers:  ?Knowledge Deficits related to plan of care for management of HTN  ? ?RNCM Clinical Goal(s):  ?Patient will verbalize understanding of plan for management of HTN as evidenced by continuation of monitoring blood pressure and adhering to low sodium diet  through collaboration with RN Care manager, provider, and care team.  ? ?Interventions: ?Inter-disciplinary care team collaboration (see longitudinal plan of care) ?Evaluation of current treatment plan related to  self management and patient's adherence to plan as established by provider ? ?Patient Goals/Self-Care Activities: ?Take medications as prescribed   ?Attend all scheduled provider appointments ?Call pharmacy for medication refills 3-7 days in advance of running out  of medications ?Attend church or other social activities ?Perform all self care activities independently  ?Perform IADL's (shopping, preparing meals, housekeeping, managing finances) independently ?Call provider office for new concerns or questions  ?call the Suicide and Crisis Lifeline: 988 if experiencing a Mental Health or [place]  ?check blood pressure 3 times per week ?choose a place to take my blood pressure (home, clinic or office, retail store) ?learn about high blood pressure ?take blood pressure log to all doctor appointments ?call doctor for signs and symptoms of high blood pressure ?develop an action plan for high blood pressure ?keep all doctor  appointments ?take medications for blood pressure exactly as prescribed ?report new symptoms to your doctor ?limit salt intake to 2300 mg/day ? [id number] Patient blood pressure reading 145/73-111/62 better controlled. [place]

## 2022-03-08 DIAGNOSIS — I10 Essential (primary) hypertension: Secondary | ICD-10-CM | POA: Diagnosis not present

## 2022-03-08 DIAGNOSIS — E1140 Type 2 diabetes mellitus with diabetic neuropathy, unspecified: Secondary | ICD-10-CM | POA: Diagnosis not present

## 2022-03-08 DIAGNOSIS — G894 Chronic pain syndrome: Secondary | ICD-10-CM | POA: Diagnosis not present

## 2022-03-08 DIAGNOSIS — M1711 Unilateral primary osteoarthritis, right knee: Secondary | ICD-10-CM | POA: Diagnosis not present

## 2022-03-22 DIAGNOSIS — H00024 Hordeolum internum left upper eyelid: Secondary | ICD-10-CM | POA: Diagnosis not present

## 2022-04-08 DIAGNOSIS — G894 Chronic pain syndrome: Secondary | ICD-10-CM | POA: Diagnosis not present

## 2022-04-08 DIAGNOSIS — M1991 Primary osteoarthritis, unspecified site: Secondary | ICD-10-CM | POA: Diagnosis not present

## 2022-04-08 DIAGNOSIS — I10 Essential (primary) hypertension: Secondary | ICD-10-CM | POA: Diagnosis not present

## 2022-04-08 DIAGNOSIS — J029 Acute pharyngitis, unspecified: Secondary | ICD-10-CM | POA: Diagnosis not present

## 2022-04-08 DIAGNOSIS — J329 Chronic sinusitis, unspecified: Secondary | ICD-10-CM | POA: Diagnosis not present

## 2022-04-08 DIAGNOSIS — I700 Atherosclerosis of aorta: Secondary | ICD-10-CM | POA: Diagnosis not present

## 2022-04-08 DIAGNOSIS — E1165 Type 2 diabetes mellitus with hyperglycemia: Secondary | ICD-10-CM | POA: Diagnosis not present

## 2022-05-09 DIAGNOSIS — M21371 Foot drop, right foot: Secondary | ICD-10-CM | POA: Diagnosis not present

## 2022-05-09 DIAGNOSIS — I10 Essential (primary) hypertension: Secondary | ICD-10-CM | POA: Diagnosis not present

## 2022-05-09 DIAGNOSIS — G894 Chronic pain syndrome: Secondary | ICD-10-CM | POA: Diagnosis not present

## 2022-05-09 DIAGNOSIS — I700 Atherosclerosis of aorta: Secondary | ICD-10-CM | POA: Diagnosis not present

## 2022-05-09 DIAGNOSIS — E1165 Type 2 diabetes mellitus with hyperglycemia: Secondary | ICD-10-CM | POA: Diagnosis not present

## 2022-05-16 ENCOUNTER — Other Ambulatory Visit: Payer: Self-pay | Admitting: *Deleted

## 2022-05-16 NOTE — Patient Outreach (Signed)
[facility]) Care Management  05/16/2022  KEIRSTIN MUSIL 30-May-1946 [id number]  RN Health Coach attempted follow up outreach call to patient.  Patient was unavailable. HIPPA compliance voicemail message left with return callback number.  Plan: RN will call patient again within 30 days.  [place] Care Management [phone]

## 2022-05-19 ENCOUNTER — Ambulatory Visit: Payer: Self-pay | Admitting: *Deleted

## 2022-05-19 ENCOUNTER — Ambulatory Visit: Payer: Medicare Other | Admitting: Neurosurgery

## 2022-05-19 NOTE — Patient Outreach (Signed)
[facility]) Care Management  05/19/2022  [name] 09-29-1946 [id number]  RN Health Coach telephone call to patient.  Hipaa compliance verified. [id number] Per patient her blood pressure is doing better. No actual readings. Patient stated her foot feels numb sometime and not moving. She has to be aware of it. When sitting she keeps moving it so it doesn't go to sleep. She has put a call into PCP and she will follow up again since she hasn't heard back. Patient is under a lot of stress with care of husband. RN Health Coach will close case. Care Coordinator with continue to follow care.   [place] Care Management [phone]

## 2022-05-27 ENCOUNTER — Ambulatory Visit: Payer: Medicare Other | Admitting: *Deleted

## 2022-06-09 DIAGNOSIS — E1140 Type 2 diabetes mellitus with diabetic neuropathy, unspecified: Secondary | ICD-10-CM | POA: Diagnosis not present

## 2022-06-09 DIAGNOSIS — I10 Essential (primary) hypertension: Secondary | ICD-10-CM | POA: Diagnosis not present

## 2022-06-09 DIAGNOSIS — G894 Chronic pain syndrome: Secondary | ICD-10-CM | POA: Diagnosis not present

## 2022-06-09 DIAGNOSIS — I700 Atherosclerosis of aorta: Secondary | ICD-10-CM | POA: Diagnosis not present

## 2022-06-10 DIAGNOSIS — R208 Other disturbances of skin sensation: Secondary | ICD-10-CM | POA: Diagnosis not present

## 2022-06-10 DIAGNOSIS — M79605 Pain in left leg: Secondary | ICD-10-CM | POA: Diagnosis not present

## 2022-06-10 DIAGNOSIS — M545 Low back pain, unspecified: Secondary | ICD-10-CM | POA: Diagnosis not present

## 2022-06-10 DIAGNOSIS — R2689 Other abnormalities of gait and mobility: Secondary | ICD-10-CM | POA: Diagnosis not present

## 2022-07-06 ENCOUNTER — Ambulatory Visit: Payer: Self-pay | Admitting: *Deleted

## 2022-07-06 NOTE — Patient Outreach (Signed)
  Care Coordination   07/06/2022 Name: [name] MRN: [id number] DOB: 1946/03/23   Care Coordination Outreach Attempts:  An unsuccessful telephone outreach was attempted for a scheduled appointment today.  Follow Up Plan:  Additional outreach attempts will be made to offer the patient care coordination information and services.   Encounter Outcome:  No Answer  Care Coordination Interventions Activated:  No   Care Coordination Interventions:  No, not indicated    Valente David, RN, MSN, [facility] Care Management Care Management Coordinator [phone]

## 2022-07-08 DIAGNOSIS — I700 Atherosclerosis of aorta: Secondary | ICD-10-CM | POA: Diagnosis not present

## 2022-07-08 DIAGNOSIS — G894 Chronic pain syndrome: Secondary | ICD-10-CM | POA: Diagnosis not present

## 2022-07-08 DIAGNOSIS — I10 Essential (primary) hypertension: Secondary | ICD-10-CM | POA: Diagnosis not present

## 2022-07-08 DIAGNOSIS — E1165 Type 2 diabetes mellitus with hyperglycemia: Secondary | ICD-10-CM | POA: Diagnosis not present

## 2022-07-08 DIAGNOSIS — Z23 Encounter for immunization: Secondary | ICD-10-CM | POA: Diagnosis not present

## 2022-07-14 ENCOUNTER — Ambulatory Visit: Payer: Self-pay | Admitting: *Deleted

## 2022-07-14 ENCOUNTER — Encounter: Payer: Self-pay | Admitting: *Deleted

## 2022-07-14 NOTE — Patient Outreach (Signed)
  Care Coordination   Initial Visit Note   07/14/2022 Name: [name] MRN: [id number] DOB: April 26, 1946  [name] is a 76 y.o. year old female who sees Redmond School, MD for primary care. I spoke with  [name] by phone today.  What matters to the patients health and wellness today?  State her biggest concern currently is back pain. State she was seen by ortho a couple years ago, surgery was recommended but insurance didn't approve.  Has been dealing with chronic pain since.  She will call ortho for follow up to inquire about current recommendations and see if insurance will approve surgery.  Monthly appointments with PCP for pain management. Denies any urgent concerns, encouraged to contact this care manager with questions.      Goals Addressed               This Visit's Progress     Relief of back pain (pt-stated)        Care Coordination Interventions: Reviewed provider established plan for pain management Discussed importance of adherence to all scheduled medical appointments Counseled on the importance of reporting any/all new or changed pain symptoms or management strategies to pain management provider Discussed use of relaxation techniques and/or diversional activities to assist with pain reduction (distraction, imagery, relaxation, massage, acupressure, TENS, heat, and cold application Advised patient to discuss ortho follow up with provider Assessed social determinant of health barriers Aware of need for AWV yearly Reviewed follow up with neuro, state scheduled for November          SDOH assessments and interventions completed:  Yes  SDOH Interventions Today    Flowsheet Row Most Recent Value  SDOH Interventions   Food Insecurity Interventions Intervention Not Indicated  Housing Interventions Intervention Not Indicated  Transportation Interventions Intervention Not Indicated  Utilities Interventions Intervention Not Indicated        Care  Coordination Interventions Activated:  Yes  Care Coordination Interventions:  Yes, provided   Follow up plan: Follow up call scheduled for 10/10    Encounter Outcome:  Pt. Visit Completed   Valente David, RN, MSN, [place] Care Management Care Management Coordinator [phone]

## 2022-07-14 NOTE — Patient Instructions (Signed)
Visit Information  Thank you for taking time to visit with me today. Please don't hesitate to contact me if I can be of assistance to you before our next scheduled telephone appointment.  Following are the goals we discussed today:  Call [facility] to schedule follow up appointment for chronic back pain  Our next appointment is by telephone on 10/10  Please call the care guide team at [phone] if you need to cancel or reschedule your appointment.   Please call the Suicide and Crisis Lifeline: 988 call the [place] National Suicide Prevention Lifeline: 435-212-3379 or TTY: (351)281-5657 TTY 717-403-7794) to talk to a trained counselor call 1-800-273-TALK (toll free, 24 hour hotline) call the [facility]: 9785933465 call 911 if you are experiencing a Mental Health or [place] or need someone to talk to.  Patient verbalizes understanding of instructions and care plan provided today and agrees to view in [place]. Active MyChart status and patient understanding of how to access instructions and care plan via MyChart confirmed with patient.     The patient has been provided with contact information for the care management team and has been advised to call with any health related questions or concerns.   Valente David, RN, MSN, [place] Care Management Care Management Coordinator [phone]

## 2022-08-04 DIAGNOSIS — G894 Chronic pain syndrome: Secondary | ICD-10-CM | POA: Diagnosis not present

## 2022-08-04 DIAGNOSIS — M1991 Primary osteoarthritis, unspecified site: Secondary | ICD-10-CM | POA: Diagnosis not present

## 2022-08-04 DIAGNOSIS — E1140 Type 2 diabetes mellitus with diabetic neuropathy, unspecified: Secondary | ICD-10-CM | POA: Diagnosis not present

## 2022-08-04 DIAGNOSIS — I10 Essential (primary) hypertension: Secondary | ICD-10-CM | POA: Diagnosis not present

## 2022-08-08 DIAGNOSIS — M545 Low back pain, unspecified: Secondary | ICD-10-CM | POA: Diagnosis not present

## 2022-08-08 DIAGNOSIS — R208 Other disturbances of skin sensation: Secondary | ICD-10-CM | POA: Diagnosis not present

## 2022-08-08 DIAGNOSIS — M79605 Pain in left leg: Secondary | ICD-10-CM | POA: Diagnosis not present

## 2022-08-08 DIAGNOSIS — M79604 Pain in right leg: Secondary | ICD-10-CM | POA: Diagnosis not present

## 2022-08-08 DIAGNOSIS — R2689 Other abnormalities of gait and mobility: Secondary | ICD-10-CM | POA: Diagnosis not present

## 2022-08-09 ENCOUNTER — Ambulatory Visit: Payer: Self-pay | Admitting: *Deleted

## 2022-08-09 NOTE — Patient Outreach (Signed)
  Care Coordination   08/09/2022 Name: [name] MRN: [id number] DOB: [birth year]   Care Coordination Outreach Attempts:  An unsuccessful telephone outreach was attempted for a scheduled appointment today.  Follow Up Plan:  Additional outreach attempts will be made to offer the patient care coordination information and services.   Encounter Outcome:  No Answer  Care Coordination Interventions Activated:  No   Care Coordination Interventions:  No, not indicated    Valente David, RN, MSN, [facility] Care Management Care Management Coordinator [phone]

## 2022-08-15 ENCOUNTER — Other Ambulatory Visit: Payer: Self-pay | Admitting: Internal Medicine

## 2022-08-15 DIAGNOSIS — Z1231 Encounter for screening mammogram for malignant neoplasm of breast: Secondary | ICD-10-CM

## 2022-08-24 ENCOUNTER — Telehealth: Payer: Self-pay | Admitting: *Deleted

## 2022-08-24 NOTE — Chronic Care Management (AMB) (Signed)
  Care Coordination Note  08/24/2022 Name: CHARLYN VIALPANDO MRN: [id number] DOB: 1946-06-09  KYLII ENNIS is a 76 y.o. year old female who is a primary care patient of Redmond School, MD and is actively engaged with the care management team. I reached out to [name] by phone today to assist with re-scheduling a follow up visit with the RN Case Manager  Follow up plan: Unsuccessful telephone outreach attempt made. A HIPAA compliant phone message was left for the patient providing contact information and requesting a return call.  [place]  Direct Dial: [phone]

## 2022-08-30 DIAGNOSIS — E1165 Type 2 diabetes mellitus with hyperglycemia: Secondary | ICD-10-CM | POA: Diagnosis not present

## 2022-08-30 DIAGNOSIS — I10 Essential (primary) hypertension: Secondary | ICD-10-CM | POA: Diagnosis not present

## 2022-08-31 NOTE — Chronic Care Management (AMB) (Signed)
  Care Coordination Note  08/31/2022 Name: [name] MRN: [id number] DOB: 08-17-1946  [name] is a 76 y.o. year old female who is a primary care patient of Redmond School, MD and is actively engaged with the care management team. I reached out to [name] by phone today to assist with re-scheduling a follow up visit with the RN Case Manager  Follow up plan: Telephone appointment with care management team member scheduled for:09/07/22  [place]: [phone]

## 2022-09-02 DIAGNOSIS — E119 Type 2 diabetes mellitus without complications: Secondary | ICD-10-CM | POA: Diagnosis not present

## 2022-09-02 DIAGNOSIS — I10 Essential (primary) hypertension: Secondary | ICD-10-CM | POA: Diagnosis not present

## 2022-09-02 DIAGNOSIS — G894 Chronic pain syndrome: Secondary | ICD-10-CM | POA: Diagnosis not present

## 2022-09-02 DIAGNOSIS — M1991 Primary osteoarthritis, unspecified site: Secondary | ICD-10-CM | POA: Diagnosis not present

## 2022-09-07 ENCOUNTER — Ambulatory Visit: Payer: Self-pay | Admitting: *Deleted

## 2022-09-07 NOTE — Patient Outreach (Signed)
  Care Coordination   09/07/2022 Name: [name] MRN: [id number] DOB: 07-05-1946   Care Coordination Outreach Attempts:  An unsuccessful telephone outreach was attempted for a scheduled appointment today.  Follow Up Plan:  Additional outreach attempts will be made to offer the patient care coordination information and services.   Encounter Outcome:  No Answer. "Call could not be completed as dialed"  Care Coordination Interventions Activated:  No   Care Coordination Interventions:  No, not indicated    Chong Sicilian, BSN, RN-BC RN Care Coordinator [place]: [phone] Main #: [phone]

## 2022-09-28 ENCOUNTER — Ambulatory Visit
Admission: RE | Admit: 2022-09-28 | Discharge: 2022-09-28 | Disposition: A | Discharge status: Home / Self Care | Payer: Medicare Other | Source: Ambulatory Visit | Attending: Internal Medicine | Admitting: Internal Medicine

## 2022-09-28 DIAGNOSIS — Z1231 Encounter for screening mammogram for malignant neoplasm of breast: Secondary | ICD-10-CM

## 2022-09-29 ENCOUNTER — Ambulatory Visit: Payer: Self-pay | Admitting: *Deleted

## 2022-09-29 NOTE — Patient Outreach (Signed)
  Care Coordination   09/29/2022 Name: [name] MRN: [id number] DOB: 1946-01-26   Care Coordination Outreach Attempts:  An unsuccessful telephone outreach was attempted for a scheduled appointment today. 3rd unsuccessful outreach for scheduled telephone visit.  Follow Up Plan:  No further outreach attempts will be made at this time. We have been unable to contact the patient to offer or enroll patient in care coordination services  Encounter Outcome:  No Answer   Care Coordination Interventions:  No, not indicated    Chong Sicilian, BSN, RN-BC RN Care Coordinator [place]: [phone] Main #: [phone]

## 2022-09-30 ENCOUNTER — Ambulatory Visit: Payer: Medicare Other | Admitting: *Deleted

## 2022-09-30 ENCOUNTER — Encounter: Payer: Self-pay | Admitting: *Deleted

## 2022-09-30 NOTE — Patient Outreach (Signed)
  Care Coordination   Follow Up Visit Note   09/30/2022 Name: [name] MRN: [id number] DOB: December 07, 1945  [name] is a 76 y.o. year old female who sees [facility] [employer], MD for primary care. I spoke with  [name] by phone today.  What matters to the patients health and wellness today?  Back pain management    Goals Addressed             This Visit's Progress    Pain Management       Care Coordination Interventions: Reviewed provider established plan for pain management Discussed importance of adherence to all scheduled medical appointments Counseled on the importance of reporting any/all new or changed pain symptoms or management strategies to pain management provider Advised patient to report to care team affect of pain on daily activities Discussed use of relaxation techniques and/or diversional activities to assist with pain reduction (distraction, imagery, relaxation, massage, acupressure, TENS, heat, and cold application Reviewed with patient prescribed pharmacological and nonpharmacological pain relief strategies Advised patient to discuss physical therapy or other pain improvement options with provider Assessed social determinant of health barriers Discussed that Dr Nelva Bush recommended back surgery but it was denied by insurance. No further recommendations were given and she isn't aware of any treatments she has to try and fail before surgery would be approved Discussed upcoming appt with Dr Gerarda Fraction in two weeks. Encouraged to talk with him about other options, like PT and see if he is aware if her insurance company recommended any additional treatments prior to approving surgery Discussed follow-up with Dr Nelva Bush. None planned at this time Provided with [facility] telephone number and encouraged to reach out as needed        SDOH assessments and interventions completed:  Yes  SDOH Interventions Today    Flowsheet Row Most Recent Value  SDOH Interventions    Housing Interventions Intervention Not Indicated  Transportation Interventions Intervention Not Indicated        Care Coordination Interventions:  Yes, provided   Follow up plan: Follow up call scheduled for 10/19/22    Encounter Outcome:  Pt. Visit Completed   Chong Sicilian, BSN, RN-BC RN Care Coordinator [place]: [phone] Main #: [phone]

## 2022-10-10 ENCOUNTER — Encounter: Payer: Self-pay | Admitting: Podiatry

## 2022-10-10 ENCOUNTER — Ambulatory Visit: Payer: Medicare Other | Admitting: Podiatry

## 2022-10-10 VITALS — BP 138/75

## 2022-10-10 DIAGNOSIS — I10 Essential (primary) hypertension: Secondary | ICD-10-CM | POA: Diagnosis not present

## 2022-10-10 DIAGNOSIS — E1140 Type 2 diabetes mellitus with diabetic neuropathy, unspecified: Secondary | ICD-10-CM | POA: Diagnosis not present

## 2022-10-10 DIAGNOSIS — M1991 Primary osteoarthritis, unspecified site: Secondary | ICD-10-CM | POA: Diagnosis not present

## 2022-10-10 DIAGNOSIS — G894 Chronic pain syndrome: Secondary | ICD-10-CM | POA: Diagnosis not present

## 2022-10-10 DIAGNOSIS — L600 Ingrowing nail: Secondary | ICD-10-CM | POA: Diagnosis not present

## 2022-10-10 NOTE — Patient Instructions (Signed)

## 2022-10-12 NOTE — Progress Notes (Signed)
Subjective:   Patient ID: [name], female   DOB: 76 y.o.   MRN: [id number]   HPI Patient presents with a lot of pain in the right hallux nail medial border stating that it has been sore and making it hard to wear shoe gear.  States it has been going on for couple of months and she is tried to trim it and soak it   ROS      Objective:  Physical Exam  Ocular status intact with patient found to have incurvated medial border right hallux that is painful when pressed making shoe gear difficult     Assessment:  Ingrown toenail deformity right hallux medial border with pain     Plan:  H&P reviewed condition recommended correction of deformity explained procedure risk and patient wants procedure understanding risk and signed consent form.  Today I infiltrated right hallux 60 mg like Marcaine mixture sterile prep done using sterile instrumentation remove the border exposed matrix applied phenol 3 applications 30 seconds followed by alcohol lavage sterile dressing gave instructions on soaks and to wear dressing 24 hours but take it off earlier if throbbing were to occur.  Encouraged her to call questions concerns which may come up

## 2022-10-19 ENCOUNTER — Ambulatory Visit: Payer: Self-pay | Admitting: *Deleted

## 2022-10-19 ENCOUNTER — Encounter: Payer: Self-pay | Admitting: *Deleted

## 2022-10-19 NOTE — Patient Outreach (Signed)
  Care Coordination   Follow Up Visit Note   10/19/2022 Name: [name] MRN: [id number] DOB: 26-Feb-1946  [name] is a 76 y.o. year old female who sees [facility] [employer], MD for primary care. I spoke with  [name] by phone today.  What matters to the patients health and wellness today?  Pain Management    Goals Addressed             This Visit's Progress    Pain Management       Care Coordination Interventions: Reviewed provider established plan for pain management Discussed importance of adherence to all scheduled medical appointments Counseled on the importance of reporting any/all new or changed pain symptoms or management strategies to pain management provider Advised patient to report to care team affect of pain on daily activities Discussed use of relaxation techniques and/or diversional activities to assist with pain reduction (distraction, imagery, relaxation, massage, acupressure, TENS, heat, and cold application Reviewed with patient prescribed pharmacological and nonpharmacological pain relief strategies Advised patient to discuss physical therapy or other pain improvement options with provider Assessed social determinant of health barriers Discussed that Dr Nelva Bush recommended back surgery but it was denied by insurance. No further recommendations were given and she isn't aware of any treatments she has to try and fail before surgery would be approved Discussed upcoming appt with Dr Gerarda Fraction on 11/09/22. Encouraged to talk with him about other options, like PT and see if he is aware if her insurance company recommended any additional treatments prior to approving surgery Discussed current pain. Patient reports that it has improved and she is taking  Provided with [facility] telephone number and encouraged to reach out as needed Assessed for additional care coordination or resource needs. None at this time.  Encouraged to reach out to [facility] as needed        SDOH  assessments and interventions completed:  Yes SDOH Interventions Today    Flowsheet Row Most Recent Value  SDOH Interventions   Housing Interventions Intervention Not Indicated  Physical Activity Interventions Intervention Not Indicated  [Unable to exercise right now due to back pain]      Care Coordination Interventions:  Yes, provided   Follow up plan: Follow up call scheduled for 11/16/22    Encounter Outcome:  Pt. Visit Completed   Chong Sicilian, BSN, RN-BC RN Care Coordinator [place]: [phone] Main #: [phone]

## 2022-11-09 DIAGNOSIS — E1140 Type 2 diabetes mellitus with diabetic neuropathy, unspecified: Secondary | ICD-10-CM | POA: Diagnosis not present

## 2022-11-09 DIAGNOSIS — E559 Vitamin D deficiency, unspecified: Secondary | ICD-10-CM | POA: Diagnosis not present

## 2022-11-09 DIAGNOSIS — G894 Chronic pain syndrome: Secondary | ICD-10-CM | POA: Diagnosis not present

## 2022-11-09 DIAGNOSIS — E1165 Type 2 diabetes mellitus with hyperglycemia: Secondary | ICD-10-CM | POA: Diagnosis not present

## 2022-11-09 DIAGNOSIS — I10 Essential (primary) hypertension: Secondary | ICD-10-CM | POA: Diagnosis not present

## 2022-11-09 DIAGNOSIS — E039 Hypothyroidism, unspecified: Secondary | ICD-10-CM | POA: Diagnosis not present

## 2022-11-09 DIAGNOSIS — M1991 Primary osteoarthritis, unspecified site: Secondary | ICD-10-CM | POA: Diagnosis not present

## 2022-11-09 DIAGNOSIS — Z0001 Encounter for general adult medical examination with abnormal findings: Secondary | ICD-10-CM | POA: Diagnosis not present

## 2022-11-09 DIAGNOSIS — D518 Other vitamin B12 deficiency anemias: Secondary | ICD-10-CM | POA: Diagnosis not present

## 2022-11-09 DIAGNOSIS — I700 Atherosclerosis of aorta: Secondary | ICD-10-CM | POA: Diagnosis not present

## 2022-11-16 ENCOUNTER — Ambulatory Visit: Payer: Self-pay | Admitting: *Deleted

## 2022-11-16 NOTE — Patient Outreach (Signed)
  Care Coordination   11/16/2022 Name: [name] MRN: [id number] DOB: 1946/06/12   Care Coordination Outreach Attempts:  An unsuccessful telephone outreach was attempted for a scheduled appointment today. Third unsuccessful telephone follow-up. IT ticket placed due to inability to call patient's home number through [place].   Follow Up Plan:  Additional outreach attempts will be made to offer the patient care coordination information and services.   Encounter Outcome:  No Answer   Care Coordination Interventions:  No, not indicated    Chong Sicilian, BSN, RN-BC RN Care Coordinator [place]: [phone] Main #: [phone]

## 2022-11-22 ENCOUNTER — Emergency Department (HOSPITAL_COMMUNITY): Payer: Medicare Other

## 2022-11-22 ENCOUNTER — Emergency Department (HOSPITAL_COMMUNITY)
Admission: EM | Admit: 2022-11-22 | Discharge: 2022-11-22 | Disposition: A | Discharge status: Home / Self Care | Payer: Medicare Other | Attending: Emergency Medicine | Admitting: Emergency Medicine

## 2022-11-22 DIAGNOSIS — Z7982 Long term (current) use of aspirin: Secondary | ICD-10-CM | POA: Diagnosis not present

## 2022-11-22 DIAGNOSIS — E119 Type 2 diabetes mellitus without complications: Secondary | ICD-10-CM | POA: Insufficient documentation

## 2022-11-22 DIAGNOSIS — G8911 Acute pain due to trauma: Secondary | ICD-10-CM | POA: Diagnosis not present

## 2022-11-22 DIAGNOSIS — R0789 Other chest pain: Secondary | ICD-10-CM | POA: Diagnosis not present

## 2022-11-22 DIAGNOSIS — I77810 Thoracic aortic ectasia: Secondary | ICD-10-CM | POA: Diagnosis not present

## 2022-11-22 DIAGNOSIS — I700 Atherosclerosis of aorta: Secondary | ICD-10-CM | POA: Diagnosis not present

## 2022-11-22 DIAGNOSIS — I10 Essential (primary) hypertension: Secondary | ICD-10-CM | POA: Diagnosis not present

## 2022-11-22 DIAGNOSIS — S299XXA Unspecified injury of thorax, initial encounter: Secondary | ICD-10-CM | POA: Diagnosis not present

## 2022-11-22 DIAGNOSIS — R0781 Pleurodynia: Secondary | ICD-10-CM | POA: Diagnosis not present

## 2022-11-22 DIAGNOSIS — Z743 Need for continuous supervision: Secondary | ICD-10-CM | POA: Diagnosis not present

## 2022-11-22 DIAGNOSIS — T07XXXA Unspecified multiple injuries, initial encounter: Secondary | ICD-10-CM | POA: Diagnosis not present

## 2022-11-22 DIAGNOSIS — D3501 Benign neoplasm of right adrenal gland: Secondary | ICD-10-CM | POA: Diagnosis not present

## 2022-11-22 DIAGNOSIS — W19XXXA Unspecified fall, initial encounter: Secondary | ICD-10-CM | POA: Diagnosis not present

## 2022-11-22 MED ORDER — DICLOFENAC EPOLAMINE 1.3 % EX PTCH
1.0000 | MEDICATED_PATCH | Freq: Two times a day (BID) | CUTANEOUS | Status: DC
  Administered 2022-11-22: 1 via TRANSDERMAL
  Filled 2022-11-22: qty 1

## 2022-11-22 MED ORDER — METHYL SALICYLATE-LIDO-MENTHOL 4-4-5 % EX PTCH: 1.0000 | MEDICATED_PATCH | Freq: Two times a day (BID) | CUTANEOUS | 0 refills | Status: AC

## 2022-11-22 NOTE — ED Triage Notes (Signed)
Pt now clarifies that she did not fall. She was laying on the floor and was trying to get up from floor by pushing herself up with her arms and felt a crack with immediate pain to R side.

## 2022-11-22 NOTE — Discharge Instructions (Addendum)
As discussed, your evaluation today has been largely reassuring.  But, it is important that you monitor your condition carefully, and do not hesitate to return to the ED if you develop new, or concerning changes in your condition.  Please use the prescribed medicated patches for relief.  If you develop skin changes, though this would be very unusual, and may be a sign of shingles, and you need to return here or see your physician.

## 2022-11-22 NOTE — ED Triage Notes (Signed)
Pt BIB [facility] from home C/O fall Saturday from standing. Pt reports R side pain since fall, concerned about possible broken rib. Denies CP, SOB, back pain.

## 2022-11-22 NOTE — ED Provider Notes (Signed)
[facility] Provider Note   CSN: [id number] Arrival date & time: 11/22/22  1027     History  Chief Complaint  Patient presents with   Rib Injury    [name] is a 77 y.o. female.  HPI Patient presents with pain in her right lower anterior axilla.  4 days ago, while on the ground, she rolled over, fell, half moved awkwardly, felt sudden onset of severe pain in that area.  Since that time pain has been persistent, in spite of OTC medication.  No additional traumatic complaints.  She states that she is generally well prior to the event.    Home Medications Prior to Admission medications   Medication Sig Start Date End Date Taking? Authorizing Provider  Methyl Salicylate-Lido-Menthol 4-4-5 % PTCH Apply 1 patch topically in the morning and at bedtime. 11/22/22  Yes Carmin Muskrat, MD  acyclovir (ZOVIRAX) 400 MG tablet Take 400 mg by mouth 2 (two) times daily.     [provider]  amLODipine (NORVASC) 10 MG tablet Take 1 tablet (10 mg total) by mouth daily. 03/11/20   Lorretta Harp, MD  aspirin EC 81 MG tablet Take 81 mg by mouth daily.    [provider]  atorvastatin (LIPITOR) 40 MG tablet Take 40 mg by mouth daily. 08/15/19   [provider]  B Complex-C (B-COMPLEX WITH VITAMIN C) tablet Take 1 tablet by mouth daily.    [provider]  Biotin 5 MG CAPS Take 1 capsule by mouth daily.    [provider]  celecoxib (CELEBREX) 200 MG capsule Take 1 capsule by mouth daily.    [provider]  DULoxetine (CYMBALTA) 30 MG capsule Take 1 capsule by mouth daily.    [provider]  EPINEPHrine 0.3 mg/0.3 mL IJ SOAJ injection Use as needed    [provider]  fluticasone (FLONASE) 50 MCG/ACT nasal spray Place 2 sprays into both nostrils as needed for allergies.     [provider]  furosemide (LASIX) 20 MG tablet Take 20-40 mg by mouth 2 (two) times daily. Take  40 mg in the morning and 20 mg in the evening    [provider]  hydrochlorothiazide (MICROZIDE) 12.5 MG capsule TAKE 1 CAPSULE BY MOUTH  DAILY Patient not taking: Reported on 05/07/2021 11/20/19   Lorretta Harp, MD  HYDROcodone-acetaminophen (NORCO) 10-325 MG tablet Take 1 tablet by mouth every 4 (four) hours. Patient not taking: Reported on 05/07/2021    [provider]  lisinopril (PRINIVIL,ZESTRIL) 40 MG tablet Take 40 mg by mouth daily.    [provider]  Magnesium 250 MG TABS Take 1 tablet by mouth daily.    [provider]  methocarbamol (ROBAXIN) 500 MG tablet Take 1 tablet by mouth every 8 (eight) hours as needed.    [provider]  Multiple Vitamins-Minerals (PRESERVISION AREDS 2 PO) Take 2 tablets by mouth daily.     [provider]  MYRBETRIQ 50 MG TB24 tablet Take 50 mg by mouth daily. 09/13/19   [provider]  oxyCODONE (ROXICODONE) 15 MG immediate release tablet     [provider]  Potassium Citrate 99 MG CAPS Take 1 capsule by mouth daily.    [provider]  pyridostigmine (MESTINON) 60 MG tablet Take 60 mg by mouth 3 (three) times daily.    [provider]  pyridOXINE (VITAMIN B-6) 100 MG tablet Take 100 mg by mouth daily.  [provider]  traMADol (ULTRAM) 50 MG tablet TAKE 1 2 TABLETS BY MOUTH 4 TIMES A DAY AS NEEDED PAIN, MAY ADD TO HYDROCODONE IF NEEDED 11/05/19   [provider]  traZODone (DESYREL) 150 MG tablet Take 150 mg by mouth at bedtime. 10/30/15   [provider]  vitamin B-12 (CYANOCOBALAMIN) 1000 MCG tablet Take 2,500 mcg by mouth daily.    [provider]  Vitamins-Lipotropics (EAR HEALTH PLUS PO) Take 1 tablet by mouth daily.    [provider]      Allergies    Bee venom    Review of Systems   Review of Systems  All other systems reviewed and are negative.   Physical Exam Updated Vital Signs BP 125/72   Pulse  74   Temp 97.8 F (36.6 C) (Oral)   Resp 18   Ht '5\' 8"'$  (4.982 m)   Wt 78.9 kg   SpO2 91%   BMI 26.46 kg/m  Physical Exam Vitals and nursing note reviewed.  Constitutional:      General: She is not in acute distress.    Appearance: She is well-developed.  HENT:     Head: Normocephalic and atraumatic.  Eyes:     Conjunctiva/sclera: Conjunctivae normal.  Pulmonary:     Effort: Pulmonary effort is normal. No respiratory distress.     Breath sounds: No stridor.  Chest:    Abdominal:     General: There is no distension.  Skin:    General: Skin is warm and dry.  Neurological:     Mental Status: She is alert and oriented to person, place, and time.     Cranial Nerves: No cranial nerve deficit.  Psychiatric:        Mood and Affect: Mood normal.     ED Results / Procedures / Treatments   Labs (all labs ordered are listed, but only abnormal results are displayed) Labs Reviewed - No data to display  EKG EKG Interpretation  Date/Time:  Tuesday November 22 2022 10:36:52 EST Ventricular Rate:  85 PR Interval:  144 QRS Duration: 109 QT Interval:  486 QTC Calculation: 578 R Axis:   62 Text Interpretation: Sinus rhythm Abnormal R-wave progression, early transition Baseline wander T wave abnormality Artifact Abnormal ECG Confirmed by Carmin Muskrat [phone]) on 11/22/2022 11:05:46 AM  Radiology CT Chest Wo Contrast  Result Date: 11/22/2022 CLINICAL DATA:  Chest trauma EXAM: CT CHEST WITHOUT CONTRAST TECHNIQUE: Multidetector CT imaging of the chest was performed following the standard protocol without IV contrast. RADIATION DOSE REDUCTION: This exam was performed according to the departmental dose-optimization program which includes automated exposure control, adjustment of the mA and/or kV according to patient size and/or use of iterative reconstruction technique. COMPARISON:  02/19/2020 FINDINGS: Cardiovascular: Atheromatous changes noted for the aorta and its branches. Ascending  thoracic aorta is ectatic measuring 4 cm. No pericardial effusion or cardiomegaly. Mediastinum/Nodes: No enlarged mediastinal or axillary lymph nodes. Thyroid gland, trachea, and esophagus demonstrate no significant findings. Lungs/Pleura: Linear bibasilar subsegmental atelectasis or scarring. No pneumonia or pulmonary edema. No pleural effusion or pneumothorax. Upper Abdomen: Right adrenal hypodense lesion consistent with an adenoma measuring 2.6 cm. Postop changes left kidney. Cholecystectomy clips. Left kidney upper pole cyst measures 2.1 cm and demonstrates calcification of its wall. This is a stable finding compared to 2021. Musculoskeletal: No fracture is seen. IMPRESSION: 1. No acute cardiopulmonary process or traumatic osseous abnormalities identified. 2. Right adrenal adenoma. 3. Left kidney upper pole calcified cyst, 2.1 cm  which is stable finding. 4. Ectatic ascending thoracic aorta, 4 cm. Electronically Signed   By: Sammie Bench M.D.   On: 11/22/2022 12:47   DG Ribs Unilateral W/Chest Right  Result Date: 11/22/2022 CLINICAL DATA:  Fall, right rib pain EXAM: RIGHT RIBS AND CHEST - 3+ VIEW COMPARISON:  05/13/2020 FINDINGS: No fracture or other bone lesions are seen involving the ribs. There is no evidence of pneumothorax or pleural effusion. Both lungs are clear. Heart size and mediastinal contours are within normal limits. IMPRESSION: Negative. Electronically Signed   By: Davina Poke D.O.   On: 11/22/2022 11:43    Procedures Procedures    Medications Ordered in ED Medications  diclofenac (FLECTOR) 1.3 % 1 patch (1 patch Transdermal Patch Applied 11/22/22 1141)    ED Course/ Medical Decision Making/ A&P                             Medical Decision Making Patient with history of diabetes, hypertension presents after fall, or awkward movement with subsequent severe pain in the right anterior chest wall.  Patient is no fever, no increased work of breathing, low suspicion for  concurrent new phenomenon.  After initial x-rays performed, with unremarkable results, CT scan was performed as well, also similarly reassuring.  Patient improvement here with diclofenac patch applied, and with results as above was discharged in stable condition.  Amount and/or Complexity of Data Reviewed Radiology: ordered and independent interpretation performed. Decision-making details documented in ED Course. ECG/medicine tests: ordered and independent interpretation performed. Decision-making details documented in ED Course.  Risk OTC drugs. Prescription drug management.   1:51 PM Patient in no distress, awake, alert, aware of all findings        Final Clinical Impression(s) / ED Diagnoses Final diagnoses:  Chest wall pain    Rx / DC Orders ED Discharge Orders          Ordered    Methyl Salicylate-Lido-Menthol 4-4-5 % PTCH  2 times daily        11/22/22 1351              Carmin Muskrat, MD 11/22/22 1351

## 2022-11-23 ENCOUNTER — Encounter: Payer: Self-pay | Admitting: *Deleted

## 2022-11-23 ENCOUNTER — Ambulatory Visit: Payer: Self-pay | Admitting: *Deleted

## 2022-11-23 NOTE — Patient Outreach (Signed)
  Care Coordination   Follow Up Visit Note   11/23/2022 Name: [name] MRN: [id number] DOB: 1946-01-02  [name] is a 77 y.o. year old female who sees [facility] [employer], MD for primary care. I spoke with  [name] by phone today.  What matters to the patients health and wellness today?  Managing rib pain    Goals Addressed             This Visit's Progress    Fall Prevention       Care Coordination Interventions: Provided written and verbal education re: potential causes of falls and Fall prevention strategies Advised patient of importance of notifying provider of falls Assessed for falls since last encounter Advised patient to discuss new or worsening symptoms associated with rib pain with provider Reviewed and discussed ED visit yesterday due to fall on 11/18/22. Has pain in right lower anterior axilla. Discussed pain management. Using Salonpas and applying heat. Husband also purchased lidocaine 4% gel per pharmacist recommendation. She hasn't tried that yet. Advised to use separately from patches.  Can alternate heat and ice to see if that will help with swelling/discomfort Advised it may take weeks for pain to resolve Discussed deep breathing exercises and importance of walking for 5 minutes or more every couple of hours to decrease risk of pneumonia associated with shallow breathing due to pain Discussed assistive devices. She has a lift chair and that helps a lot Suggested voltaren gel OTC if lidocaine gel and salonpas aren't helping Provided with [facility] telephone number and encouraged to reach out as needed Reviewed and reconciled medications from ED visit and pharmacy. Marked medications for removal that patient is no longer taking. Added medications that she is currently taking and were not listed.         SDOH assessments and interventions completed:  No     Care Coordination Interventions:  Yes, provided   Follow up plan: Follow up call scheduled for  12/09/22    Encounter Outcome:  Pt. Visit Completed   Chong Sicilian, BSN, RN-BC RN Care Coordinator [place]: [phone] Main #: [phone]

## 2022-12-09 ENCOUNTER — Encounter: Payer: Self-pay | Admitting: *Deleted

## 2022-12-09 ENCOUNTER — Ambulatory Visit: Payer: Self-pay | Admitting: *Deleted

## 2022-12-09 DIAGNOSIS — E1140 Type 2 diabetes mellitus with diabetic neuropathy, unspecified: Secondary | ICD-10-CM | POA: Diagnosis not present

## 2022-12-09 DIAGNOSIS — I10 Essential (primary) hypertension: Secondary | ICD-10-CM | POA: Diagnosis not present

## 2022-12-09 DIAGNOSIS — G894 Chronic pain syndrome: Secondary | ICD-10-CM | POA: Diagnosis not present

## 2022-12-09 DIAGNOSIS — E1165 Type 2 diabetes mellitus with hyperglycemia: Secondary | ICD-10-CM | POA: Diagnosis not present

## 2022-12-09 DIAGNOSIS — M1991 Primary osteoarthritis, unspecified site: Secondary | ICD-10-CM | POA: Diagnosis not present

## 2022-12-09 NOTE — Patient Outreach (Signed)
  Care Coordination   Follow Up Visit Note   12/09/2022 Name: DHALIA ZINGARO MRN: [id number] DOB: 1946-04-27  CAROLA VIRAMONTES is a 77 y.o. year old female who sees [facility] [employer], MD for primary care. I spoke with  [name] by phone today.  What matters to the patients health and wellness today?  Managing back pain    Goals Addressed             This Visit's Progress    Pain Management       Care Coordination Interventions: Discussed importance of adherence to all scheduled medical appointments Counseled on the importance of reporting any/all new or changed pain symptoms or management strategies to pain management provider Advised patient to report to care team affect of pain on daily activities Advised patient to discuss physical therapy or other pain improvement options with provider Assessed social determinant of health barriers Previously discussed that Dr Nelva Bush recommended back surgery but it was denied by insurance.  Discussed routine f/u with Dr Gerarda Fraction earlier today Provided with [facility] telephone number and encouraged to reach out as needed Assessed for additional care coordination or resource needs. None at this time.  Encouraged to reach out to [facility] as needed        SDOH assessments and interventions completed:  No     Care Coordination Interventions:  Yes, provided   Follow up plan: Follow up call scheduled for 01/05/23    Encounter Outcome:  Pt. Visit Completed   Chong Sicilian, BSN, RN-BC RN Care Coordinator [place]: [phone] Main #: [phone]

## 2023-01-05 ENCOUNTER — Ambulatory Visit: Payer: Self-pay | Admitting: *Deleted

## 2023-01-05 NOTE — Patient Outreach (Signed)
  Care Coordination   01/05/2023 Name: [name] MRN: [id number] DOB: [birth year]   Care Coordination Outreach Attempts:  An unsuccessful telephone outreach was attempted for a scheduled appointment today.  Follow Up Plan:  Additional outreach attempts will be made to offer the patient care coordination information and services.   Encounter Outcome:  No Answer. Left HIPAA compliant VM   Care Coordination Interventions:  Yes, provided    Chong Sicilian, BSN, RN-BC RN Care Coordinator [place]: [phone] Main #: [phone]

## 2023-01-09 DIAGNOSIS — E1140 Type 2 diabetes mellitus with diabetic neuropathy, unspecified: Secondary | ICD-10-CM | POA: Diagnosis not present

## 2023-01-09 DIAGNOSIS — I700 Atherosclerosis of aorta: Secondary | ICD-10-CM | POA: Diagnosis not present

## 2023-01-09 DIAGNOSIS — I10 Essential (primary) hypertension: Secondary | ICD-10-CM | POA: Diagnosis not present

## 2023-01-09 DIAGNOSIS — E1165 Type 2 diabetes mellitus with hyperglycemia: Secondary | ICD-10-CM | POA: Diagnosis not present

## 2023-01-09 DIAGNOSIS — G894 Chronic pain syndrome: Secondary | ICD-10-CM | POA: Diagnosis not present

## 2023-01-09 DIAGNOSIS — Z6824 Body mass index (BMI) 24.0-24.9, adult: Secondary | ICD-10-CM | POA: Diagnosis not present

## 2023-01-09 DIAGNOSIS — M1991 Primary osteoarthritis, unspecified site: Secondary | ICD-10-CM | POA: Diagnosis not present

## 2023-01-27 ENCOUNTER — Encounter: Payer: Self-pay | Admitting: *Deleted

## 2023-01-27 ENCOUNTER — Encounter: Payer: Medicare Other | Admitting: *Deleted

## 2023-01-27 ENCOUNTER — Ambulatory Visit: Payer: Self-pay | Admitting: *Deleted

## 2023-01-27 NOTE — Patient Outreach (Signed)
Care Coordination   Follow Up Visit Note   01/27/2023 Name: [name] MRN: [id number] DOB: 28-Apr-1946  [name] is a 77 y.o. year old female who sees [employer], MD for primary care. I spoke with  [name] by phone today.  What matters to the patients health and wellness today?  Preventing falls and managing blood sugar    Goals Addressed             This Visit's Progress    Fall Prevention   On track    Care Coordination Goals: Patient will use assistive devices as needed for balance Patient will move carefully and change positions slowly to minimize risk for falling Patient will notify provider of any falls and will seek emergency medical attention if needed Patient will use medical alert system Patient will reach out to [place] at [phone] with any care coordination or resource needs     Manage Blood Sugar       Care Coordination Goals: Patient will follow-up with PCP and/or endocrinologist every 3 months or as recommended Patient will take medication as prescribed and reach out to provider with any negative side effects Patient will continue to monitor and record blood sugar once per day and as needed with glucometer, and will call PCP or endocrinologist with any readings outside of recommended range Patient will take blood sugar log and meter to provider visits for review Patient will follow a modified carbohydrate diet and decrease simple carbohydrates and sugars Patient will increase activity level as tolerated with an ultimate goal of at least 150 minutes of exercise per week Patient will check feet daily for sores, wounds, calluses, etc and will notify provider of any abnormal findings Patient will have yearly eye exams to check for or monitor diabetic retinopathy Patient will reach out to [place] Coordinator [phone] with any care coordination or resource needs      COMPLETED: Pain Management   On track    Care Coordination  Interventions: Discussed importance of adherence to all scheduled medical appointments Counseled on the importance of reporting any/all new or changed pain symptoms or management strategies to pain management provider Advised patient to report to care team affect of pain on daily activities Advised patient to discuss physical therapy or other pain improvement options with provider Assessed social determinant of health barriers Previously discussed that Dr Nelva Bush recommended back surgery but it was denied by insurance.  Provided with [facility] telephone number and encouraged to reach out as needed Assessed for additional care coordination or resource needs. None at this time.  Encouraged to reach out to [facility] as needed        SDOH assessments and interventions completed:  Yes  SDOH Interventions Today    Flowsheet Row Most Recent Value  SDOH Interventions   Transportation Interventions Intervention Not Indicated  Financial Strain Interventions Intervention Not Indicated        Care Coordination Interventions:  Yes, provided  Interventions Today    Flowsheet Row Most Recent Value  Chronic Disease   Chronic disease during today's visit Diabetes, Other  [Fall Risk]  General Interventions   General Interventions Discussed/Reviewed General Interventions Discussed, General Interventions Reviewed, Labs, Annual Foot Exam, Annual Eye Exam, Durable Medical Equipment (DME), Doctor Visits  Labs Hgb A1c every 6 months  Doctor Visits Discussed/Reviewed Doctor Visits Discussed, PCP, Doctor Visits Reviewed  Durable Medical Equipment (DME) Glucomoter  Exercise Interventions   Exercise Discussed/Reviewed Exercise Reviewed  Education Interventions  Education Provided Provided Education  Provided Verbal Education On Blood Sugar Monitoring, When to see the doctor, Medication  Nutrition Interventions   Nutrition Discussed/Reviewed Carbohydrate meal planning, Decreasing sugar intake, Portion sizes   Pharmacy Interventions   Pharmacy Dicussed/Reviewed Medications and their functions  Safety Interventions   Safety Discussed/Reviewed Safety Discussed, Fall Risk       Follow up plan: Follow up call scheduled for 02/27/23    Encounter Outcome:  Pt. Visit Completed   Chong Sicilian, BSN, RN-BC RN Care Coordinator [place] Direct Dial: [phone] Main #: [phone]

## 2023-02-08 DIAGNOSIS — E7489 Other specified disorders of carbohydrate metabolism: Secondary | ICD-10-CM | POA: Diagnosis not present

## 2023-02-08 DIAGNOSIS — M1991 Primary osteoarthritis, unspecified site: Secondary | ICD-10-CM | POA: Diagnosis not present

## 2023-02-08 DIAGNOSIS — Z6825 Body mass index (BMI) 25.0-25.9, adult: Secondary | ICD-10-CM | POA: Diagnosis not present

## 2023-02-08 DIAGNOSIS — G894 Chronic pain syndrome: Secondary | ICD-10-CM | POA: Diagnosis not present

## 2023-02-08 DIAGNOSIS — F330 Major depressive disorder, recurrent, mild: Secondary | ICD-10-CM | POA: Diagnosis not present

## 2023-02-08 DIAGNOSIS — E1140 Type 2 diabetes mellitus with diabetic neuropathy, unspecified: Secondary | ICD-10-CM | POA: Diagnosis not present

## 2023-02-08 DIAGNOSIS — I700 Atherosclerosis of aorta: Secondary | ICD-10-CM | POA: Diagnosis not present

## 2023-02-08 DIAGNOSIS — I10 Essential (primary) hypertension: Secondary | ICD-10-CM | POA: Diagnosis not present

## 2023-02-08 DIAGNOSIS — E1165 Type 2 diabetes mellitus with hyperglycemia: Secondary | ICD-10-CM | POA: Diagnosis not present

## 2023-02-27 ENCOUNTER — Ambulatory Visit: Payer: Self-pay | Admitting: *Deleted

## 2023-02-27 ENCOUNTER — Encounter: Payer: Self-pay | Admitting: *Deleted

## 2023-02-27 NOTE — Patient Outreach (Signed)
  Care Coordination   Follow Up Visit Note   02/27/2023 Name: [name] MRN: [id number] DOB: [birth year]  [name] is a 77 y.o. year old female who sees Elfredia Nevins, MD for primary care. I  spoke with patient's husband, [name], by telephone. Ms Nam was outside doing yard work at the time of my call.  What matters to the patients health and wellness today?  Managing blood sugar    Goals Addressed             This Visit's Progress    Manage Blood Sugar   On track    Care Coordination Goals: Patient will follow-up with PCP and/or endocrinologist every 3 months or as recommended Patient will take medication as prescribed and reach out to provider with any negative side effects Patient will continue to monitor and record blood sugar once per day and as needed with glucometer, and will call PCP or endocrinologist with any readings outside of recommended range Patient will take blood sugar log and meter to provider visits for review Patient will follow a modified carbohydrate diet and decrease simple carbohydrates and sugars Patient will increase activity level as tolerated with an ultimate goal of at least 150 minutes of exercise per week Patient will check feet daily for sores, wounds, calluses, etc and will notify provider of any abnormal findings Patient will have yearly eye exams to check for or monitor diabetic retinopathy Patient will reach out to RN Care Coordinator [phone] with any care coordination or resource needs         SDOH assessments and interventions completed:  Yes  SDOH Interventions Today    Flowsheet Row Most Recent Value  SDOH Interventions   Transportation Interventions Intervention Not Indicated  Financial Strain Interventions Intervention Not Indicated        Care Coordination Interventions:  Yes, provided  Interventions Today    Flowsheet Row Most Recent Value  Chronic Disease   Chronic disease during today's visit Diabetes   General Interventions   General Interventions Discussed/Reviewed General Interventions Discussed, General Interventions Reviewed  Exercise Interventions   Exercise Discussed/Reviewed Physical Activity  [Patient was outdoors working in the yard at the time of my call.]  Physical Activity Discussed/Reviewed Physical Activity Reviewed  Education Interventions   Education Provided Provided Education  Provided Verbal Education On When to see the doctor, Blood Sugar Monitoring       Follow up plan: Follow up call scheduled for 03/29/23    Encounter Outcome:  Pt. Visit Completed   Demetrios Loll, BSN, RN-BC RN Care Coordinator [facility]  [facility] Direct Dial: [phone] Main #: [phone]

## 2023-03-10 ENCOUNTER — Other Ambulatory Visit (HOSPITAL_COMMUNITY): Payer: Self-pay | Admitting: Internal Medicine

## 2023-03-10 DIAGNOSIS — Z6825 Body mass index (BMI) 25.0-25.9, adult: Secondary | ICD-10-CM | POA: Diagnosis not present

## 2023-03-10 DIAGNOSIS — R221 Localized swelling, mass and lump, neck: Secondary | ICD-10-CM

## 2023-03-10 DIAGNOSIS — E1140 Type 2 diabetes mellitus with diabetic neuropathy, unspecified: Secondary | ICD-10-CM | POA: Diagnosis not present

## 2023-03-10 DIAGNOSIS — F411 Generalized anxiety disorder: Secondary | ICD-10-CM | POA: Diagnosis not present

## 2023-03-10 DIAGNOSIS — G894 Chronic pain syndrome: Secondary | ICD-10-CM | POA: Diagnosis not present

## 2023-03-10 DIAGNOSIS — E1165 Type 2 diabetes mellitus with hyperglycemia: Secondary | ICD-10-CM | POA: Diagnosis not present

## 2023-03-10 DIAGNOSIS — F330 Major depressive disorder, recurrent, mild: Secondary | ICD-10-CM | POA: Diagnosis not present

## 2023-03-10 DIAGNOSIS — I700 Atherosclerosis of aorta: Secondary | ICD-10-CM | POA: Diagnosis not present

## 2023-03-10 DIAGNOSIS — E7489 Other specified disorders of carbohydrate metabolism: Secondary | ICD-10-CM | POA: Diagnosis not present

## 2023-03-10 DIAGNOSIS — M1991 Primary osteoarthritis, unspecified site: Secondary | ICD-10-CM | POA: Diagnosis not present

## 2023-03-10 DIAGNOSIS — I10 Essential (primary) hypertension: Secondary | ICD-10-CM | POA: Diagnosis not present

## 2023-03-28 ENCOUNTER — Ambulatory Visit (HOSPITAL_COMMUNITY)
Admission: RE | Admit: 2023-03-28 | Discharge: 2023-03-28 | Disposition: A | Discharge status: Home / Self Care | Payer: Medicare HMO | Source: Ambulatory Visit | Attending: Internal Medicine | Admitting: Internal Medicine

## 2023-03-28 ENCOUNTER — Other Ambulatory Visit (HOSPITAL_COMMUNITY): Payer: Self-pay | Admitting: Internal Medicine

## 2023-03-28 DIAGNOSIS — R221 Localized swelling, mass and lump, neck: Secondary | ICD-10-CM | POA: Diagnosis not present

## 2023-03-28 DIAGNOSIS — E041 Nontoxic single thyroid nodule: Secondary | ICD-10-CM | POA: Diagnosis not present

## 2023-03-29 ENCOUNTER — Ambulatory Visit: Payer: Self-pay | Admitting: *Deleted

## 2023-03-29 NOTE — Patient Outreach (Signed)
  Care Coordination   03/29/2023 Name: [name] MRN: [id number] DOB: 11-09-1945   Care Coordination Outreach Attempts:  An unsuccessful telephone outreach was attempted for a scheduled appointment today.  Follow Up Plan:  Additional outreach attempts will be made to offer the patient care coordination information and services.   Encounter Outcome:  No Answer. Left HIPAA compliant VM.   Care Coordination Interventions:  No, not indicated    Demetrios Loll, BSN, RN-BC RN Care Coordinator [facility]  [facility] Direct Dial: [phone] Main #: [phone]

## 2023-04-11 DIAGNOSIS — F411 Generalized anxiety disorder: Secondary | ICD-10-CM | POA: Diagnosis not present

## 2023-04-11 DIAGNOSIS — E1165 Type 2 diabetes mellitus with hyperglycemia: Secondary | ICD-10-CM | POA: Diagnosis not present

## 2023-04-11 DIAGNOSIS — E1140 Type 2 diabetes mellitus with diabetic neuropathy, unspecified: Secondary | ICD-10-CM | POA: Diagnosis not present

## 2023-04-11 DIAGNOSIS — I10 Essential (primary) hypertension: Secondary | ICD-10-CM | POA: Diagnosis not present

## 2023-04-11 DIAGNOSIS — Z6826 Body mass index (BMI) 26.0-26.9, adult: Secondary | ICD-10-CM | POA: Diagnosis not present

## 2023-04-11 DIAGNOSIS — E7489 Other specified disorders of carbohydrate metabolism: Secondary | ICD-10-CM | POA: Diagnosis not present

## 2023-04-11 DIAGNOSIS — R221 Localized swelling, mass and lump, neck: Secondary | ICD-10-CM | POA: Diagnosis not present

## 2023-04-13 ENCOUNTER — Telehealth: Payer: Self-pay | Admitting: *Deleted

## 2023-04-13 NOTE — Progress Notes (Signed)
  Care Coordination Note  04/13/2023 Name: [name] MRN: [id number] DOB: September 29, 1946  [name] is a 77 y.o. year old female who is a primary care patient of Elfredia Nevins, MD and is actively engaged with the care management team. I reached out to Angelita Ingles by phone today to assist with re-scheduling a follow up visit with the RN Case Manager  Follow up plan: Unsuccessful telephone outreach attempt made. A HIPAA compliant phone message was left for the patient providing contact information and requesting a return call.  [facility]  Care Coordination Care Guide  Direct Dial: [phone]

## 2023-04-24 ENCOUNTER — Encounter: Payer: Self-pay | Admitting: *Deleted

## 2023-04-24 ENCOUNTER — Telehealth: Payer: Self-pay | Admitting: *Deleted

## 2023-04-25 NOTE — Progress Notes (Signed)
  Care Coordination Note  04/25/2023 Name: AUBRIAUNA RINER MRN: [id number] DOB: 08-Dec-1945  MARRISSA DAI is a 77 y.o. year old female who is a primary care patient of Elfredia Nevins, MD and is actively engaged with the care management team. I reached out to Angelita Ingles by phone today to assist with re-scheduling a follow up visit with the RN Case Manager  Follow up plan: Telephone appointment with care management team member scheduled for:7/30  [facility] Coordination Care Guide  Direct Dial: [phone]

## 2023-04-25 NOTE — Patient Outreach (Signed)
Care Coordination   Follow Up Visit Note   04/24/2023 Name: [name] MRN: [id number] DOB: 1946/01/24  [name] is a 77 y.o. year old female who sees Elfredia Nevins, MD for primary care. I spoke with  Angelita Ingles by phone today.  What matters to the patients health and wellness today?  Managing blood sugar and chronic back pain    Goals Addressed             This Visit's Progress    Fall Prevention   On track    Care Coordination Goals: Patient will use assistive devices as needed for balance Patient will move carefully and change positions slowly to minimize risk for falling Patient will notify provider of any falls and will seek emergency medical attention if needed Patient will keep all medical appointments and follow-up with provider regarding chronic back pain Patient to call and schedule an appointment Patient will use medical alert system Patient will reach out to RN Care Manager at [phone] with any care coordination or resource needs     Manage Blood Sugar   On track    Care Coordination Goals: Patient will follow-up with PCP and/or endocrinologist every 3 months or as recommended Patient will take medication as prescribed and reach out to provider with any negative side effects Patient will continue to monitor and record blood sugar once per day and as needed with glucometer, and will call PCP or endocrinologist with any readings outside of recommended range Patient will take blood sugar log and meter to provider visits for review Patient will follow a modified carbohydrate diet and decrease simple carbohydrates and sugars Patient will increase activity level as tolerated with an ultimate goal of at least 150 minutes of exercise per week Patient will check feet daily for sores, wounds, calluses, etc and will notify provider of any abnormal findings Patient will have yearly eye exams to check for or monitor diabetic retinopathy Patient will reach out  to RN Care Coordinator [phone] with any care coordination or resource needs         SDOH assessments and interventions completed:  Yes  SDOH Interventions Today    Flowsheet Row Most Recent Value  SDOH Interventions   Transportation Interventions Intervention Not Indicated  Physical Activity Interventions Patient Declined  [limited by chronic back pain]        Care Coordination Interventions:  Yes, provided  Interventions Today    Flowsheet Row Most Recent Value  Chronic Disease   Chronic disease during today's visit Diabetes, Other  [chronic back pain]  General Interventions   General Interventions Discussed/Reviewed General Interventions Discussed, General Interventions Reviewed, Labs, Doctor Visits, Durable Medical Equipment (DME), Annual Eye Exam, Annual Foot Exam  Labs Hgb A1c every 3 months  Doctor Visits Discussed/Reviewed Doctor Visits Discussed, Doctor Visits Reviewed, PCP, Specialist  Durable Medical Equipment (DME) Glucomoter  PCP/Specialist Visits Compliance with follow-up visit  [schedule appointment with provider re: chronic back pain]  Exercise Interventions   Exercise Discussed/Reviewed Physical Activity  Physical Activity Discussed/Reviewed Physical Activity Discussed, Physical Activity Reviewed  [decreased by chronic back pain]  Education Interventions   Education Provided Provided Education  Provided Verbal Education On Mental Health/Coping with Illness, When to see the doctor, Other, Medication, Blood Sugar Monitoring, Exercise, Labs, Nutrition  [caregiver strain]  Nutrition Interventions   Nutrition Discussed/Reviewed Nutrition Discussed, Nutrition Reviewed, Carbohydrate meal planning, Portion sizes, Decreasing sugar intake, Fluid intake  Pharmacy Interventions   Pharmacy Dicussed/Reviewed Pharmacy Topics Discussed, Pharmacy  Topics Reviewed, Medications and their functions  Safety Interventions   Safety Discussed/Reviewed Safety Discussed, Safety  Reviewed, Fall Risk, Home Safety       Follow up plan: Follow up call scheduled for 05/30/23    Encounter Outcome:  Pt. Visit Completed   Demetrios Loll, BSN, RN-BC RN Care Coordinator [facility]  [facility] Direct Dial: [phone] Main #: [phone]

## 2023-05-10 DIAGNOSIS — E1140 Type 2 diabetes mellitus with diabetic neuropathy, unspecified: Secondary | ICD-10-CM | POA: Diagnosis not present

## 2023-05-10 DIAGNOSIS — E1165 Type 2 diabetes mellitus with hyperglycemia: Secondary | ICD-10-CM | POA: Diagnosis not present

## 2023-05-10 DIAGNOSIS — F330 Major depressive disorder, recurrent, mild: Secondary | ICD-10-CM | POA: Diagnosis not present

## 2023-05-10 DIAGNOSIS — I10 Essential (primary) hypertension: Secondary | ICD-10-CM | POA: Diagnosis not present

## 2023-05-10 DIAGNOSIS — M1991 Primary osteoarthritis, unspecified site: Secondary | ICD-10-CM | POA: Diagnosis not present

## 2023-05-10 DIAGNOSIS — G894 Chronic pain syndrome: Secondary | ICD-10-CM | POA: Diagnosis not present

## 2023-05-10 DIAGNOSIS — E7489 Other specified disorders of carbohydrate metabolism: Secondary | ICD-10-CM | POA: Diagnosis not present

## 2023-05-10 DIAGNOSIS — F411 Generalized anxiety disorder: Secondary | ICD-10-CM | POA: Diagnosis not present

## 2023-05-10 DIAGNOSIS — E663 Overweight: Secondary | ICD-10-CM | POA: Diagnosis not present

## 2023-05-10 DIAGNOSIS — Z6826 Body mass index (BMI) 26.0-26.9, adult: Secondary | ICD-10-CM | POA: Diagnosis not present

## 2023-05-22 DIAGNOSIS — N390 Urinary tract infection, site not specified: Secondary | ICD-10-CM | POA: Diagnosis not present

## 2023-05-22 DIAGNOSIS — I10 Essential (primary) hypertension: Secondary | ICD-10-CM | POA: Diagnosis not present

## 2023-05-22 DIAGNOSIS — Z6826 Body mass index (BMI) 26.0-26.9, adult: Secondary | ICD-10-CM | POA: Diagnosis not present

## 2023-05-22 DIAGNOSIS — N393 Stress incontinence (female) (male): Secondary | ICD-10-CM | POA: Diagnosis not present

## 2023-05-30 ENCOUNTER — Ambulatory Visit: Payer: Self-pay | Admitting: *Deleted

## 2023-05-30 NOTE — Patient Outreach (Signed)
  Care Coordination   05/30/2023 Name: [name] MRN: [id number] DOB: 1946/05/31   Care Coordination Outreach Attempts:  An unsuccessful telephone outreach was attempted for a scheduled appointment today.  Follow Up Plan:  Additional outreach attempts will be made to offer the patient care coordination information and services.   Encounter Outcome:  No Answer. Left HIPAA compliant VM.   Care Coordination Interventions:  No, not indicated    Demetrios Loll, BSN, RN-BC RN Care Coordinator [facility]  [facility] Direct Dial: [phone] Main #: [phone]

## 2023-06-26 DIAGNOSIS — I10 Essential (primary) hypertension: Secondary | ICD-10-CM | POA: Diagnosis not present

## 2023-07-09 ENCOUNTER — Encounter: Payer: Self-pay | Admitting: Emergency Medicine

## 2023-07-09 ENCOUNTER — Other Ambulatory Visit: Payer: Self-pay

## 2023-07-09 ENCOUNTER — Ambulatory Visit
Admission: EM | Admit: 2023-07-09 | Discharge: 2023-07-09 | Disposition: A | Discharge status: Home / Self Care | Payer: Medicare HMO

## 2023-07-09 DIAGNOSIS — S0990XA Unspecified injury of head, initial encounter: Secondary | ICD-10-CM | POA: Diagnosis not present

## 2023-07-09 DIAGNOSIS — H5711 Ocular pain, right eye: Secondary | ICD-10-CM | POA: Diagnosis not present

## 2023-07-09 NOTE — Discharge Instructions (Signed)
I am worried that you injured the bones around your eye.  Unfortunately, the only way to evaluate this is with a CT scan that we do not have in urgent care.  I do recommend that you follow-up with your primary care first thing this week.  If you have any changing or worsening symptoms including increasing pain, severe headache, worst headache of your life, vision change, difficulty or pain with moving your eyes, nausea/vomiting, confusion you need to go to the emergency room immediately.

## 2023-07-09 NOTE — ED Provider Notes (Addendum)
[facility]    CSN: [id number] Arrival date & time: 07/09/23  1413      History   Chief Complaint Chief Complaint  Patient presents with   Headache    HPI [name] is a 77 y.o. female.   Patient presents today accompanied by her husband who help provide majority of history.  Reports that 5 days ago (07/04/2023) she was at the bank when she hit her head on the counter.  She did not have any loss of consciousness.  She was evaluated by EMS and fire department but declined ER transport and has not been evaluated since the initial injury.  She does report ongoing headache pain in a particular area where she hit her head.  This is not the worst headache of her life and she denies any associated nausea, vomiting, amnesia surrounding event, visual disturbance.  She takes baby aspirin but no other blood thinning medication.  The headache/pain has not worsened since the injury.  She denies previous concussion.  She is able to perform her daily activities without any difficulty.  She denies any difficulty with extraocular movements, diplopia.  She reports that the pain is localized to medial superior right orbit and is tender to touch in a very specific area.  With touch pain is rated 9 on a 0-10 pain scale, described as soreness, no alleviating factors identified.  She is prescribed oxycodone for chronic pain and has been taking this as needed with minimal improvement of symptoms.  She has also tried over-the-counter Tylenol.    Past Medical History:  Diagnosis Date   Anginal pain (HCC)    Atypical chest pain    Depression    Hyperlipidemia    Hypertension    Kidney tumor    Obesity     Patient Active Problem List   Diagnosis Date Noted   Hyperlipidemia 03/02/2016   Atypical chest pain 03/02/2016   Ileitis    Abdominal pain 11/11/2015   Hypertension 11/11/2015   Diabetes (HCC) 11/11/2015   Diabetic neuropathy associated with type 2 diabetes mellitus (HCC) 05/29/2014    Generalized anxiety disorder 08/31/2011    Past Surgical History:  Procedure Laterality Date   ABDOMINAL HYSTERECTOMY     CARDIAC CATHETERIZATION  2009   normal coronary arteries, preserved LVEF 60%   CHOLECYSTECTOMY  2006   Dr. Lindie Spruce.   COLONOSCOPY  2004   Dr. Loreta Ave: hemorrhoids, diverticulosis, some stool present and small lesions could have been missed. repeat colonoscopy in five years.    COLONOSCOPY  2016   Dr. Loreta Ave: unremarkable per patient   EMBOLIZATION     HERNIA REPAIR     IR RADIOLOGIST EVAL & MGMT  12/31/2020   TONSILLECTOMY      OB History   No obstetric history on file.      Home Medications    Prior to Admission medications   Medication Sig Start Date End Date Taking? Authorizing Provider  acyclovir (ZOVIRAX) 400 MG tablet Take 400 mg by mouth 2 (two) times daily.     [provider]  amLODipine (NORVASC) 10 MG tablet Take 1 tablet (10 mg total) by mouth daily. Patient not taking: Reported on 11/23/2022 03/11/20   Runell Gess, MD  aspirin EC 81 MG tablet Take 81 mg by mouth daily. Patient not taking: Reported on 11/23/2022    [provider]  atorvastatin (LIPITOR) 80 MG tablet Take 80 mg by mouth daily. 08/15/19   [provider]  B  Complex-C (B-COMPLEX WITH VITAMIN C) tablet Take 1 tablet by mouth daily.    [provider]  Biotin 5 MG CAPS Take 1 capsule by mouth daily.    [provider]  celecoxib (CELEBREX) 200 MG capsule Take 1 capsule by mouth daily. Patient not taking: Reported on 11/23/2022    [provider]  Cholecalciferol (VITAMIN D3) 1.25 MG (50000 UT) CAPS Take 10,000 Units by mouth daily at 6 (six) AM.    [provider]  docusate sodium (COLACE) 100 MG capsule Take 100 mg by mouth 2 (two) times daily.    [provider]  DULoxetine (CYMBALTA) 30 MG capsule Take 1 capsule by mouth daily. Patient not taking: Reported on 11/23/2022    [provider]  EPINEPHrine  0.3 mg/0.3 mL IJ SOAJ injection Use as needed    [provider]  fluticasone (FLONASE) 50 MCG/ACT nasal spray Place 2 sprays into both nostrils as needed for allergies.  Patient not taking: Reported on 11/23/2022    [provider]  furosemide (LASIX) 20 MG tablet Take 20 mg by mouth 2 (two) times daily.    [provider]  glimepiride (AMARYL) 4 MG tablet Take 4 mg by mouth daily.    [provider]  glucose blood (ACCU-CHEK AVIVA PLUS) test strip 1 each by Other route as needed for other. 08/27/15   [provider]  hydrochlorothiazide (MICROZIDE) 12.5 MG capsule TAKE 1 CAPSULE BY MOUTH  DAILY 11/20/19   Runell Gess, MD  HYDROcodone-acetaminophen (NORCO) 10-325 MG tablet Take 1 tablet by mouth every 4 (four) hours.    [provider]  lisinopril (PRINIVIL,ZESTRIL) 40 MG tablet Take 40 mg by mouth daily.    [provider]  Magnesium 250 MG TABS Take 1 tablet by mouth daily. Patient not taking: Reported on 11/23/2022    [provider]  methocarbamol (ROBAXIN) 500 MG tablet Take 1 tablet by mouth every 8 (eight) hours as needed.    [provider]  Methyl Salicylate-Lido-Menthol 4-4-5 % PTCH Apply 1 patch topically in the morning and at bedtime. 11/22/22   Gerhard Munch, MD  Multiple Vitamins-Minerals (PRESERVISION AREDS 2 PO) Take 2 tablets by mouth daily.     [provider]  MYRBETRIQ 50 MG TB24 tablet Take 50 mg by mouth daily. 09/13/19   [provider]  oxyCODONE (ROXICODONE) 15 MG immediate release tablet     [provider]  Potassium Citrate 99 MG CAPS Take 1 capsule by mouth daily. Patient not taking: Reported on 11/23/2022    [provider]  pyridostigmine (MESTINON) 60 MG tablet Take 60 mg by mouth 3 (three) times daily. Patient not taking: Reported on 11/23/2022    [provider]  pyridOXINE (VITAMIN B-6) 100 MG tablet Take 100 mg by mouth daily.     [provider]  traMADol (ULTRAM) 50 MG tablet TAKE 1 2 TABLETS BY MOUTH 4 TIMES A DAY AS NEEDED PAIN, MAY ADD TO HYDROCODONE IF NEEDED Patient not taking: Reported on 11/23/2022 11/05/19   [provider]  traZODone (DESYREL) 150 MG tablet Take 150 mg by mouth at bedtime. 10/30/15   [provider]  vitamin B-12 (CYANOCOBALAMIN) 1000 MCG tablet Take 2,500 mcg by mouth daily.    [provider]  Vitamins-Lipotropics (EAR HEALTH PLUS PO) Take 1 tablet by mouth daily.    [provider]    Family History Family History  Problem Relation Age of Onset   Diabetes  Mother    Hypertension Mother    Colon cancer Brother        deceased age 44   Stroke Father    Cancer Sister        breast   Breast cancer Sister    Other Son        chron's    Breast cancer Maternal Grandmother     Social History Social History   Tobacco Use   Smoking status: Never   Smokeless tobacco: Never  Vaping Use   Vaping status: Never Used  Substance Use Topics   Alcohol use: No   Drug use: No     Allergies   Bee venom   Review of Systems Review of Systems  Constitutional:  Negative for activity change, appetite change, fatigue and fever.  Eyes:  Negative for photophobia and visual disturbance.  Respiratory:  Negative for cough and shortness of breath.   Cardiovascular:  Negative for chest pain.  Gastrointestinal:  Negative for abdominal pain, diarrhea, nausea and vomiting.  Neurological:  Positive for headaches. Negative for dizziness, seizures, syncope, facial asymmetry, speech difficulty, weakness and light-headedness.     Physical Exam Triage Vital Signs ED Triage Vitals [07/09/23 1422]  Encounter Vitals Group     BP 124/69     Systolic BP Percentile      Diastolic BP Percentile      Pulse Rate 84     Resp 20     Temp 97.6 F (36.4 C)     Temp Source Oral     SpO2 98 %     Weight      Height      Head Circumference      Peak Flow       Pain Score 8     Pain Loc      Pain Education      Exclude from Growth Chart    No data found.  Updated Vital Signs BP 124/69 (BP Location: Right Arm)   Pulse 84   Temp 97.6 F (36.4 C) (Oral)   Resp 20   SpO2 98%   Visual Acuity Right Eye Distance:   Left Eye Distance:   Bilateral Distance:    Right Eye Near:   Left Eye Near:    Bilateral Near:     Physical Exam Vitals reviewed.  Constitutional:      General: She is awake. She is not in acute distress.    Appearance: Normal appearance. She is well-developed. She is not ill-appearing.     Comments: Very pleasant female appears stated age sitting comfortably in exam room in no acute distress  HENT:     Head: Normocephalic. No raccoon eyes, Battle's sign, contusion or laceration.      Right Ear: Tympanic membrane, ear canal and external ear normal. No hemotympanum.     Left Ear: Tympanic membrane, ear canal and external ear normal. No hemotympanum.     Ears:     Comments: Cerumen impaction noted bilaterally.  Able to visualize approximately 40% of TM that appears normal.    Nose: Nose normal.     Mouth/Throat:     Tongue: Tongue does not deviate from midline.     Pharynx: Uvula midline. No oropharyngeal exudate or posterior oropharyngeal erythema.  Eyes:     Extraocular Movements: Extraocular movements intact.     Conjunctiva/sclera: Conjunctivae normal.     Pupils: Pupils are equal, round, and reactive to light.     Comments: Tenderness over  medial superior right orbit without deformity.  Normal extraocular movement.  Cardiovascular:     Rate and Rhythm: Normal rate and regular rhythm.     Heart sounds: S1 normal and S2 normal. Murmur heard.  Pulmonary:     Effort: Pulmonary effort is normal.     Breath sounds: Normal breath sounds. No wheezing, rhonchi or rales.     Comments: Clear to auscultation bilaterally Musculoskeletal:     Cervical back: Normal range of motion and neck supple. No spinous process tenderness  or muscular tenderness.     Comments: Strength 5/5 bilateral upper and lower extremities.  Skin:    Findings: No bruising or ecchymosis.  Neurological:     General: No focal deficit present.     Mental Status: She is alert and oriented to person, place, and time.     Cranial Nerves: Cranial nerves 2-12 are intact.     Motor: Motor function is intact.     Coordination: Coordination is intact.     Gait: Gait is intact.     Comments: No focal neurological defect on exam.  Psychiatric:        Behavior: Behavior is cooperative.      UC Treatments / Results  Labs (all labs ordered are listed, but only abnormal results are displayed) Labs Reviewed - No data to display  EKG   Radiology No results found.  Procedures Procedures (including critical care time)  Medications Ordered in UC Medications - No data to display  Initial Impression / Assessment and Plan / UC Course  I have reviewed the triage vital signs and the nursing notes.  Pertinent labs & imaging results that were available during my care of the patient were reviewed by me and considered in my medical decision making (see chart for details).     Patient is well-appearing, afebrile, nontoxic, nontachycardic.  Vital signs and physical exam are reassuring with no indication for emergent evaluation.  We did discuss that based on her [place] CT score she is a candidate for head CT based on her age given mechanism of injury.  We also discussed that ideally we would image her orbits as I am concerned for an orbital fracture though there does not seem to be any ocular muscle entrapment based on her exam today.  Discussed that we do not have imaging capabilities in urgent care but she declined going to the emergency room.  We discussed that given it has been 5 days since her injury and her symptoms not worsening and her exam is intact it is reasonable to follow-up tomorrow with her primary care to determine if outpatient imaging is  appropriate.  She will contact her primary care and schedule an appointment soon as possible.  She is already prescribed oxycodone for pain and was encouraged to take this as scheduled by her PCP.  She can use over-the-counter Tylenol for breakthrough pain.  We discussed at length that if she has any worsening or changing symptoms including increasing headache, worsening of her life, confusion, dizziness, visual disturbance, nausea/vomiting she needs to go to the emergency room immediately to which she and her husband expressed understanding.  We discussed at length that if anything changes she needs to go to the ER since she is declining to do so today to which she agreed.  Final Clinical Impressions(s) / UC Diagnoses   Final diagnoses:  Injury of head, initial encounter  Orbital pain, right     Discharge Instructions  I am worried that you injured the bones around your eye.  Unfortunately, the only way to evaluate this is with a CT scan that we do not have in urgent care.  I do recommend that you follow-up with your primary care first thing this week.  If you have any changing or worsening symptoms including increasing pain, severe headache, worst headache of your life, vision change, difficulty or pain with moving your eyes, nausea/vomiting, confusion you need to go to the emergency room immediately.     ED Prescriptions   None    I have reviewed the PDMP during this encounter.   Jeani Hawking, PA-C 07/09/23 1529    RaspetNoberto Retort, PA-C 07/09/23 1651

## 2023-07-09 NOTE — ED Triage Notes (Addendum)
Pt reports was at bank on 9[place]3 and reports bent over and hit head on counter. Reports ems and fire department also came. Pt refused to go to ED at that time. Pt reports dizziness at time of hitting head but denies loc. Intermittent headache ever since.   Pt alert and oriented at this time. Is not on blood thinners.

## 2023-07-09 NOTE — ED Notes (Addendum)
Discussed triage with provider. Pt aware does not have CT scan at [facility]. Verbalized understanding.   PA reported pt stable to return to waiting room to be evaluated.

## 2023-07-10 DIAGNOSIS — G894 Chronic pain syndrome: Secondary | ICD-10-CM | POA: Diagnosis not present

## 2023-07-10 DIAGNOSIS — Z6827 Body mass index (BMI) 27.0-27.9, adult: Secondary | ICD-10-CM | POA: Diagnosis not present

## 2023-07-10 DIAGNOSIS — Z23 Encounter for immunization: Secondary | ICD-10-CM | POA: Diagnosis not present

## 2023-08-04 DIAGNOSIS — Z6828 Body mass index (BMI) 28.0-28.9, adult: Secondary | ICD-10-CM | POA: Diagnosis not present

## 2023-08-04 DIAGNOSIS — M1991 Primary osteoarthritis, unspecified site: Secondary | ICD-10-CM | POA: Diagnosis not present

## 2023-08-04 DIAGNOSIS — I10 Essential (primary) hypertension: Secondary | ICD-10-CM | POA: Diagnosis not present

## 2023-08-04 DIAGNOSIS — G894 Chronic pain syndrome: Secondary | ICD-10-CM | POA: Diagnosis not present

## 2023-08-04 DIAGNOSIS — E1165 Type 2 diabetes mellitus with hyperglycemia: Secondary | ICD-10-CM | POA: Diagnosis not present

## 2023-08-04 DIAGNOSIS — E6609 Other obesity due to excess calories: Secondary | ICD-10-CM | POA: Diagnosis not present

## 2023-08-04 DIAGNOSIS — E1140 Type 2 diabetes mellitus with diabetic neuropathy, unspecified: Secondary | ICD-10-CM | POA: Diagnosis not present

## 2023-10-09 DIAGNOSIS — G894 Chronic pain syndrome: Secondary | ICD-10-CM | POA: Diagnosis not present

## 2023-10-09 DIAGNOSIS — F411 Generalized anxiety disorder: Secondary | ICD-10-CM | POA: Diagnosis not present

## 2023-10-09 DIAGNOSIS — E663 Overweight: Secondary | ICD-10-CM | POA: Diagnosis not present

## 2023-10-09 DIAGNOSIS — Z6828 Body mass index (BMI) 28.0-28.9, adult: Secondary | ICD-10-CM | POA: Diagnosis not present

## 2023-10-09 DIAGNOSIS — F330 Major depressive disorder, recurrent, mild: Secondary | ICD-10-CM | POA: Diagnosis not present

## 2023-10-09 DIAGNOSIS — I10 Essential (primary) hypertension: Secondary | ICD-10-CM | POA: Diagnosis not present

## 2023-10-09 DIAGNOSIS — E1165 Type 2 diabetes mellitus with hyperglycemia: Secondary | ICD-10-CM | POA: Diagnosis not present

## 2023-10-09 DIAGNOSIS — M1991 Primary osteoarthritis, unspecified site: Secondary | ICD-10-CM | POA: Diagnosis not present

## 2023-10-09 DIAGNOSIS — E1140 Type 2 diabetes mellitus with diabetic neuropathy, unspecified: Secondary | ICD-10-CM | POA: Diagnosis not present

## 2023-10-30 ENCOUNTER — Other Ambulatory Visit: Payer: Self-pay | Admitting: Internal Medicine

## 2023-10-30 DIAGNOSIS — Z0000 Encounter for general adult medical examination without abnormal findings: Secondary | ICD-10-CM

## 2023-11-15 ENCOUNTER — Ambulatory Visit
Admission: RE | Admit: 2023-11-15 | Discharge: 2023-11-15 | Disposition: A | Discharge status: Home / Self Care | Payer: Medicare HMO | Source: Ambulatory Visit | Attending: Internal Medicine | Admitting: Internal Medicine

## 2023-11-15 DIAGNOSIS — Z1231 Encounter for screening mammogram for malignant neoplasm of breast: Secondary | ICD-10-CM | POA: Diagnosis not present

## 2023-11-15 DIAGNOSIS — Z0000 Encounter for general adult medical examination without abnormal findings: Secondary | ICD-10-CM

## 2024-01-04 DIAGNOSIS — Z6828 Body mass index (BMI) 28.0-28.9, adult: Secondary | ICD-10-CM | POA: Diagnosis not present

## 2024-01-04 DIAGNOSIS — M1991 Primary osteoarthritis, unspecified site: Secondary | ICD-10-CM | POA: Diagnosis not present

## 2024-01-04 DIAGNOSIS — E1165 Type 2 diabetes mellitus with hyperglycemia: Secondary | ICD-10-CM | POA: Diagnosis not present

## 2024-01-04 DIAGNOSIS — E1140 Type 2 diabetes mellitus with diabetic neuropathy, unspecified: Secondary | ICD-10-CM | POA: Diagnosis not present

## 2024-01-04 DIAGNOSIS — I700 Atherosclerosis of aorta: Secondary | ICD-10-CM | POA: Diagnosis not present

## 2024-01-04 DIAGNOSIS — I10 Essential (primary) hypertension: Secondary | ICD-10-CM | POA: Diagnosis not present

## 2024-01-04 DIAGNOSIS — F330 Major depressive disorder, recurrent, mild: Secondary | ICD-10-CM | POA: Diagnosis not present

## 2024-01-04 DIAGNOSIS — G894 Chronic pain syndrome: Secondary | ICD-10-CM | POA: Diagnosis not present

## 2024-02-01 DIAGNOSIS — E7849 Other hyperlipidemia: Secondary | ICD-10-CM | POA: Diagnosis not present

## 2024-02-01 DIAGNOSIS — F411 Generalized anxiety disorder: Secondary | ICD-10-CM | POA: Diagnosis not present

## 2024-02-01 DIAGNOSIS — D518 Other vitamin B12 deficiency anemias: Secondary | ICD-10-CM | POA: Diagnosis not present

## 2024-02-01 DIAGNOSIS — Z6828 Body mass index (BMI) 28.0-28.9, adult: Secondary | ICD-10-CM | POA: Diagnosis not present

## 2024-02-01 DIAGNOSIS — E1165 Type 2 diabetes mellitus with hyperglycemia: Secondary | ICD-10-CM | POA: Diagnosis not present

## 2024-02-01 DIAGNOSIS — E039 Hypothyroidism, unspecified: Secondary | ICD-10-CM | POA: Diagnosis not present

## 2024-02-01 DIAGNOSIS — M1991 Primary osteoarthritis, unspecified site: Secondary | ICD-10-CM | POA: Diagnosis not present

## 2024-02-01 DIAGNOSIS — E559 Vitamin D deficiency, unspecified: Secondary | ICD-10-CM | POA: Diagnosis not present

## 2024-02-01 DIAGNOSIS — E1140 Type 2 diabetes mellitus with diabetic neuropathy, unspecified: Secondary | ICD-10-CM | POA: Diagnosis not present

## 2024-02-01 DIAGNOSIS — E663 Overweight: Secondary | ICD-10-CM | POA: Diagnosis not present

## 2024-02-01 DIAGNOSIS — I10 Essential (primary) hypertension: Secondary | ICD-10-CM | POA: Diagnosis not present

## 2024-02-01 DIAGNOSIS — G894 Chronic pain syndrome: Secondary | ICD-10-CM | POA: Diagnosis not present

## 2024-03-01 DIAGNOSIS — E1140 Type 2 diabetes mellitus with diabetic neuropathy, unspecified: Secondary | ICD-10-CM | POA: Diagnosis not present

## 2024-03-01 DIAGNOSIS — F0390 Unspecified dementia without behavioral disturbance: Secondary | ICD-10-CM | POA: Diagnosis not present

## 2024-03-01 DIAGNOSIS — I10 Essential (primary) hypertension: Secondary | ICD-10-CM | POA: Diagnosis not present

## 2024-03-01 DIAGNOSIS — E1165 Type 2 diabetes mellitus with hyperglycemia: Secondary | ICD-10-CM | POA: Diagnosis not present

## 2024-03-01 DIAGNOSIS — E663 Overweight: Secondary | ICD-10-CM | POA: Diagnosis not present

## 2024-03-01 DIAGNOSIS — G894 Chronic pain syndrome: Secondary | ICD-10-CM | POA: Diagnosis not present

## 2024-03-01 DIAGNOSIS — R109 Unspecified abdominal pain: Secondary | ICD-10-CM | POA: Diagnosis not present

## 2024-03-01 DIAGNOSIS — Z6828 Body mass index (BMI) 28.0-28.9, adult: Secondary | ICD-10-CM | POA: Diagnosis not present

## 2024-03-01 DIAGNOSIS — F411 Generalized anxiety disorder: Secondary | ICD-10-CM | POA: Diagnosis not present

## 2024-03-01 DIAGNOSIS — M1991 Primary osteoarthritis, unspecified site: Secondary | ICD-10-CM | POA: Diagnosis not present

## 2024-03-01 DIAGNOSIS — M7581 Other shoulder lesions, right shoulder: Secondary | ICD-10-CM | POA: Diagnosis not present

## 2024-04-02 DIAGNOSIS — E1165 Type 2 diabetes mellitus with hyperglycemia: Secondary | ICD-10-CM | POA: Diagnosis not present

## 2024-04-02 DIAGNOSIS — Z6827 Body mass index (BMI) 27.0-27.9, adult: Secondary | ICD-10-CM | POA: Diagnosis not present

## 2024-04-02 DIAGNOSIS — E1140 Type 2 diabetes mellitus with diabetic neuropathy, unspecified: Secondary | ICD-10-CM | POA: Diagnosis not present

## 2024-04-02 DIAGNOSIS — G894 Chronic pain syndrome: Secondary | ICD-10-CM | POA: Diagnosis not present

## 2024-04-02 DIAGNOSIS — I10 Essential (primary) hypertension: Secondary | ICD-10-CM | POA: Diagnosis not present

## 2024-04-02 DIAGNOSIS — F411 Generalized anxiety disorder: Secondary | ICD-10-CM | POA: Diagnosis not present

## 2024-04-02 DIAGNOSIS — M1991 Primary osteoarthritis, unspecified site: Secondary | ICD-10-CM | POA: Diagnosis not present

## 2024-04-27 ENCOUNTER — Encounter (HOSPITAL_COMMUNITY): Payer: Self-pay | Admitting: Interventional Radiology

## 2024-05-10 DIAGNOSIS — F411 Generalized anxiety disorder: Secondary | ICD-10-CM | POA: Diagnosis not present

## 2024-05-10 DIAGNOSIS — E663 Overweight: Secondary | ICD-10-CM | POA: Diagnosis not present

## 2024-05-10 DIAGNOSIS — E039 Hypothyroidism, unspecified: Secondary | ICD-10-CM | POA: Diagnosis not present

## 2024-05-10 DIAGNOSIS — E1165 Type 2 diabetes mellitus with hyperglycemia: Secondary | ICD-10-CM | POA: Diagnosis not present

## 2024-05-10 DIAGNOSIS — I10 Essential (primary) hypertension: Secondary | ICD-10-CM | POA: Diagnosis not present

## 2024-05-10 DIAGNOSIS — M1991 Primary osteoarthritis, unspecified site: Secondary | ICD-10-CM | POA: Diagnosis not present

## 2024-05-10 DIAGNOSIS — G894 Chronic pain syndrome: Secondary | ICD-10-CM | POA: Diagnosis not present

## 2024-05-10 DIAGNOSIS — E1140 Type 2 diabetes mellitus with diabetic neuropathy, unspecified: Secondary | ICD-10-CM | POA: Diagnosis not present

## 2024-05-10 DIAGNOSIS — Z6827 Body mass index (BMI) 27.0-27.9, adult: Secondary | ICD-10-CM | POA: Diagnosis not present

## 2024-05-24 ENCOUNTER — Telehealth: Payer: Self-pay | Admitting: Family Medicine

## 2024-05-24 ENCOUNTER — Ambulatory Visit: Payer: Self-pay

## 2024-05-24 NOTE — Telephone Encounter (Unsigned)
 Copied from CRM [id number]. Topic: Clinical - Medication Refill >> May 24, 2024  1:10 PM Delon DASEN wrote: Original doctor who prescribed  retired  Medication: oxyCODONE  (ROXICODONE ) 15 MG immediate release table  Has the patient contacted their pharmacy? No (Agent: If no, request that the patient contact the pharmacy for the refill. If patient does not wish to contact the pharmacy document the reason why and proceed with request.) (Agent: If yes, when and what did the pharmacy advise?)  This is the patient's preferred pharmacy:   [facility] - [address], [place], [place] [address] Phone: [phone]  Is this the correct pharmacy for this prescription? Yes If no, delete pharmacy and type the correct one.   Has the prescription been filled recently? Yes  Is the patient out of the medication? No  Has the patient been seen for an appointment in the last year OR does the patient have an upcoming appointment? Yes  Can we respond through MyChart? No  Agent: Please be advised that Rx refills may take up to 3 business days. We ask that you follow-up with your pharmacy.

## 2024-05-24 NOTE — Telephone Encounter (Signed)
 Pt notified that medications can't be refilled until established at new PCP at [facility]. PT verbalized understanding.

## 2024-06-24 DIAGNOSIS — G894 Chronic pain syndrome: Secondary | ICD-10-CM | POA: Diagnosis not present

## 2024-06-24 DIAGNOSIS — M1991 Primary osteoarthritis, unspecified site: Secondary | ICD-10-CM | POA: Diagnosis not present

## 2024-06-24 DIAGNOSIS — E1165 Type 2 diabetes mellitus with hyperglycemia: Secondary | ICD-10-CM | POA: Diagnosis not present

## 2024-06-24 DIAGNOSIS — I10 Essential (primary) hypertension: Secondary | ICD-10-CM | POA: Diagnosis not present

## 2024-06-24 DIAGNOSIS — E1140 Type 2 diabetes mellitus with diabetic neuropathy, unspecified: Secondary | ICD-10-CM | POA: Diagnosis not present

## 2024-06-24 DIAGNOSIS — Z6826 Body mass index (BMI) 26.0-26.9, adult: Secondary | ICD-10-CM | POA: Diagnosis not present

## 2024-06-24 DIAGNOSIS — F411 Generalized anxiety disorder: Secondary | ICD-10-CM | POA: Diagnosis not present

## 2024-07-23 ENCOUNTER — Ambulatory Visit
Admission: EM | Admit: 2024-07-23 | Discharge: 2024-07-23 | Disposition: A | Discharge status: Home / Self Care | Attending: Nurse Practitioner | Admitting: Nurse Practitioner

## 2024-07-23 ENCOUNTER — Encounter: Payer: Self-pay | Admitting: Emergency Medicine

## 2024-07-23 DIAGNOSIS — R39198 Other difficulties with micturition: Secondary | ICD-10-CM | POA: Diagnosis not present

## 2024-07-23 DIAGNOSIS — R399 Unspecified symptoms and signs involving the genitourinary system: Secondary | ICD-10-CM | POA: Diagnosis not present

## 2024-07-23 LAB — POCT URINE DIPSTICK
Bilirubin, UA: NEGATIVE
Glucose, UA: NEGATIVE mg/dL
Nitrite, UA: NEGATIVE
POC PROTEIN,UA: 100 — AB
Spec Grav, UA: >=1.03 — AB (ref 1.010–1.025)
Urobilinogen, UA: 0.2 U/dL — AB
pH, UA: 5.5 (ref 5.0–8.0)

## 2024-07-23 MED ORDER — CEPHALEXIN 500 MG PO CAPS: 500.0000 mg | ORAL_CAPSULE | Freq: Four times a day (QID) | ORAL | 0 refills | Status: DC

## 2024-07-23 NOTE — ED Triage Notes (Signed)
 States unable to urinate today. States she feels like she need to urinate, but just can't  States took one cipro  to help with symptoms this morning.

## 2024-07-23 NOTE — ED Provider Notes (Signed)
 [facility]    CSN: [id number] Arrival date & time: 07/23/24  1309      History   Chief Complaint No chief complaint on file.   HPI [name] is a 78 y.o. female.   The history is provided by the patient.   Patient presents for complaints of urinary hesitancy that started this morning.  Patient states that she does not recall urinating last evening, and states that she had a difficult on urinating this morning.  Patient states that she has not had any other symptoms to include fever, chills, abdominal pain, dysuria, urgency, frequency, low back pain, flank pain, or vaginal symptoms.  Patient denies history of recurrent urinary tract infections.  States that she did take 1 Cipro  that she had left from a previous prescription.  Past Medical History:  Diagnosis Date   Anginal pain    Atypical chest pain    Depression    Hyperlipidemia    Hypertension    Kidney tumor    Obesity     Patient Active Problem List   Diagnosis Date Noted   Hyperlipidemia 03/02/2016   Atypical chest pain 03/02/2016   Ileitis    Abdominal pain 11/11/2015   Hypertension 11/11/2015   Diabetes (HCC) 11/11/2015   Diabetic neuropathy associated with type 2 diabetes mellitus (HCC) 05/29/2014   Generalized anxiety disorder 08/31/2011    Past Surgical History:  Procedure Laterality Date   ABDOMINAL HYSTERECTOMY     CARDIAC CATHETERIZATION  2009   normal coronary arteries, preserved LVEF 60%   CHOLECYSTECTOMY  2006   Dr. Kimble.   COLONOSCOPY  2004   Dr. Kristie: hemorrhoids, diverticulosis, some stool present and small lesions could have been missed. repeat colonoscopy in five years.    COLONOSCOPY  2016   Dr. Kristie: unremarkable per patient   EMBOLIZATION     HERNIA REPAIR     IR RADIOLOGIST EVAL & MGMT  12/31/2020   TONSILLECTOMY      OB History   No obstetric history on file.      Home Medications    Prior to Admission medications   Medication Sig Start Date End Date  Taking? Authorizing Provider  cephALEXin  (KEFLEX ) 500 MG capsule Take 1 capsule (500 mg total) by mouth 4 (four) times daily. 07/23/24  Yes Leath-Warren, Etta PARAS, NP  acyclovir  (ZOVIRAX ) 400 MG tablet Take 400 mg by mouth 2 (two) times daily.     [provider]  amLODipine  (NORVASC ) 10 MG tablet Take 1 tablet (10 mg total) by mouth daily. Patient not taking: Reported on 11/23/2022 03/11/20   Court Dorn PARAS, MD  aspirin  EC 81 MG tablet Take 81 mg by mouth daily. Patient not taking: Reported on 11/23/2022    [provider]  atorvastatin  (LIPITOR) 80 MG tablet Take 80 mg by mouth daily. 08/15/19   [provider]  B Complex-C (B-COMPLEX WITH VITAMIN C) tablet Take 1 tablet by mouth daily.    [provider]  Biotin 5 MG CAPS Take 1 capsule by mouth daily.    [provider]  celecoxib (CELEBREX) 200 MG capsule Take 1 capsule by mouth daily. Patient not taking: Reported on 11/23/2022    [provider]  Cholecalciferol (VITAMIN D3) 1.25 MG (50000 UT) CAPS Take 10,000 Units by mouth daily at 6 (six) AM.    [provider]  docusate sodium  (COLACE) 100 MG capsule Take 100 mg by mouth 2 (two) times daily.    [provider]  DULoxetine (CYMBALTA) 30 MG capsule Take 1 capsule by mouth daily. Patient not taking: Reported on 11/23/2022    [provider]  EPINEPHrine  0.3 mg/0.3 mL IJ SOAJ injection Use as needed    [provider]  fluticasone (FLONASE) 50 MCG/ACT nasal spray Place 2 sprays into both nostrils as needed for allergies.  Patient not taking: Reported on 11/23/2022    [provider]  furosemide (LASIX) 20 MG tablet Take 20 mg by mouth 2 (two) times daily.    [provider]  glimepiride (AMARYL) 4 MG tablet Take 4 mg by mouth daily.    [provider]  glucose blood (ACCU-CHEK AVIVA PLUS) test strip 1 each by Other route as needed for other. 08/27/15   [provider]   hydrochlorothiazide  (MICROZIDE ) 12.5 MG capsule TAKE 1 CAPSULE BY MOUTH  DAILY 11/20/19   Court Dorn PARAS, MD  HYDROcodone -acetaminophen  (NORCO) 10-325 MG tablet Take 1 tablet by mouth every 4 (four) hours.    [provider]  lisinopril  (PRINIVIL ,ZESTRIL ) 40 MG tablet Take 40 mg by mouth daily.    [provider]  Magnesium 250 MG TABS Take 1 tablet by mouth daily. Patient not taking: Reported on 11/23/2022    [provider]  methocarbamol (ROBAXIN) 500 MG tablet Take 1 tablet by mouth every 8 (eight) hours as needed.    [provider]  Methyl Salicylate -Lido-Menthol  4-4-5 % PTCH Apply 1 patch topically in the morning and at bedtime. 11/22/22   Garrick Charleston, MD  Multiple Vitamins-Minerals (PRESERVISION AREDS 2 PO) Take 2 tablets by mouth daily.     [provider]  MYRBETRIQ 50 MG TB24 tablet Take 50 mg by mouth daily. 09/13/19   [provider]  oxyCODONE  (ROXICODONE ) 15 MG immediate release tablet     [provider]  Potassium Citrate 99 MG CAPS Take 1 capsule by mouth daily. Patient not taking: Reported on 11/23/2022    [provider]  pyridostigmine (MESTINON) 60 MG tablet Take 60 mg by mouth 3 (three) times daily. Patient not taking: Reported on 11/23/2022    [provider]  pyridOXINE (VITAMIN B-6) 100 MG tablet Take 100 mg by mouth daily.    [provider]  traMADol (ULTRAM) 50 MG tablet TAKE 1 2 TABLETS BY MOUTH 4 TIMES A DAY AS NEEDED PAIN, MAY ADD TO HYDROCODONE  IF NEEDED Patient not taking: Reported on 11/23/2022 11/05/19   [provider]  traZODone  (DESYREL ) 150 MG tablet Take 150 mg by mouth at bedtime. 10/30/15   [provider]  vitamin B-12 (CYANOCOBALAMIN ) 1000 MCG tablet Take 2,500 mcg by mouth daily.    [provider]  Vitamins-Lipotropics (EAR HEALTH PLUS PO) Take 1 tablet by mouth daily.    [provider]    Family History Family History   Problem Relation Age of Onset   Diabetes Mother    Hypertension Mother    Colon cancer Brother        deceased age 42   Stroke Father    Cancer Sister        breast   Breast cancer Sister    Other Son        chron's    Breast cancer Maternal Grandmother     Social History Social History   Tobacco Use   Smoking status: Never   Smokeless tobacco: Never  Vaping Use   Vaping status: Never Used  Substance Use Topics   Alcohol use: No  Drug use: No     Allergies   Bee venom   Review of Systems Review of Systems Per HPI  Physical Exam Triage Vital Signs ED Triage Vitals  Encounter Vitals Group     BP 07/23/24 1322 (!) 152/87     Girls Systolic BP Percentile --      Girls Diastolic BP Percentile --      Boys Systolic BP Percentile --      Boys Diastolic BP Percentile --      Pulse Rate 07/23/24 1322 90     Resp 07/23/24 1322 18     Temp 07/23/24 1322 97.6 F (36.4 C)     Temp Source 07/23/24 1322 Oral     SpO2 07/23/24 1322 96 %     Weight --      Height --      Head Circumference --      Peak Flow --      Pain Score 07/23/24 1323 0     Pain Loc --      Pain Education --      Exclude from Growth Chart --    No data found.  Updated Vital Signs BP (!) 152/87 (BP Location: Right Arm)   Pulse 90   Temp 97.6 F (36.4 C) (Oral)   Resp 18   SpO2 96%   Visual Acuity Right Eye Distance:   Left Eye Distance:   Bilateral Distance:    Right Eye Near:   Left Eye Near:    Bilateral Near:     Physical Exam Vitals and nursing note reviewed.  Constitutional:      General: She is not in acute distress.    Appearance: Normal appearance.  HENT:     Head: Normocephalic.  Eyes:     Extraocular Movements: Extraocular movements intact.     Pupils: Pupils are equal, round, and reactive to light.  Cardiovascular:     Rate and Rhythm: Normal rate and regular rhythm.     Pulses: Normal pulses.     Heart sounds: Normal heart sounds.  Pulmonary:     Effort:  Pulmonary effort is normal. No respiratory distress.     Breath sounds: Normal breath sounds. No stridor. No wheezing, rhonchi or rales.  Abdominal:     General: Bowel sounds are normal.     Palpations: Abdomen is soft.     Tenderness: There is no abdominal tenderness. There is no right CVA tenderness or left CVA tenderness.  Musculoskeletal:     Cervical back: Normal range of motion.  Skin:    General: Skin is warm and dry.  Neurological:     General: No focal deficit present.     Mental Status: She is alert and oriented to person, place, and time.  Psychiatric:        Mood and Affect: Mood normal.        Behavior: Behavior normal.      UC Treatments / Results  Labs (all labs ordered are listed, but only abnormal results are displayed) Labs Reviewed  POCT URINE DIPSTICK - Abnormal; Notable for the following components:      Result Value   Clarity, UA cloudy (*)    Ketones, POC UA trace (5) (*)    Spec Grav, UA >=1.030 (*)    Blood, UA large (*)    POC PROTEIN,UA =100 (*)    Urobilinogen, UA 0.2 (*)    Leukocytes, UA Moderate (2+) (*)    All other components  within normal limits  URINE CULTURE    EKG   Radiology No results found.  Procedures Procedures (including critical care time)  Medications Ordered in UC Medications - No data to display  Initial Impression / Assessment and Plan / UC Course  I have reviewed the triage vital signs and the nursing notes.  Pertinent labs & imaging results that were available during my care of the patient were reviewed by me and considered in my medical decision making (see chart for details).  Urinalysis is positive for moderate leukocytes, protein, and blood, suggestive of urinary tract infection.  Urine culture is pending.  In the interim, we will treat patient empirically with Keflex  500 mg.  Supportive care recommendations were provided and discussed with the patient to include over-the-counter analgesics, developing a  toileting schedule, avoiding caffeine, and increasing her fluid intake.  Discussed strict ER follow-up precautions with the patient to include continued inability to urinate.  Patient was advised to follow-up with her PCP if symptoms fail to improve with this treatment.  Patient was in agreement with this plan of care and verbalizes understanding.  All questions were answered.  Patient stable for discharge.   Final Clinical Impressions(s) / UC Diagnoses   Final diagnoses:  UTI symptoms  Difficulty voiding     Discharge Instructions      A urine culture has been ordered.  You will be contacted when the results of the culture are received.  You will also have access to the results via MyChart. Take medication as prescribed. You may take over-the-counter Tylenol  as needed for pain, fever, or general discomfort. Increase your fluid intake.  Try to drink at least 8-10 8 ounce glasses of water while symptoms persist. Avoid caffeine such as tea, soda, or coffee while symptoms persist. Develop a toileting schedule that will allow you to urinate at least every 2 hours. If the results of your urine culture are negative and you are continue to experience symptoms, recommend follow-up with your primary care physician for further evaluation. Go to the emergency department if you become unable to urinate. Follow-up as needed.     ED Prescriptions     Medication Sig Dispense Auth. Provider   cephALEXin  (KEFLEX ) 500 MG capsule Take 1 capsule (500 mg total) by mouth 4 (four) times daily. 28 capsule Leath-Warren, Etta PARAS, NP      PDMP not reviewed this encounter.   Gilmer Etta PARAS, NP 07/23/24 1343

## 2024-07-23 NOTE — Discharge Instructions (Signed)
 A urine culture has been ordered.  You will be contacted when the results of the culture are received.  You will also have access to the results via MyChart. Take medication as prescribed. You may take over-the-counter Tylenol  as needed for pain, fever, or general discomfort. Increase your fluid intake.  Try to drink at least 8-10 8 ounce glasses of water while symptoms persist. Avoid caffeine such as tea, soda, or coffee while symptoms persist. Develop a toileting schedule that will allow you to urinate at least every 2 hours. If the results of your urine culture are negative and you are continue to experience symptoms, recommend follow-up with your primary care physician for further evaluation. Go to the emergency department if you become unable to urinate. Follow-up as needed.

## 2024-07-24 LAB — URINE CULTURE: Culture: NO GROWTH

## 2024-07-25 ENCOUNTER — Ambulatory Visit (HOSPITAL_COMMUNITY): Payer: Self-pay

## 2024-08-03 ENCOUNTER — Other Ambulatory Visit: Payer: Self-pay

## 2024-08-03 ENCOUNTER — Emergency Department (HOSPITAL_COMMUNITY)

## 2024-08-03 ENCOUNTER — Emergency Department (HOSPITAL_COMMUNITY)
Admission: EM | Admit: 2024-08-03 | Discharge: 2024-08-03 | Disposition: A | Discharge status: Home / Self Care | Attending: Emergency Medicine | Admitting: Emergency Medicine

## 2024-08-03 DIAGNOSIS — M79601 Pain in right arm: Secondary | ICD-10-CM | POA: Diagnosis not present

## 2024-08-03 DIAGNOSIS — W1839XA Other fall on same level, initial encounter: Secondary | ICD-10-CM | POA: Diagnosis not present

## 2024-08-03 DIAGNOSIS — S7001XA Contusion of right hip, initial encounter: Secondary | ICD-10-CM | POA: Diagnosis not present

## 2024-08-03 DIAGNOSIS — Z7982 Long term (current) use of aspirin: Secondary | ICD-10-CM | POA: Diagnosis not present

## 2024-08-03 DIAGNOSIS — M25551 Pain in right hip: Secondary | ICD-10-CM | POA: Diagnosis not present

## 2024-08-03 DIAGNOSIS — M1611 Unilateral primary osteoarthritis, right hip: Secondary | ICD-10-CM | POA: Diagnosis not present

## 2024-08-03 DIAGNOSIS — W19XXXA Unspecified fall, initial encounter: Secondary | ICD-10-CM

## 2024-08-03 DIAGNOSIS — Y92002 Bathroom of unspecified non-institutional (private) residence single-family (private) house as the place of occurrence of the external cause: Secondary | ICD-10-CM | POA: Diagnosis not present

## 2024-08-03 DIAGNOSIS — M79621 Pain in right upper arm: Secondary | ICD-10-CM | POA: Diagnosis not present

## 2024-08-03 DIAGNOSIS — K5730 Diverticulosis of large intestine without perforation or abscess without bleeding: Secondary | ICD-10-CM | POA: Diagnosis not present

## 2024-08-03 LAB — CBC WITH DIFFERENTIAL/PLATELET
Abs Immature Granulocytes: 0.02 K/uL (ref 0.00–0.07)
Basophils Absolute: 0 K/uL (ref 0.0–0.1)
Basophils Relative: 0 %
Eosinophils Absolute: 0.1 K/uL (ref 0.0–0.5)
Eosinophils Relative: 1 %
HCT: 40.1 % (ref 36.0–46.0)
Hemoglobin: 13.3 g/dL (ref 12.0–15.0)
Immature Granulocytes: 0 %
Lymphocytes Relative: 18 %
Lymphs Abs: 1.4 K/uL (ref 0.7–4.0)
MCH: 29.2 pg (ref 26.0–34.0)
MCHC: 33.2 g/dL (ref 30.0–36.0)
MCV: 87.9 fL (ref 80.0–100.0)
Monocytes Absolute: 0.6 K/uL (ref 0.1–1.0)
Monocytes Relative: 7 %
Neutro Abs: 5.9 K/uL (ref 1.7–7.7)
Neutrophils Relative %: 74 %
Platelets: 212 K/uL (ref 150–400)
RBC: 4.56 MIL/uL (ref 3.87–5.11)
RDW: 13.2 % (ref 11.5–15.5)
WBC: 8 K/uL (ref 4.0–10.5)
nRBC: 0 % (ref 0.0–0.2)

## 2024-08-03 LAB — COMPREHENSIVE METABOLIC PANEL WITH GFR
ALT: 9 U/L (ref 0–44)
AST: 14 U/L — ABNORMAL LOW (ref 15–41)
Albumin: 3.9 g/dL (ref 3.5–5.0)
Alkaline Phosphatase: 91 U/L (ref 38–126)
Anion gap: 12 (ref 5–15)
BUN: 12 mg/dL (ref 8–23)
CO2: 22 mmol/L (ref 22–32)
Calcium: 9.3 mg/dL (ref 8.9–10.3)
Chloride: 106 mmol/L (ref 98–111)
Creatinine, Ser: 0.99 mg/dL (ref 0.44–1.00)
GFR, Estimated: 58 mL/min — ABNORMAL LOW (ref >60)
Glucose, Bld: 172 mg/dL — ABNORMAL HIGH (ref 70–99)
Potassium: 4 mmol/L (ref 3.5–5.1)
Sodium: 140 mmol/L (ref 135–145)
Total Bilirubin: 0.9 mg/dL (ref 0.0–1.2)
Total Protein: 6.1 g/dL — ABNORMAL LOW (ref 6.5–8.1)

## 2024-08-03 LAB — PROTIME-INR
INR: 1 (ref 0.8–1.2)
Prothrombin Time: 13.5 s (ref 11.4–15.2)

## 2024-08-03 MED ORDER — MORPHINE SULFATE (PF) 4 MG/ML IV SOLN
4.0000 mg | Freq: Once | INTRAVENOUS | Status: AC
  Administered 2024-08-03: 4 mg via INTRAVENOUS
  Filled 2024-08-03: qty 1

## 2024-08-03 MED ORDER — ONDANSETRON HCL 4 MG/2ML IJ SOLN
4.0000 mg | Freq: Once | INTRAMUSCULAR | Status: AC
  Administered 2024-08-03: 4 mg via INTRAVENOUS
  Filled 2024-08-03: qty 2

## 2024-08-03 NOTE — ED Triage Notes (Signed)
 Pt arrived [facility] from home . Pt was using the bathroom and fell onto the floor. Pt crawled out of bathroom for help. Pt c/o right hip pain. Pt took a Oxy at 11am. Pt has dementia per [facility].

## 2024-08-03 NOTE — ED Notes (Signed)
Oxygen placed for comfort

## 2024-08-03 NOTE — ED Notes (Signed)
 Put fall bracelet on patient

## 2024-08-03 NOTE — ED Provider Notes (Signed)
  EMERGENCY DEPARTMENT AT [facility] Provider Note   CSN: [id number] Arrival date & time: 08/03/24  1119     Patient presents with: [name]   [name] is a 78 y.o. female.   Patient is a 78 year old female who presents emergency department the chief complaint of right hip pain and right upper extremity pain.  Patient notes that she fell when coming out of her bathroom this morning.  She notes that she did have to crawl out of the bathroom and call for help.  Patient denies striking her head or injuring her neck or back during the fall.  She denies any other long bone or joint pain.  She denies any preceding symptoms to include chest pain, shortness of breath, palpitations, syncope.  She denies any active dizziness or lightheadedness.  She denies any chest pain or abdominal pain.   Fall       Prior to Admission medications   Medication Sig Start Date End Date Taking? Authorizing Provider  acyclovir  (ZOVIRAX ) 400 MG tablet Take 400 mg by mouth 2 (two) times daily.     [provider]  amLODipine  (NORVASC ) 10 MG tablet Take 1 tablet (10 mg total) by mouth daily. Patient not taking: Reported on 11/23/2022 03/11/20   Court Dorn PARAS, MD  aspirin  EC 81 MG tablet Take 81 mg by mouth daily. Patient not taking: Reported on 11/23/2022    [provider]  atorvastatin  (LIPITOR) 80 MG tablet Take 80 mg by mouth daily. 08/15/19   [provider]  B Complex-C (B-COMPLEX WITH VITAMIN C) tablet Take 1 tablet by mouth daily.    [provider]  Biotin 5 MG CAPS Take 1 capsule by mouth daily.    [provider]  celecoxib (CELEBREX) 200 MG capsule Take 1 capsule by mouth daily. Patient not taking: Reported on 11/23/2022    [provider]  cephALEXin  (KEFLEX ) 500 MG capsule Take 1 capsule (500 mg total) by mouth 4 (four) times daily. 07/23/24   Leath-Warren, Etta PARAS, NP  Cholecalciferol (VITAMIN D3) 1.25 MG (50000 UT) CAPS  Take 10,000 Units by mouth daily at 6 (six) AM.    [provider]  docusate sodium  (COLACE) 100 MG capsule Take 100 mg by mouth 2 (two) times daily.    [provider]  DULoxetine (CYMBALTA) 30 MG capsule Take 1 capsule by mouth daily. Patient not taking: Reported on 11/23/2022    [provider]  EPINEPHrine  0.3 mg/0.3 mL IJ SOAJ injection Use as needed    [provider]  fluticasone (FLONASE) 50 MCG/ACT nasal spray Place 2 sprays into both nostrils as needed for allergies.  Patient not taking: Reported on 11/23/2022    [provider]  furosemide (LASIX) 20 MG tablet Take 20 mg by mouth 2 (two) times daily.    [provider]  glimepiride (AMARYL) 4 MG tablet Take 4 mg by mouth daily.    [provider]  glucose blood (ACCU-CHEK AVIVA PLUS) test strip 1 each by Other route as needed for other. 08/27/15   [provider]  hydrochlorothiazide  (MICROZIDE ) 12.5 MG capsule TAKE 1 CAPSULE BY MOUTH  DAILY 11/20/19   Court Dorn PARAS, MD  HYDROcodone -acetaminophen  (NORCO) 10-325 MG tablet Take 1 tablet by mouth every 4 (four) hours.    [provider]  lisinopril  (PRINIVIL ,ZESTRIL ) 40 MG tablet Take 40 mg by mouth daily.    [provider]  Magnesium 250 MG TABS Take 1 tablet  by mouth daily. Patient not taking: Reported on 11/23/2022    [provider]  methocarbamol (ROBAXIN) 500 MG tablet Take 1 tablet by mouth every 8 (eight) hours as needed.    [provider]  Methyl Salicylate -Lido-Menthol  4-4-5 % PTCH Apply 1 patch topically in the morning and at bedtime. 11/22/22   Garrick Charleston, MD  Multiple Vitamins-Minerals (PRESERVISION AREDS 2 PO) Take 2 tablets by mouth daily.     [provider]  MYRBETRIQ 50 MG TB24 tablet Take 50 mg by mouth daily. 09/13/19   [provider]  oxyCODONE  (ROXICODONE ) 15 MG immediate release tablet     [provider]  Potassium Citrate  99 MG CAPS Take 1 capsule by mouth daily. Patient not taking: Reported on 11/23/2022    [provider]  pyridostigmine (MESTINON) 60 MG tablet Take 60 mg by mouth 3 (three) times daily. Patient not taking: Reported on 11/23/2022    [provider]  pyridOXINE (VITAMIN B-6) 100 MG tablet Take 100 mg by mouth daily.    [provider]  traMADol (ULTRAM) 50 MG tablet TAKE 1 2 TABLETS BY MOUTH 4 TIMES A DAY AS NEEDED PAIN, MAY ADD TO HYDROCODONE  IF NEEDED Patient not taking: Reported on 11/23/2022 11/05/19   [provider]  traZODone  (DESYREL ) 150 MG tablet Take 150 mg by mouth at bedtime. 10/30/15   [provider]  vitamin B-12 (CYANOCOBALAMIN ) 1000 MCG tablet Take 2,500 mcg by mouth daily.    [provider]  Vitamins-Lipotropics (EAR HEALTH PLUS PO) Take 1 tablet by mouth daily.    [provider]    Allergies: Bee venom    Review of Systems  Musculoskeletal:        Right hip pain and right humeral pain  All other systems reviewed and are negative.   Updated Vital Signs BP (!) 210/88 (BP Location: Right Arm)   Pulse 65 Comment: Simultaneous filing. User may not have seen previous data.  Temp 98.3 F (36.8 C) (Oral)   Resp (!) 22   Ht 5' 8 (1.727 m)   Wt 81.6 kg   SpO2 99% Comment: Simultaneous filing. User may not have seen previous data.  BMI 27.37 kg/m   Physical Exam Vitals and nursing note reviewed.  Constitutional:      General: She is not in acute distress.    Appearance: Normal appearance. She is not ill-appearing.  HENT:     Head: Normocephalic and atraumatic.     Nose: Nose normal.     Mouth/Throat:     Mouth: Mucous membranes are moist.  Eyes:     Extraocular Movements: Extraocular movements intact.     Conjunctiva/sclera: Conjunctivae normal.     Pupils: Pupils are equal, round, and reactive to light.  Neck:     Comments: No step-off or deformity Cardiovascular:     Rate and Rhythm: Normal rate  and regular rhythm.     Pulses: Normal pulses.     Heart sounds: Normal heart sounds. No murmur heard.    No gallop.  Pulmonary:     Effort: Pulmonary effort is normal. No respiratory distress.     Breath sounds: Normal breath sounds. No stridor. No wheezing, rhonchi or rales.  Chest:     Chest wall: No tenderness.  Abdominal:     General: Abdomen is flat. Bowel sounds are normal. There is no distension.     Palpations: Abdomen is soft. There is no mass.     Tenderness:  There is no abdominal tenderness. There is no guarding or rebound.     Hernia: No hernia is present.  Musculoskeletal:        General: Normal range of motion.     Cervical back: Normal range of motion and neck supple. No rigidity or tenderness.     Comments: Tenderness palpation noted over the right hip diffusely and right humeral region, nontender palpation of remainder bilateral extremities, DP and PT pulses are 2+ distally, sensation intact distally, limited active and passive range of motion of right hip secondary to pain, pelvis stable to AP and lateral compression, no obvious deformity, no skin breakdown or ulceration, no lacerations or abrasions, nontender palpation over thoracic and lumbar spine, no step-off or deformity  Skin:    General: Skin is warm and dry.     Findings: No bruising or rash.  Neurological:     General: No focal deficit present.     Mental Status: She is alert and oriented to person, place, and time. Mental status is at baseline.     Cranial Nerves: No cranial nerve deficit.     Sensory: No sensory deficit.     Motor: No weakness.     Coordination: Coordination normal.  Psychiatric:        Mood and Affect: Mood normal.        Behavior: Behavior normal.        Thought Content: Thought content normal.        Judgment: Judgment normal.     (all labs ordered are listed, but only abnormal results are displayed) Labs Reviewed  COMPREHENSIVE METABOLIC PANEL WITH GFR  CBC WITH  DIFFERENTIAL/PLATELET  PROTIME-INR    EKG: None  Radiology: No results found.   Procedures   Medications Ordered in the ED  morphine  (PF) 4 MG/ML injection 4 mg (has no administration in time range)  ondansetron  (ZOFRAN ) injection 4 mg (has no administration in time range)                                    Medical Decision Making Amount and/or Complexity of Data Reviewed Labs: ordered. Radiology: ordered.  Risk Prescription drug management.   This patient presents to the ED for concern of fall, right hip pain differential diagnosis includes hip fracture, contusion, sprain, vertebral fracture, other long bone or joint fracture    Additional history obtained:  Additional history obtained from none External records from outside source obtained and reviewed including none   Lab Tests:  I Ordered, and personally interpreted labs.  The pertinent results include: No leukocytosis, no anemia, normal kidney function liver function, normal electrolytes   Imaging Studies ordered:  I ordered imaging studies including x-ray of right humerus, right hip, CT right hip and lumbar spine I independently visualized and interpreted imaging which showed no acute osseous injury or lesions noted throughout I agree with the radiologist interpretation   Medicines ordered and prescription drug management:  I ordered medication including morphine , Zofran  for acute traumatic pain Reevaluation of the patient after these medicines showed that the patient improved I have reviewed the patients home medicines and have made adjustments as needed   Problem List / ED Course:  Patient is doing well at this time and is stable for discharge home.  Discussed with patient that all imaging in the emergency department has been unremarkable.  She was not tender palpation of her cervical or thoracic  spine.  She had no other long bone or joint pain noted on exam.  She did not strike her head during the  fall and do not suspect that imaging of the head is warranted at this time.  She was nontender palpation of her chest wall and abdomen.  She had no concerning neurological deficits.  Did also call the patient's husband and make him aware that everything has been negative thus far.  Close follow-up PCP was discussed as well as strict turn precautions for any new or worsening symptoms.  Patient voiced understanding and had no additional questions.   Social Determinants of Health:  None        Final diagnoses:  None    ED Discharge Orders     None          [name] 08/03/24 1457    Freddi Hamilton, MD 08/04/24 [phone]

## 2024-08-03 NOTE — ED Notes (Signed)
 Patient transported to CT

## 2024-08-03 NOTE — ED Notes (Signed)
Provider at bedside during triage.

## 2024-08-03 NOTE — ED Notes (Signed)
 Pt picked up by husband Caesar

## 2024-08-03 NOTE — ED Notes (Signed)
Sandwhich given to pt

## 2024-08-03 NOTE — ED Notes (Signed)
 Patient transported to X-ray

## 2024-08-03 NOTE — Discharge Instructions (Signed)

## 2024-08-14 ENCOUNTER — Ambulatory Visit: Admitting: Nurse Practitioner

## 2024-08-21 DIAGNOSIS — M545 Low back pain, unspecified: Secondary | ICD-10-CM | POA: Diagnosis not present

## 2024-08-21 DIAGNOSIS — G8929 Other chronic pain: Secondary | ICD-10-CM | POA: Diagnosis not present

## 2024-08-21 DIAGNOSIS — N3281 Overactive bladder: Secondary | ICD-10-CM | POA: Diagnosis not present

## 2024-08-21 DIAGNOSIS — E1142 Type 2 diabetes mellitus with diabetic polyneuropathy: Secondary | ICD-10-CM | POA: Diagnosis not present

## 2024-08-21 DIAGNOSIS — I10 Essential (primary) hypertension: Secondary | ICD-10-CM | POA: Diagnosis not present

## 2024-08-21 DIAGNOSIS — E1140 Type 2 diabetes mellitus with diabetic neuropathy, unspecified: Secondary | ICD-10-CM | POA: Diagnosis not present

## 2024-08-21 DIAGNOSIS — R413 Other amnesia: Secondary | ICD-10-CM | POA: Diagnosis not present

## 2024-08-21 DIAGNOSIS — R0989 Other specified symptoms and signs involving the circulatory and respiratory systems: Secondary | ICD-10-CM | POA: Diagnosis not present

## 2024-08-21 DIAGNOSIS — E785 Hyperlipidemia, unspecified: Secondary | ICD-10-CM | POA: Diagnosis not present

## 2024-08-28 ENCOUNTER — Other Ambulatory Visit: Payer: Self-pay

## 2024-08-28 ENCOUNTER — Emergency Department (HOSPITAL_COMMUNITY)
Admission: EM | Admit: 2024-08-28 | Discharge: 2024-08-28 | Disposition: A | Discharge status: Home / Self Care | Source: Home / Self Care | Attending: Emergency Medicine | Admitting: Emergency Medicine

## 2024-08-28 ENCOUNTER — Encounter (HOSPITAL_COMMUNITY): Payer: Self-pay

## 2024-08-28 ENCOUNTER — Emergency Department (HOSPITAL_COMMUNITY)

## 2024-08-28 DIAGNOSIS — Z79899 Other long term (current) drug therapy: Secondary | ICD-10-CM | POA: Insufficient documentation

## 2024-08-28 DIAGNOSIS — N3001 Acute cystitis with hematuria: Secondary | ICD-10-CM | POA: Diagnosis not present

## 2024-08-28 DIAGNOSIS — Z7982 Long term (current) use of aspirin: Secondary | ICD-10-CM | POA: Insufficient documentation

## 2024-08-28 DIAGNOSIS — Z7984 Long term (current) use of oral hypoglycemic drugs: Secondary | ICD-10-CM | POA: Insufficient documentation

## 2024-08-28 DIAGNOSIS — E119 Type 2 diabetes mellitus without complications: Secondary | ICD-10-CM | POA: Insufficient documentation

## 2024-08-28 DIAGNOSIS — G309 Alzheimer's disease, unspecified: Secondary | ICD-10-CM | POA: Insufficient documentation

## 2024-08-28 DIAGNOSIS — N390 Urinary tract infection, site not specified: Secondary | ICD-10-CM | POA: Diagnosis not present

## 2024-08-28 DIAGNOSIS — F0280 Dementia in other diseases classified elsewhere without behavioral disturbance: Secondary | ICD-10-CM | POA: Insufficient documentation

## 2024-08-28 DIAGNOSIS — M19011 Primary osteoarthritis, right shoulder: Secondary | ICD-10-CM | POA: Diagnosis not present

## 2024-08-28 DIAGNOSIS — M25511 Pain in right shoulder: Secondary | ICD-10-CM | POA: Diagnosis not present

## 2024-08-28 DIAGNOSIS — M85811 Other specified disorders of bone density and structure, right shoulder: Secondary | ICD-10-CM | POA: Diagnosis not present

## 2024-08-28 DIAGNOSIS — I10 Essential (primary) hypertension: Secondary | ICD-10-CM | POA: Insufficient documentation

## 2024-08-28 HISTORY — DX: Dementia in other diseases classified elsewhere, unspecified severity, without behavioral disturbance, psychotic disturbance, mood disturbance, and anxiety: F02.80

## 2024-08-28 MED ORDER — OXYCODONE-ACETAMINOPHEN 5-325 MG PO TABS
1.0000 | ORAL_TABLET | Freq: Once | ORAL | Status: AC
  Administered 2024-08-28: 1 via ORAL
  Filled 2024-08-28: qty 1

## 2024-08-28 MED ORDER — OXYCODONE-ACETAMINOPHEN 5-325 MG PO TABS: 1.0000 | ORAL_TABLET | ORAL | 0 refills | Status: DC | PRN

## 2024-08-28 NOTE — ED Triage Notes (Signed)
 Pt arrived to visit with her husband and reports since she is here, she should be evaluated for her right shoulder pain that has been present for 1 month. Pt denies injury. Pt reports her PCP retired and she has not established care with another doctor yet. Pt does display limited mobility in her RUE.

## 2024-08-28 NOTE — ED Provider Notes (Signed)
 [place] EMERGENCY DEPARTMENT AT [facility] Provider Note   CSN: [id number] Arrival date & time: 08/28/24  1655     Patient presents with: Shoulder Pain   [name] is a 78 y.o. female.  {Add pertinent medical, surgical, social history, OB history to YEP:[id number]}  Shoulder Pain Associated symptoms: no fever        [name] is a 78 y.o. female with medical history of hypertension, type 2 diabetes, generalized anxiety disorder, and chronic right shoulder pain who presents to the Emergency Department complaining of worsening of her right shoulder pain for 1 month.  Denies any known injury.  States that she was getting monthly prescriptions for pain medications but her primary care provider has retired and she does not have a PCP currently.  She has pain to the anterior right shoulder that worsens with movement and improves at rest.  She denies any numbness weakness or tingling of her upper extremities.  Denies headache, dizziness, visual changes and neck pain.  Prior to Admission medications   Medication Sig Start Date End Date Taking? Authorizing Provider  acyclovir  (ZOVIRAX ) 400 MG tablet Take 400 mg by mouth 2 (two) times daily.     [provider]  amLODipine  (NORVASC ) 10 MG tablet Take 1 tablet (10 mg total) by mouth daily. Patient not taking: Reported on 11/23/2022 03/11/20   Court Dorn PARAS, MD  aspirin  EC 81 MG tablet Take 81 mg by mouth daily. Patient not taking: Reported on 11/23/2022    [provider]  atorvastatin  (LIPITOR) 80 MG tablet Take 80 mg by mouth daily. 08/15/19   [provider]  B Complex-C (B-COMPLEX WITH VITAMIN C) tablet Take 1 tablet by mouth daily.    [provider]  Biotin 5 MG CAPS Take 1 capsule by mouth daily.    [provider]  celecoxib (CELEBREX) 200 MG capsule Take 1 capsule by mouth daily. Patient not taking: Reported on 11/23/2022    [provider]  cephALEXin  (KEFLEX )  500 MG capsule Take 1 capsule (500 mg total) by mouth 4 (four) times daily. 07/23/24   Leath-Warren, Etta PARAS, NP  Cholecalciferol (VITAMIN D3) 1.25 MG (50000 UT) CAPS Take 10,000 Units by mouth daily at 6 (six) AM.    [provider]  docusate sodium  (COLACE) 100 MG capsule Take 100 mg by mouth 2 (two) times daily.    [provider]  DULoxetine (CYMBALTA) 30 MG capsule Take 1 capsule by mouth daily. Patient not taking: Reported on 11/23/2022    [provider]  EPINEPHrine  0.3 mg/0.3 mL IJ SOAJ injection Use as needed    [provider]  fluticasone (FLONASE) 50 MCG/ACT nasal spray Place 2 sprays into both nostrils as needed for allergies.  Patient not taking: Reported on 11/23/2022    [provider]  furosemide (LASIX) 20 MG tablet Take 20 mg by mouth 2 (two) times daily.    [provider]  glimepiride (AMARYL) 4 MG tablet Take 4 mg by mouth daily.    [provider]  glucose blood (ACCU-CHEK AVIVA PLUS) test strip 1 each by Other route as needed for other. 08/27/15   [provider]  hydrochlorothiazide  (MICROZIDE ) 12.5 MG capsule TAKE 1 CAPSULE BY MOUTH  DAILY 11/20/19   Court Dorn PARAS, MD  HYDROcodone -acetaminophen  (NORCO) 10-325 MG tablet Take 1 tablet by mouth every 4 (four) hours.    [provider]  lisinopril  (PRINIVIL ,ZESTRIL ) 40 MG tablet Take 40 mg  by mouth daily.    [provider]  Magnesium 250 MG TABS Take 1 tablet by mouth daily. Patient not taking: Reported on 11/23/2022    [provider]  methocarbamol (ROBAXIN) 500 MG tablet Take 1 tablet by mouth every 8 (eight) hours as needed.    [provider]  Methyl Salicylate -Lido-Menthol  4-4-5 % PTCH Apply 1 patch topically in the morning and at bedtime. 11/22/22   Garrick Charleston, MD  Multiple Vitamins-Minerals (PRESERVISION AREDS 2 PO) Take 2 tablets by mouth daily.     [provider]  MYRBETRIQ 50 MG TB24 tablet  Take 50 mg by mouth daily. 09/13/19   [provider]  oxyCODONE  (ROXICODONE ) 15 MG immediate release tablet     [provider]  Potassium Citrate 99 MG CAPS Take 1 capsule by mouth daily. Patient not taking: Reported on 11/23/2022    [provider]  pyridostigmine (MESTINON) 60 MG tablet Take 60 mg by mouth 3 (three) times daily. Patient not taking: Reported on 11/23/2022    [provider]  pyridOXINE (VITAMIN B-6) 100 MG tablet Take 100 mg by mouth daily.    [provider]  traMADol (ULTRAM) 50 MG tablet TAKE 1 2 TABLETS BY MOUTH 4 TIMES A DAY AS NEEDED PAIN, MAY ADD TO HYDROCODONE  IF NEEDED Patient not taking: Reported on 11/23/2022 11/05/19   [provider]  traZODone  (DESYREL ) 150 MG tablet Take 150 mg by mouth at bedtime. 10/30/15   [provider]  vitamin B-12 (CYANOCOBALAMIN ) 1000 MCG tablet Take 2,500 mcg by mouth daily.    [provider]  Vitamins-Lipotropics (EAR HEALTH PLUS PO) Take 1 tablet by mouth daily.    [provider]    Allergies: Bee venom    Review of Systems  Constitutional:  Negative for chills and fever.    Updated Vital Signs BP (!) 149/89   Pulse 77   Temp 98 F (36.7 C) (Oral)   Resp 18   Ht 5' 8 (1.727 m)   Wt 81.6 kg   SpO2 97%   BMI 27.37 kg/m   Physical Exam  (all labs ordered are listed, but only abnormal results are displayed) Labs Reviewed - No data to display  EKG: None  Radiology: DG Shoulder Right Result Date: 08/28/2024 CLINICAL DATA:  Right shoulder pain. EXAM: RIGHT SHOULDER - 2+ VIEW COMPARISON:  None Available. FINDINGS: No acute fracture or dislocation. The bones are osteopenic. Mild arthritic changes of the right shoulder and right AC joint. The soft tissues are unremarkable. IMPRESSION: 1. No acute fracture or dislocation. 2. Mild arthritic changes. Electronically Signed   By: Vanetta Chou M.D.   On: 08/28/2024 17:51    {Document cardiac  monitor, telemetry assessment procedure when appropriate:32947} Procedures   Medications Ordered in the ED - No data to display    {Click here for ABCD2, HEART and other calculators REFRESH Note before signing:1}                              Medical Decision Making Amount and/or Complexity of Data Reviewed Radiology: ordered.     {Document critical care time when appropriate  Document review of labs and clinical decision tools ie CHADS2VASC2, etc  Document your independent review of radiology images and any outside records  Document your discussion with family members, caretakers and with consultants  Document social determinants of health affecting pt's care  Document your decision making  why or why not admission, treatments were needed:32947:::1}   Final diagnoses:  None    ED Discharge Orders     None

## 2024-08-28 NOTE — Discharge Instructions (Signed)
 You may contact the orthopedic provider listed to arrange follow-up appointment regarding your shoulder pain.  Have also listed some of the local primary care clinics for you that you may contact to establish primary care.

## 2024-08-28 NOTE — ED Notes (Signed)
 Patient transported to X-ray

## 2024-08-29 ENCOUNTER — Inpatient Hospital Stay (HOSPITAL_COMMUNITY)
Admission: EM | Admit: 2024-08-29 | Discharge: 2024-09-04 | DRG: 690 | Disposition: A | Discharge status: Home Health | Source: Ambulatory Visit | Attending: Internal Medicine | Admitting: Internal Medicine

## 2024-08-29 ENCOUNTER — Encounter (HOSPITAL_COMMUNITY): Payer: Self-pay

## 2024-08-29 DIAGNOSIS — Z1612 Extended spectrum beta lactamase (ESBL) resistance: Secondary | ICD-10-CM | POA: Diagnosis not present

## 2024-08-29 DIAGNOSIS — Z79899 Other long term (current) drug therapy: Secondary | ICD-10-CM | POA: Diagnosis not present

## 2024-08-29 DIAGNOSIS — Z23 Encounter for immunization: Secondary | ICD-10-CM | POA: Diagnosis not present

## 2024-08-29 DIAGNOSIS — E7800 Pure hypercholesterolemia, unspecified: Secondary | ICD-10-CM | POA: Diagnosis not present

## 2024-08-29 DIAGNOSIS — Z1629 Resistance to other single specified antibiotic: Secondary | ICD-10-CM | POA: Diagnosis present

## 2024-08-29 DIAGNOSIS — M19011 Primary osteoarthritis, right shoulder: Secondary | ICD-10-CM | POA: Diagnosis present

## 2024-08-29 DIAGNOSIS — F03918 Unspecified dementia, unspecified severity, with other behavioral disturbance: Secondary | ICD-10-CM | POA: Diagnosis not present

## 2024-08-29 DIAGNOSIS — B9620 Unspecified Escherichia coli [E. coli] as the cause of diseases classified elsewhere: Secondary | ICD-10-CM | POA: Diagnosis not present

## 2024-08-29 DIAGNOSIS — Z800 Family history of malignant neoplasm of digestive organs: Secondary | ICD-10-CM

## 2024-08-29 DIAGNOSIS — Z1624 Resistance to multiple antibiotics: Secondary | ICD-10-CM | POA: Diagnosis not present

## 2024-08-29 DIAGNOSIS — Z823 Family history of stroke: Secondary | ICD-10-CM | POA: Diagnosis not present

## 2024-08-29 DIAGNOSIS — Z91030 Bee allergy status: Secondary | ICD-10-CM

## 2024-08-29 DIAGNOSIS — Z8249 Family history of ischemic heart disease and other diseases of the circulatory system: Secondary | ICD-10-CM

## 2024-08-29 DIAGNOSIS — G8929 Other chronic pain: Secondary | ICD-10-CM | POA: Diagnosis present

## 2024-08-29 DIAGNOSIS — N390 Urinary tract infection, site not specified: Secondary | ICD-10-CM | POA: Diagnosis not present

## 2024-08-29 DIAGNOSIS — Z833 Family history of diabetes mellitus: Secondary | ICD-10-CM

## 2024-08-29 DIAGNOSIS — Z7984 Long term (current) use of oral hypoglycemic drugs: Secondary | ICD-10-CM | POA: Diagnosis not present

## 2024-08-29 DIAGNOSIS — N200 Calculus of kidney: Secondary | ICD-10-CM | POA: Diagnosis not present

## 2024-08-29 DIAGNOSIS — Z803 Family history of malignant neoplasm of breast: Secondary | ICD-10-CM

## 2024-08-29 DIAGNOSIS — F02818 Dementia in other diseases classified elsewhere, unspecified severity, with other behavioral disturbance: Secondary | ICD-10-CM | POA: Diagnosis not present

## 2024-08-29 DIAGNOSIS — Z9049 Acquired absence of other specified parts of digestive tract: Secondary | ICD-10-CM | POA: Diagnosis not present

## 2024-08-29 DIAGNOSIS — Z90710 Acquired absence of both cervix and uterus: Secondary | ICD-10-CM

## 2024-08-29 DIAGNOSIS — M25511 Pain in right shoulder: Secondary | ICD-10-CM | POA: Diagnosis not present

## 2024-08-29 DIAGNOSIS — E119 Type 2 diabetes mellitus without complications: Secondary | ICD-10-CM | POA: Diagnosis not present

## 2024-08-29 DIAGNOSIS — I10 Essential (primary) hypertension: Secondary | ICD-10-CM | POA: Diagnosis present

## 2024-08-29 DIAGNOSIS — N3001 Acute cystitis with hematuria: Principal | ICD-10-CM

## 2024-08-29 DIAGNOSIS — E669 Obesity, unspecified: Secondary | ICD-10-CM | POA: Diagnosis not present

## 2024-08-29 DIAGNOSIS — G309 Alzheimer's disease, unspecified: Secondary | ICD-10-CM | POA: Diagnosis present

## 2024-08-29 DIAGNOSIS — M85811 Other specified disorders of bone density and structure, right shoulder: Secondary | ICD-10-CM | POA: Diagnosis not present

## 2024-08-29 DIAGNOSIS — N2889 Other specified disorders of kidney and ureter: Secondary | ICD-10-CM | POA: Diagnosis present

## 2024-08-29 DIAGNOSIS — E440 Moderate protein-calorie malnutrition: Secondary | ICD-10-CM | POA: Diagnosis not present

## 2024-08-29 DIAGNOSIS — B961 Klebsiella pneumoniae [K. pneumoniae] as the cause of diseases classified elsewhere: Secondary | ICD-10-CM | POA: Diagnosis not present

## 2024-08-29 DIAGNOSIS — K5730 Diverticulosis of large intestine without perforation or abscess without bleeding: Secondary | ICD-10-CM | POA: Diagnosis not present

## 2024-08-29 DIAGNOSIS — D3501 Benign neoplasm of right adrenal gland: Secondary | ICD-10-CM | POA: Diagnosis present

## 2024-08-29 DIAGNOSIS — K449 Diaphragmatic hernia without obstruction or gangrene: Secondary | ICD-10-CM | POA: Diagnosis not present

## 2024-08-29 LAB — CBC WITH DIFFERENTIAL/PLATELET
Abs Immature Granulocytes: 0.04 K/uL (ref 0.00–0.07)
Basophils Absolute: 0 K/uL (ref 0.0–0.1)
Basophils Relative: 1 %
Eosinophils Absolute: 0.1 K/uL (ref 0.0–0.5)
Eosinophils Relative: 1 %
HCT: 41.1 % (ref 36.0–46.0)
Hemoglobin: 13.2 g/dL (ref 12.0–15.0)
Immature Granulocytes: 1 %
Lymphocytes Relative: 33 %
Lymphs Abs: 2.4 K/uL (ref 0.7–4.0)
MCH: 29.3 pg (ref 26.0–34.0)
MCHC: 32.1 g/dL (ref 30.0–36.0)
MCV: 91.1 fL (ref 80.0–100.0)
Monocytes Absolute: 0.6 K/uL (ref 0.1–1.0)
Monocytes Relative: 8 %
Neutro Abs: 4.1 K/uL (ref 1.7–7.7)
Neutrophils Relative %: 56 %
Platelets: 228 K/uL (ref 150–400)
RBC: 4.51 MIL/uL (ref 3.87–5.11)
RDW: 13.3 % (ref 11.5–15.5)
WBC: 7.2 K/uL (ref 4.0–10.5)
nRBC: 0 % (ref 0.0–0.2)

## 2024-08-29 LAB — URINALYSIS, ROUTINE W REFLEX MICROSCOPIC
Bilirubin Urine: NEGATIVE
Glucose, UA: NEGATIVE mg/dL
Ketones, ur: NEGATIVE mg/dL
Nitrite: POSITIVE — AB
Protein, ur: NEGATIVE mg/dL
Specific Gravity, Urine: 1.019 (ref 1.005–1.030)
pH: 6 (ref 5.0–8.0)

## 2024-08-29 LAB — BASIC METABOLIC PANEL WITH GFR
Anion gap: 8 (ref 5–15)
BUN: 13 mg/dL (ref 8–23)
CO2: 28 mmol/L (ref 22–32)
Calcium: 9.1 mg/dL (ref 8.9–10.3)
Chloride: 107 mmol/L (ref 98–111)
Creatinine, Ser: 0.96 mg/dL (ref 0.44–1.00)
GFR, Estimated: >60 mL/min (ref >60)
Glucose, Bld: 107 mg/dL — ABNORMAL HIGH (ref 70–99)
Potassium: 4.4 mmol/L (ref 3.5–5.1)
Sodium: 142 mmol/L (ref 135–145)

## 2024-08-29 MED ORDER — ONDANSETRON HCL 4 MG PO TABS: 4.0000 mg | ORAL_TABLET | Freq: Four times a day (QID) | ORAL | Status: DC | PRN

## 2024-08-29 MED ORDER — HYDRALAZINE HCL 20 MG/ML IJ SOLN
5.0000 mg | Freq: Once | INTRAMUSCULAR | Status: AC
  Administered 2024-08-29: 5 mg via INTRAVENOUS
  Filled 2024-08-29: qty 1

## 2024-08-29 MED ORDER — AMLODIPINE BESYLATE 5 MG PO TABS
10.0000 mg | ORAL_TABLET | Freq: Every day | ORAL | Status: DC
  Administered 2024-08-30 – 2024-09-04 (×6): 10 mg via ORAL
  Filled 2024-08-29 (×6): qty 2

## 2024-08-29 MED ORDER — MORPHINE SULFATE (PF) 2 MG/ML IV SOLN
2.0000 mg | Freq: Once | INTRAVENOUS | Status: AC
  Administered 2024-08-29: 2 mg via INTRAVENOUS
  Filled 2024-08-29: qty 1

## 2024-08-29 MED ORDER — ACETAMINOPHEN 650 MG RE SUPP: 650.0000 mg | Freq: Four times a day (QID) | RECTAL | Status: DC | PRN

## 2024-08-29 MED ORDER — OXYCODONE-ACETAMINOPHEN 5-325 MG PO TABS
1.0000 | ORAL_TABLET | Freq: Four times a day (QID) | ORAL | Status: DC | PRN
  Administered 2024-08-29 – 2024-09-04 (×9): 1 via ORAL
  Filled 2024-08-29 (×10): qty 1

## 2024-08-29 MED ORDER — SODIUM CHLORIDE 0.9 % IV SOLN
1.0000 g | INTRAVENOUS | Status: DC
  Administered 2024-08-30 – 2024-09-02 (×4): 1 g via INTRAVENOUS
  Filled 2024-08-29 (×4): qty 1000
  Filled 2024-08-29: qty 1
  Filled 2024-08-29: qty 1000

## 2024-08-29 MED ORDER — SODIUM CHLORIDE 0.9 % IV SOLN
1.0000 g | Freq: Once | INTRAVENOUS | Status: DC
  Filled 2024-08-29: qty 1000

## 2024-08-29 MED ORDER — ENOXAPARIN SODIUM 40 MG/0.4ML IJ SOSY
40.0000 mg | PREFILLED_SYRINGE | INTRAMUSCULAR | Status: DC
  Administered 2024-08-29 – 2024-09-03 (×6): 40 mg via SUBCUTANEOUS
  Filled 2024-08-29 (×6): qty 0.4

## 2024-08-29 MED ORDER — ONDANSETRON HCL 4 MG/2ML IJ SOLN: 4.0000 mg | Freq: Four times a day (QID) | INTRAMUSCULAR | Status: DC | PRN

## 2024-08-29 MED ORDER — AMLODIPINE BESYLATE 5 MG PO TABS
10.0000 mg | ORAL_TABLET | Freq: Once | ORAL | Status: AC
  Administered 2024-08-29: 10 mg via ORAL
  Filled 2024-08-29: qty 2

## 2024-08-29 MED ORDER — SODIUM CHLORIDE 0.9 % IV SOLN
1.0000 g | Freq: Once | INTRAVENOUS | Status: AC
  Administered 2024-08-29: 1 g via INTRAVENOUS
  Filled 2024-08-29: qty 1000

## 2024-08-29 MED ORDER — POLYETHYLENE GLYCOL 3350 17 G PO PACK: 17.0000 g | PACK | Freq: Every day | ORAL | Status: DC | PRN

## 2024-08-29 MED ORDER — HYDROCODONE-ACETAMINOPHEN 5-325 MG PO TABS: 1.0000 | ORAL_TABLET | Freq: Once | ORAL | Status: DC

## 2024-08-29 MED ORDER — ACETAMINOPHEN 325 MG PO TABS
650.0000 mg | ORAL_TABLET | Freq: Four times a day (QID) | ORAL | Status: DC | PRN
  Administered 2024-09-02: 650 mg via ORAL
  Filled 2024-08-29: qty 2

## 2024-08-29 MED ORDER — HYDRALAZINE HCL 20 MG/ML IJ SOLN
5.0000 mg | INTRAMUSCULAR | Status: DC | PRN
  Administered 2024-08-29: 5 mg via INTRAVENOUS
  Filled 2024-08-29: qty 1

## 2024-08-29 NOTE — ED Triage Notes (Signed)
 Pt sent here by PCP due to having a positive urine culture and needing IV antibiotics. Pt has dementia. Pt reports lower right sided back pain.

## 2024-08-29 NOTE — ED Notes (Signed)
 Pt reports severe pain in right shoulder. No obvious signs of injury are present. It is non-tender. CMS is intact in the right arm.

## 2024-08-29 NOTE — Plan of Care (Signed)

## 2024-08-29 NOTE — H&P (Signed)
 History and Physical    [name] FMW:[id number] DOB: 05-13-1946 DOA: 08/29/2024  PCP: Loreli Elyn SAILOR, MD  Patient coming from: Home  I have personally briefly reviewed patient's old medical records in [facility]  Chief Complaint: UTI  HPI: [name] is a 78 y.o. female with medical history significant for Alzheimer's dementia, diabetes mellitus, hypertension. Patient was called to come to the ED after urine cultures grew multidrug-resistant bacteria. Patient reports ongoing urinary frequency and dysuria over the past week.  She reports right low back pain that has improved.  No history of kidney stones.  No vomiting no diarrhea.   She reports ongoing right shoulder pain over the past 2 months.  Denies trauma or falls involving her right shoulder.  Pain is worse when moves her right upper extremity.  She was in the ED yesterday for same pain, x-ray was obtained, negative for acute abnormality, showed mild arthritic changes.  Urine culture from [facility] done 07/2021 grew > 100,000 colonies of Klebsiella pneumonia ESBL.  Sensitive to carbapenems.  ED Course: Blood pressure elevated systolic 178-209.  Temperature 97.7. WBC 7.2.  UA with positive nitrite small leukocytes many bacteria. IV Ertapenem started.  Review of Systems: As per HPI all other systems reviewed and negative.  Past Medical History:  Diagnosis Date   Alzheimer's dementia (HCC)    Anginal pain    Atypical chest pain    Depression    Hyperlipidemia    Hypertension    Kidney tumor    Obesity     Past Surgical History:  Procedure Laterality Date   ABDOMINAL HYSTERECTOMY     CARDIAC CATHETERIZATION  2009   normal coronary arteries, preserved LVEF 60%   CHOLECYSTECTOMY  2006   Dr. Kimble.   COLONOSCOPY  2004   Dr. Kristie: hemorrhoids, diverticulosis, some stool present and small lesions could have been missed. repeat colonoscopy in five years.    COLONOSCOPY  2016   Dr. Kristie: unremarkable per  patient   EMBOLIZATION     HERNIA REPAIR     IR RADIOLOGIST EVAL & MGMT  12/31/2020   TONSILLECTOMY       reports that she has never smoked. She has never used smokeless tobacco. She reports that she does not drink alcohol and does not use drugs.  Allergies  Allergen Reactions   Bee Venom Anaphylaxis    Family History  Problem Relation Age of Onset   Diabetes Mother    Hypertension Mother    Colon cancer Brother        deceased age 2   Stroke Father    Cancer Sister        breast   Breast cancer Sister    Other Son        chron's    Breast cancer Maternal Grandmother     Prior to Admission medications   Medication Sig Start Date End Date Taking? Authorizing Provider  oxyCODONE -acetaminophen  (PERCOCET/ROXICET) 5-325 MG tablet Take 1 tablet by mouth every 4 (four) hours as needed. Patient taking differently: Take 1 tablet by mouth every 4 (four) hours as needed for severe pain (pain score 7-10). 08/28/24  Yes Triplett, Tammy, PA-C  acyclovir  (ZOVIRAX ) 400 MG tablet Take 400 mg by mouth 2 (two) times daily.     [provider]  B Complex-C (B-COMPLEX WITH VITAMIN C) tablet Take 1 tablet by mouth daily.    [provider]  Biotin 5 MG CAPS Take 1 capsule by mouth  daily.    [provider]  cephALEXin  (KEFLEX ) 500 MG capsule Take 1 capsule (500 mg total) by mouth 4 (four) times daily. Patient not taking: Reported on 08/29/2024 07/23/24   Leath-Warren, Etta PARAS, NP  Cholecalciferol (VITAMIN D3) 1.25 MG (50000 UT) CAPS Take 10,000 Units by mouth daily at 6 (six) AM.    [provider]  docusate sodium  (COLACE) 100 MG capsule Take 100 mg by mouth 2 (two) times daily.    [provider]  donepezil (ARICEPT) 5 MG tablet Take 5 mg by mouth daily. 08/19/24   [provider]  EPINEPHrine  0.3 mg/0.3 mL IJ SOAJ injection Inject 0.3 mg into the muscle as needed for anaphylaxis. Use as needed    [provider]  glucose blood  (ACCU-CHEK AVIVA PLUS) test strip 1 each by Other route as needed for other. 08/27/15   [provider]  lisinopril  (PRINIVIL ,ZESTRIL ) 40 MG tablet Take 40 mg by mouth daily.    [provider]  Methyl Salicylate -Lido-Menthol  4-4-5 % PTCH Apply 1 patch topically in the morning and at bedtime. 11/22/22   Garrick Charleston, MD  Multiple Vitamins-Minerals (PRESERVISION AREDS 2 PO) Take 2 tablets by mouth daily.     [provider]  pantoprazole (PROTONIX) 40 MG tablet Take 1 tablet by mouth 2 (two) times daily.    [provider]  pyridOXINE (VITAMIN B-6) 100 MG tablet Take 100 mg by mouth daily.    [provider]  vitamin B-12 (CYANOCOBALAMIN ) 1000 MCG tablet Take 2,500 mcg by mouth daily.    [provider]  Vitamins-Lipotropics (EAR HEALTH PLUS PO) Take 1 tablet by mouth daily.    [provider]    Physical Exam: Vitals:   08/29/24 1915 08/29/24 1930 08/29/24 1950 08/29/24 2002  BP: (!) 191/70 (!) 187/73  (!) 175/76  Pulse: 72 70 71   Resp: 18 18 (!) 25   Temp:      TempSrc:      SpO2: 92% 94% 96%   Weight:        Constitutional: NAD, calm, comfortable Vitals:   08/29/24 1915 08/29/24 1930 08/29/24 1950 08/29/24 2002  BP: (!) 191/70 (!) 187/73  (!) 175/76  Pulse: 72 70 71   Resp: 18 18 (!) 25   Temp:      TempSrc:      SpO2: 92% 94% 96%   Weight:       Eyes: PERRL, lids and conjunctivae normal ENMT: Mucous membranes are moist.  Neck: normal, supple, no masses, no thyromegaly Respiratory: clear to auscultation bilaterally, no wheezing, no crackles.  Cardiovascular: Regular rate and rhythm, no murmurs / rubs / gallops. No extremity edema.  Extremities warm..  Abdomen: no tenderness, no masses palpated. No hepatosplenomegaly.   Musculoskeletal: no clubbing / cyanosis. No joint deformity upper and lower extremities.  Skin: Superficial wound to anterior shin of right leg, ~ 3 cm.  Does not appear infected.  No rashes,  lesions, ulcers. No induration Neurologic: No facial asymmetry, moving extremity spontaneously, speech fluent. Psychiatric: Normal judgment and insight. Alert and oriented x 3. Normal mood.   Labs on Admission: I have personally reviewed following labs and imaging studies  CBC: Recent Labs  Lab 08/29/24 1709  WBC 7.2  NEUTROABS 4.1  HGB 13.2  HCT 41.1  MCV 91.1  PLT 228   Basic Metabolic Panel: Recent Labs  Lab 08/29/24 1709  NA 142  K 4.4  CL 107  CO2 28  GLUCOSE  107*  BUN 13  CREATININE 0.96  CALCIUM  9.1   Urine analysis:    Component Value Date/Time   COLORURINE YELLOW 08/29/2024 1620   APPEARANCEUR HAZY (A) 08/29/2024 1620   LABSPEC 1.019 08/29/2024 1620   PHURINE 6.0 08/29/2024 1620   GLUCOSEU NEGATIVE 08/29/2024 1620   HGBUR MODERATE (A) 08/29/2024 1620   BILIRUBINUR NEGATIVE 08/29/2024 1620   BILIRUBINUR negative 07/23/2024 1330   KETONESUR NEGATIVE 08/29/2024 1620   PROTEINUR NEGATIVE 08/29/2024 1620   UROBILINOGEN 0.2 (A) 07/23/2024 1330   NITRITE POSITIVE (A) 08/29/2024 1620   LEUKOCYTESUR SMALL (A) 08/29/2024 1620    Radiological Exams on Admission: DG Shoulder Right Result Date: 08/28/2024 CLINICAL DATA:  Right shoulder pain. EXAM: RIGHT SHOULDER - 2+ VIEW COMPARISON:  None Available. FINDINGS: No acute fracture or dislocation. The bones are osteopenic. Mild arthritic changes of the right shoulder and right AC joint. The soft tissues are unremarkable. IMPRESSION: 1. No acute fracture or dislocation. 2. Mild arthritic changes. Electronically Signed   By: Vanetta Chou M.D.   On: 08/28/2024 17:51   EKG: None   Assessment/Plan Principal Problem:   Complicated UTI (urinary tract infection)  Assessment and Plan:  Complicated urinary tract infection-symptomatic with dysuria and urinary frequency.  Right low back pain that has improved.  UA today with positive nitrite small leukocytes and many bacteria.  Rules out for sepsis.  Afebrile.  WBC 7.2.   Was called to come into the ED after cultures from 10/22 grew ESBL Klebsiella (per [facility]). - Continue IV ertapenem started in ED  Right shoulder pain-no history of trauma.  No obvious deformity.  X-ray 10/29. With mild arthritic changes. - Acetaminophen  oxycodone  5/325 as needed  Diabetes mellitus-diet controlled.  Hypertension-elevated - IV hydralazine 5 mg for systolic > 180 - Resume Norvasc  10 mg daily - Waiting med rec, patient does not think she takes lisinopril  10 mg daily  Dementia -Resume donepezil, Cymbalta  DVT prophylaxis: Lovenox  Code Status: FULL code Family Communication: none at bedside Disposition Plan: ~ 2 days Consults called: None Admission status: inpt Med surg I certify that at the point of admission it is my clinical judgment that the patient will require inpatient hospital care spanning beyond 2 midnights from the point of admission due to high intensity of service, high risk for further deterioration and high frequency of surveillance required.  Author: Tully FORBES Carwin, MD 08/29/2024 8:52 PM  For on call review [email or web address].

## 2024-08-29 NOTE — ED Provider Notes (Signed)
 [place] EMERGENCY DEPARTMENT AT [facility] Provider Note   CSN: [id number] Arrival date & time: 08/29/24  1553     Patient presents with: No chief complaint on file.   [name] is a 78 y.o. female.  History of hypertension, high cholesterol, dementia.  Presents with her husband after phone call from PCP saying she has ESBL producing Klebsiella UTI and needs IV antibiotics.  He states she has been urinating frequency, patient denies any complaints, so he denies abdominal pain, flank pain.  No fevers or chills.  She has not had any confusion.   HPI     Prior to Admission medications   Medication Sig Start Date End Date Taking? Authorizing Provider  acyclovir  (ZOVIRAX ) 400 MG tablet Take 400 mg by mouth 2 (two) times daily.     [provider]  amLODipine  (NORVASC ) 10 MG tablet Take 1 tablet (10 mg total) by mouth daily. Patient not taking: Reported on 11/23/2022 03/11/20   Court Dorn PARAS, MD  aspirin  EC 81 MG tablet Take 81 mg by mouth daily. Patient not taking: Reported on 11/23/2022    [provider]  atorvastatin  (LIPITOR) 80 MG tablet Take 80 mg by mouth daily. 08/15/19   [provider]  B Complex-C (B-COMPLEX WITH VITAMIN C) tablet Take 1 tablet by mouth daily.    [provider]  Biotin 5 MG CAPS Take 1 capsule by mouth daily.    [provider]  celecoxib (CELEBREX) 200 MG capsule Take 1 capsule by mouth daily. Patient not taking: Reported on 11/23/2022    [provider]  cephALEXin  (KEFLEX ) 500 MG capsule Take 1 capsule (500 mg total) by mouth 4 (four) times daily. 07/23/24   Leath-Warren, Etta PARAS, NP  Cholecalciferol (VITAMIN D3) 1.25 MG (50000 UT) CAPS Take 10,000 Units by mouth daily at 6 (six) AM.    [provider]  docusate sodium  (COLACE) 100 MG capsule Take 100 mg by mouth 2 (two) times daily.    [provider]  DULoxetine (CYMBALTA) 30 MG capsule Take 1 capsule by mouth  daily. Patient not taking: Reported on 11/23/2022    [provider]  EPINEPHrine  0.3 mg/0.3 mL IJ SOAJ injection Use as needed    [provider]  fluticasone (FLONASE) 50 MCG/ACT nasal spray Place 2 sprays into both nostrils as needed for allergies.  Patient not taking: Reported on 11/23/2022    [provider]  furosemide (LASIX) 20 MG tablet Take 20 mg by mouth 2 (two) times daily.    [provider]  glimepiride (AMARYL) 4 MG tablet Take 4 mg by mouth daily.    [provider]  glucose blood (ACCU-CHEK AVIVA PLUS) test strip 1 each by Other route as needed for other. 08/27/15   [provider]  hydrochlorothiazide  (MICROZIDE ) 12.5 MG capsule TAKE 1 CAPSULE BY MOUTH  DAILY 11/20/19   Court Dorn PARAS, MD  HYDROcodone -acetaminophen  (NORCO) 10-325 MG tablet Take 1 tablet by mouth every 4 (four) hours.    [provider]  lisinopril  (PRINIVIL ,ZESTRIL ) 40 MG tablet Take 40 mg by mouth daily.    [provider]  Magnesium 250 MG TABS Take 1 tablet by mouth daily. Patient not taking: Reported on 11/23/2022    [provider]  methocarbamol (ROBAXIN) 500 MG tablet Take 1 tablet by mouth every 8 (eight) hours as needed.    [provider]  Methyl Salicylate -Lido-Menthol  4-4-5 % PTCH Apply 1 patch topically in the  morning and at bedtime. 11/22/22   Garrick Charleston, MD  Multiple Vitamins-Minerals (PRESERVISION AREDS 2 PO) Take 2 tablets by mouth daily.     [provider]  MYRBETRIQ 50 MG TB24 tablet Take 50 mg by mouth daily. 09/13/19   [provider]  oxyCODONE -acetaminophen  (PERCOCET/ROXICET) 5-325 MG tablet Take 1 tablet by mouth every 4 (four) hours as needed. 08/28/24   Triplett, Tammy, PA-C  Potassium Citrate 99 MG CAPS Take 1 capsule by mouth daily. Patient not taking: Reported on 11/23/2022    [provider]  pyridostigmine (MESTINON) 60 MG tablet Take 60 mg by mouth 3 (three)  times daily. Patient not taking: Reported on 11/23/2022    [provider]  pyridOXINE (VITAMIN B-6) 100 MG tablet Take 100 mg by mouth daily.    [provider]  traZODone  (DESYREL ) 150 MG tablet Take 150 mg by mouth at bedtime. 10/30/15   [provider]  vitamin B-12 (CYANOCOBALAMIN ) 1000 MCG tablet Take 2,500 mcg by mouth daily.    [provider]  Vitamins-Lipotropics (EAR HEALTH PLUS PO) Take 1 tablet by mouth daily.    [provider]    Allergies: Bee venom    Review of Systems  Updated Vital Signs BP (!) 187/120 (BP Location: Right Arm)   Pulse 84   Temp 97.7 F (36.5 C) (Oral)   Resp 18   Wt 81.6 kg   SpO2 98%   BMI 27.37 kg/m   Physical Exam Vitals and nursing note reviewed.  Constitutional:      General: She is not in acute distress.    Appearance: She is well-developed.  HENT:     Head: Normocephalic and atraumatic.     Mouth/Throat:     Mouth: Mucous membranes are moist.  Eyes:     Conjunctiva/sclera: Conjunctivae normal.  Cardiovascular:     Rate and Rhythm: Normal rate and regular rhythm.     Heart sounds: No murmur heard. Pulmonary:     Effort: Pulmonary effort is normal. No respiratory distress.     Breath sounds: Normal breath sounds.  Abdominal:     Palpations: Abdomen is soft.     Tenderness: There is no right CVA tenderness, left CVA tenderness or guarding.     Comments: Mild suprapubic tenderness  Musculoskeletal:        General: No swelling.     Cervical back: Neck supple.  Skin:    General: Skin is warm and dry.     Capillary Refill: Capillary refill takes less than 2 seconds.  Neurological:     General: No focal deficit present.     Mental Status: She is alert and oriented to person, place, and time.  Psychiatric:        Mood and Affect: Mood normal.     (all labs ordered are listed, but only abnormal results are displayed) Labs Reviewed - No data to display  EKG: None  Radiology: DG  Shoulder Right Result Date: 08/28/2024 CLINICAL DATA:  Right shoulder pain. EXAM: RIGHT SHOULDER - 2+ VIEW COMPARISON:  None Available. FINDINGS: No acute fracture or dislocation. The bones are osteopenic. Mild arthritic changes of the right shoulder and right AC joint. The soft tissues are unremarkable. IMPRESSION: 1. No acute fracture or dislocation. 2. Mild arthritic changes. Electronically Signed   By: Vanetta Chou M.D.   On: 08/28/2024 17:51     Procedures   Medications Ordered in the ED - No data to display  Medical Decision Making Differential diagnosis includes but not limited to UTI, pyelonephritis, sepsis, other  ED course: Patient had outpatient labs 8 days ago, urine culture grew out ESBL Klebsiella.  Patient resistant to nitrofurantoin so we will need IV antibiotics.  Husband reports she has been urinating very frequently more so than usual, she has some mild suprapubic tenderness on exam but no other pain, labs reassuring.  Discussed with Dr. Pearlean who was consulted for admission.  Blood pressure here was elevated but patient is asymptomatic, given her home dose of amlodipine .  No chest pain, no shortness of breath, no headache, no dizziness, no numbness tingling or weakness.  No mental status change.  Amount and/or Complexity of Data Reviewed External Data Reviewed: labs and notes. Labs: ordered.  Risk Prescription drug management. Decision regarding hospitalization.        Final diagnoses:  None    ED Discharge Orders     None          Suellen Sherran DELENA DEVONNA 08/29/24 1834    Yolande Lamar BROCKS, MD 08/31/24 0020

## 2024-08-30 ENCOUNTER — Inpatient Hospital Stay (HOSPITAL_COMMUNITY)

## 2024-08-30 DIAGNOSIS — N390 Urinary tract infection, site not specified: Secondary | ICD-10-CM | POA: Diagnosis not present

## 2024-08-30 LAB — BASIC METABOLIC PANEL WITH GFR
Anion gap: 9 (ref 5–15)
BUN: 12 mg/dL (ref 8–23)
CO2: 26 mmol/L (ref 22–32)
Calcium: 8.5 mg/dL — ABNORMAL LOW (ref 8.9–10.3)
Chloride: 106 mmol/L (ref 98–111)
Creatinine, Ser: 0.87 mg/dL (ref 0.44–1.00)
GFR, Estimated: >60 mL/min (ref >60)
Glucose, Bld: 122 mg/dL — ABNORMAL HIGH (ref 70–99)
Potassium: 3.6 mmol/L (ref 3.5–5.1)
Sodium: 141 mmol/L (ref 135–145)

## 2024-08-30 LAB — CBC
HCT: 38.2 % (ref 36.0–46.0)
Hemoglobin: 12.3 g/dL (ref 12.0–15.0)
MCH: 29 pg (ref 26.0–34.0)
MCHC: 32.2 g/dL (ref 30.0–36.0)
MCV: 90.1 fL (ref 80.0–100.0)
Platelets: 214 K/uL (ref 150–400)
RBC: 4.24 MIL/uL (ref 3.87–5.11)
RDW: 13.4 % (ref 11.5–15.5)
WBC: 7.7 K/uL (ref 4.0–10.5)
nRBC: 0 % (ref 0.0–0.2)

## 2024-08-30 MED ORDER — LISINOPRIL 10 MG PO TABS
40.0000 mg | ORAL_TABLET | Freq: Every day | ORAL | Status: DC
Start: 2024-08-30 — End: 2024-09-04
  Administered 2024-08-30 – 2024-09-04 (×6): 40 mg via ORAL
  Filled 2024-08-30 (×6): qty 4

## 2024-08-30 MED ORDER — ERTAPENEM IV (FOR PTA / DISCHARGE USE ONLY): 1.0000 g | INTRAVENOUS | 0 refills | Status: DC

## 2024-08-30 MED ORDER — VITAMIN B-12 1000 MCG PO TABS
2500.0000 ug | ORAL_TABLET | Freq: Every day | ORAL | Status: DC
  Administered 2024-08-30 – 2024-09-04 (×6): 2500 ug via ORAL
  Filled 2024-08-30 (×6): qty 3

## 2024-08-30 MED ORDER — DONEPEZIL HCL 5 MG PO TABS
5.0000 mg | ORAL_TABLET | Freq: Every day | ORAL | Status: DC
  Administered 2024-08-30 – 2024-09-03 (×5): 5 mg via ORAL
  Filled 2024-08-30 (×5): qty 1

## 2024-08-30 MED ORDER — INFLUENZA VAC SPLIT HIGH-DOSE 0.5 ML IM SUSY
0.5000 mL | PREFILLED_SYRINGE | INTRAMUSCULAR | Status: AC
  Administered 2024-09-01: 0.5 mL via INTRAMUSCULAR
  Filled 2024-08-30: qty 0.5

## 2024-08-30 MED ORDER — SODIUM CHLORIDE 0.9% FLUSH: 10.0000 mL | INTRAVENOUS | Status: DC | PRN

## 2024-08-30 MED ORDER — ENSURE PLUS HIGH PROTEIN PO LIQD
237.0000 mL | Freq: Two times a day (BID) | ORAL | Status: DC
  Administered 2024-08-31 – 2024-09-02 (×4): 237 mL via ORAL

## 2024-08-30 MED ORDER — PYRIDOXINE HCL 25 MG PO TABS
100.0000 mg | ORAL_TABLET | Freq: Every day | ORAL | Status: DC
  Administered 2024-08-31 – 2024-09-04 (×5): 100 mg via ORAL
  Filled 2024-08-30 (×3): qty 4
  Filled 2024-08-30: qty 1
  Filled 2024-08-30 (×2): qty 4

## 2024-08-30 MED ORDER — SODIUM CHLORIDE 0.9% FLUSH
10.0000 mL | Freq: Two times a day (BID) | INTRAVENOUS | Status: DC
  Administered 2024-08-30 – 2024-09-04 (×11): 10 mL

## 2024-08-30 NOTE — Progress Notes (Signed)
 PHARMACY CONSULT NOTE FOR:  OUTPATIENT  PARENTERAL ANTIBIOTIC THERAPY (OPAT)  Indication: ESBL UTI  Regimen: Ertapenem 1 gm every 24 hours  End date: 09/04/24  IV antibiotic discharge orders are pended. To discharging provider:  please sign these orders via discharge navigator,  Select New Orders & click on the button choice - Manage This Unsigned Work.     Thank you for allowing pharmacy to be a part of this patient's care.  Damien Quiet, PharmD, BCPS, BCIDP Infectious Diseases Clinical Pharmacist Phone: [phone] 08/30/2024, 12:21 PM

## 2024-08-30 NOTE — Consult Note (Signed)
 [facility]                                                                                        Patient Identification: Patient Name: [name] MRN: [id number] Admit Date: 08/29/2024  4:06 PM Today's Date: 08/30/2024 Reason for consult: ESBL E. coli UTI Requesting provider: Dr. Mcarthur  Principal Problem:   Complicated UTI (urinary tract infection)   Antibiotics:  Ertapenem 10/30-  Lines/Hardware: Left arm midline  Assessment # UTI with left flank pain, possible pyelonephritis with ESBL Klebsiella pneumonia  Recommendations  - IV ertapenem for 7 days unless any new concerns in CT abdomen/pelvis pending - Maintain contact precautions ID will so, recall back with questions or concerns   OPAT  Diagnosis: Complicated UTI   Culture Result: ESBL Klebsiella pneumonia  Allergies  Allergen Reactions   Bee Venom Anaphylaxis    OPAT Orders Discharge antibiotics to be given via PICC line Discharge antibiotics: Ertapenem 1 g IV daily Per pharmacy protocol  Duration: 7 days End Date: 09/04/2024  [facility] Care Per Protocol:  Home health RN for IV administration and teaching; PICC line care and labs.    Labs weekly while on IV antibiotics: X__ CBC with differential X__ BMP __ CMP __ CRP __ ESR __ Vancomycin trough __ CK  X__ Please pull PIC at completion of IV antibiotics __ Please leave PIC in place until doctor has seen patient or been notified  Fax weekly labs to [phone]  Clinic Follow Up Appt: No need for fu   Rest of the management as per the primary team. Please call with questions or concerns.  Thank you for the consult  __________________________________________________________________________________________________________ HPI and Hospital Course: 78 year old female with prior history of Alzheimer's dementia, HTN, HLD, DM2, Depression, Kidney tumor who  presented to the ED on 10/30 after being called for urine culture growing ESBL E. coli and need for IV antibiotics.  Patient had urinary frequency, dysuria a week prior as well as suprapubic discomfort/left flank pain.  She had chronic right shoulder pain.  She was seen in the ED on 9/29 with unremarkable x-ray rt shoulder/    At ED afebrile Labs remarkable for UA positive for nitrite, small leukocytes and many bacteria, RBC 6-10, WBC 21-50 Started on IV ertapenem  08/21/24 urine culture greater than 100,000 CFU per mL ESBL Klebsiella pneumonia  ROS: General- Denies fever, chills, loss of appetite and loss of weight HEENT - Denies headache, blurry vision, neck pain, sinus pain Chest - Denies any chest pain, SOB or cough CVS- Denies any dizziness/lightheadedness, syncopal attacks, palpitations Abdomen- Denies any nausea, vomiting, abdominal pain, hematochezia and diarrhea Neuro - Denies any weakness, numbness, tingling sensation Psych - Denies any changes in mood irritability or depressive symptoms GU- Denies any burning, dysuria, hematuria or increased frequency of urination Skin - denies any rashes/lesions MSK - denies any joint pain/swelling or restricted ROM   Past Medical History:  Diagnosis Date   Alzheimer's dementia (HCC)    Anginal pain    Atypical chest pain    Depression    Hyperlipidemia    Hypertension  Kidney tumor    Obesity    Past Surgical History:  Procedure Laterality Date   ABDOMINAL HYSTERECTOMY     CARDIAC CATHETERIZATION  2009   normal coronary arteries, preserved LVEF 60%   CHOLECYSTECTOMY  2006   Dr. Kimble.   COLONOSCOPY  2004   Dr. Kristie: hemorrhoids, diverticulosis, some stool present and small lesions could have been missed. repeat colonoscopy in five years.    COLONOSCOPY  2016   Dr. Kristie: unremarkable per patient   EMBOLIZATION     HERNIA REPAIR     IR RADIOLOGIST EVAL & MGMT  12/31/2020   TONSILLECTOMY     Scheduled Meds:  amLODipine   10  mg Oral Daily   enoxaparin  (LOVENOX ) injection  40 mg Subcutaneous Q24H   feeding supplement  237 mL Oral BID BM   [START ON 08/31/2024] Influenza vac split trivalent PF  0.5 mL Intramuscular Tomorrow-1000   Continuous Infusions:  ertapenem     PRN Meds:.acetaminophen  **OR** acetaminophen , hydrALAZINE, ondansetron  **OR** ondansetron  (ZOFRAN ) IV, oxyCODONE -acetaminophen , polyethylene glycol  Allergies  Allergen Reactions   Bee Venom Anaphylaxis   Social History   Socioeconomic History   Marital status: Married    Spouse name: Caesar   Number of children: Not on file   Years of education: Not on file   Highest education level: Not on file  Occupational History   Occupation: retired  Tobacco Use   Smoking status: Never   Smokeless tobacco: Never  Vaping Use   Vaping status: Never Used  Substance and Sexual Activity   Alcohol use: No   Drug use: No   Sexual activity: Yes    Birth control/protection: Post-menopausal, Surgical  Other Topics Concern   Not on file  Social History Narrative   Pt lives in 1 story home with husband   Has 2 adult children   High school diploma - a few hobby college courses   Ret CSR from 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    Social Drivers of Corporate Investment Banker Strain: Low Risk  (02/27/2023)   Overall Financial Resource Strain (CARDIA)    Difficulty of Paying Living Expenses: Not very hard  Food Insecurity: No Food Insecurity (08/29/2024)   Hunger Vital Sign    Worried About Running Out of Food in the Last Year: Never true    Ran Out of Food in the Last Year: Never true  Transportation Needs: No Transportation Needs (08/29/2024)   PRAPARE - Administrator, Civil Service (Medical): No    Lack of Transportation (Non-Medical): No  Physical Activity: Inactive (04/24/2023)   Exercise Vital Sign    Days of Exercise per Week: 0 days    Minutes of Exercise per Session: 0 min  Stress: Stress Concern Present  (02/25/2022)   [employer] of Occupational Health - Occupational Stress Questionnaire    Feeling of Stress : Very much  Social Connections: Moderately Integrated (08/29/2024)   Social Connection and Isolation Panel    Frequency of Communication with Friends and Family: Three times a week    Frequency of Social Gatherings with Friends and Family: Three times a week    Attends Religious Services: 1 to 4 times per year    Active Member of Clubs or Organizations: No    Attends Banker Meetings: Never    Marital Status: Married  Catering Manager Violence: Not At Risk (08/29/2024)   Humiliation, Afraid, Rape, and Kick questionnaire  Fear of Current or Ex-Partner: No    Emotionally Abused: No    Physically Abused: No    Sexually Abused: No   Family History  Problem Relation Age of Onset   Diabetes Mother    Hypertension Mother    Colon cancer Brother        deceased age 75   Stroke Father    Cancer Sister        breast   Breast cancer Sister    Other Son        chron's    Breast cancer Maternal Grandmother    Vitals BP (!) 182/81 (BP Location: Right Arm)   Pulse 76   Temp 98.1 F (36.7 C)   Resp 17   Wt 77.7 kg   SpO2 98%   BMI 26.05 kg/m   Exam-  Sitting in the bed and comfortable   Pertinent Microbiology Results for orders placed or performed during the hospital encounter of 07/23/24  Urine Culture     Status: None   Collection Time: 07/23/24  1:41 PM   Specimen: Urine, Clean Catch  Result Value Ref Range Status   Specimen Description   Final    URINE, CLEAN CATCH Performed at [facility], [address], [place], [place] [address]    Special Requests   Final    NONE Performed at [facility], [address], [place], [place] [address]    Culture   Final    NO GROWTH Performed at [facility], [address] [place], [place] [address]    Report Status 07/24/2024 FINAL  Final   Pertinent Lab seen by me:    Latest Ref Rng & Units  08/30/2024    4:16 AM 08/29/2024    5:09 PM 08/03/2024   11:56 AM  CBC  WBC 4.0 - 10.5 K/uL 7.7  7.2  8.0   Hemoglobin 12.0 - 15.0 g/dL 87.6  86.7  86.6   Hematocrit 36.0 - 46.0 % 38.2  41.1  40.1   Platelets 150 - 400 K/uL 214  228  212       Latest Ref Rng & Units 08/30/2024    4:16 AM 08/29/2024    5:09 PM 08/03/2024   11:56 AM  CMP  Glucose 70 - 99 mg/dL 877  892  827   BUN 8 - 23 mg/dL 12  13  12    Creatinine 0.44 - 1.00 mg/dL 9.12  9.03  9.00   Sodium 135 - 145 mmol/L 141  142  140   Potassium 3.5 - 5.1 mmol/L 3.6  4.4  4.0   Chloride 98 - 111 mmol/L 106  107  106   CO2 22 - 32 mmol/L 26  28  22    Calcium  8.9 - 10.3 mg/dL 8.5  9.1  9.3   Total Protein 6.5 - 8.1 g/dL   6.1   Total Bilirubin 0.0 - 1.2 mg/dL   0.9   Alkaline Phos 38 - 126 U/L   91   AST 15 - 41 U/L   14   ALT 0 - 44 U/L   9      Pertinent Imagings/Other Imagings Plain films and CT images have been personally visualized and interpreted; radiology reports have been reviewed. Decision making incorporated into the Impression / Recommendations.  DG Shoulder Right Result Date: 08/28/2024 CLINICAL DATA:  Right shoulder pain. EXAM: RIGHT SHOULDER - 2+ VIEW COMPARISON:  None Available. FINDINGS: No acute fracture or dislocation. The bones  are osteopenic. Mild arthritic changes of the right shoulder and right AC joint. The soft tissues are unremarkable. IMPRESSION: 1. No acute fracture or dislocation. 2. Mild arthritic changes. Electronically Signed   By: Vanetta Chou M.D.   On: 08/28/2024 17:51    I spent 80 minutes involved in face-to-face and non-face-to-face activities for this patient on the day of the visit. Professional time spent includes the following activities: Preparing to see the patient (review of tests), Obtaining and reviewing separately obtained history (ED note, H&P, hospitalist progress note), Performing a medically appropriate examination and evaluation, Ordering medications/labs, referring and  communicating with other health care professionals, Documenting clinical information in the EMR, Independently interpreting results (not separately reported),and Care coordination (not separately reported).  Electronically signed by:   Plan d/w requesting provider as well as ID pharm D  Of note, portions of this note may have been created with voice recognition software. While this note has been edited for accuracy, occasional wrong-word or 'sound-a-like' substitutions may have occurred due to the inherent limitations of voice recognition software.   Annalee Orem, MD Infectious Disease Physician [facility] Pager: [phone]

## 2024-08-30 NOTE — TOC CM/SW Note (Signed)
 Transition of Care [facility]) CM/SW Note    Transition of Care [facility]) - Inpatient Brief Assessment   Patient Details  Name: FIORA WEILL MRN: [id number] Date of Birth: 11-08-1945  Transition of Care [facility]) CM/SW Contact:    Noreen KATHEE Cleotilde ISRAEL Phone Number: 08/30/2024, 10:39 AM   Clinical Narrative:  CSW spoke with spouse. Spouse shared that he watches patient often since she is a wander and is supportive. Spouse reports that he still lets patient drive , but is always with her . He states that patient uses a walker and cane. CSW did inform spouse about the home IV and reached out to [facility] with [facility]  speciality infusion services about patient needs. Pam will review chart to follow patient.  CSW will continue to follow.    Transition of Care Asessment: Insurance and Status: Insurance coverage has been reviewed Patient has primary care physician: Yes Home environment has been reviewed: Single Family Home Prior level of function:: Independent Prior/Current Home Services: No current home services Social Drivers of Health Review: SDOH reviewed no interventions necessary Readmission risk has been reviewed: Yes Transition of care needs: transition of care needs identified, TOC will continue to follow

## 2024-08-30 NOTE — Care Management Important Message (Signed)
 Important Message  Patient Details  Name: [name] MRN: [id number] Date of Birth: 20-Jan-1946   Important Message Given:  Yes - Medicare IM     [name] 08/30/2024, 4:24 PM

## 2024-08-30 NOTE — Progress Notes (Signed)
 Mobility Specialist Progress Note:    08/30/24 1002  Mobility  Activity Ambulated with assistance  Level of Assistance Modified independent, requires aide device or extra time  Assistive Device None  Distance Ambulated (ft) 15 ft  Range of Motion/Exercises Active;All extremities  Activity Response Tolerated well  Mobility Referral Yes  Mobility visit 1 Mobility  Mobility Specialist Start Time (ACUTE ONLY) 1002  Mobility Specialist Stop Time (ACUTE ONLY) 1022  Mobility Specialist Time Calculation (min) (ACUTE ONLY) 20 min   Pt received sitting EOB, requesting assistance to bathroom. ModI to stand and ambulate with no AD. Tolerated well,asx throughout. Returned supine, all needs met.  Makensey Rego Mobility Specialist Please contact via Special Educational Needs Teacher or  Rehab office at [phone]

## 2024-08-30 NOTE — Progress Notes (Signed)

## 2024-08-30 NOTE — Progress Notes (Signed)
 PROGRESS NOTE    [name]  FMW:[id number] DOB: 1946-03-08 DOA: 08/29/2024 PCP: Loreli Elyn SAILOR, MD   Brief Narrative:   [name] is a 78 y.o. female with medical history significant for Alzheimer's dementia, diabetes mellitus, hypertension. Patient was called to come to the ED after urine cultures grew multidrug-resistant bacteria. Patient reports ongoing urinary frequency and dysuria over the past week.  She reports right low back pain that has improved.  No history of kidney stones.  No vomiting no diarrhea.     Urine culture from Care Everywhere done 08/21/2024 grew > 100,000 colonies of Klebsiella pneumonia ESBL.  Patient was called by primary care provider office and sent to the ER for IV antibiotics.   Patient has been hemodynamically stable.  She denies any fever.  She does report of left flank pain.  Assessment & Plan:   Principal Problem:   Complicated UTI (urinary tract infection)   Complicated urinary tract infection-symptomatic with dysuria and urinary frequency, left flank pain as well.  -Urine culture from 08/21/2024 is positive for ESBL Klebsiella. Patient started on IV ertapenem in the ED, will continue.   - No systemic signs. -Will obtain CT of abdomen pelvis to rule out pyelonephritis/nephrolithiasis given persistent left flank pain. -Possibly can be discharged tomorrow with outpatient antibiotics. -Consult ID.   Right shoulder pain-no history of trauma.  No obvious deformity.  X-ray 10/29. With mild arthritic changes. - Acetaminophen  oxycodone  5/325 as needed   Diabetes mellitus-diet controlled.   Hypertension-elevated - IV hydralazine 5 mg for systolic > 180 - Resume Norvasc  10 mg daily - Resume lisinopril  daily.  Dementia -Resume donepezil, Cymbalta   DVT prophylaxis: Lovenox  Code Status: FULL code Family Communication: none at bedside Disposition Plan: ~ 2 days Consults called: None Admission status: inpt Med surg I certify that at the  point of admission it is my clinical judgment that the patient will require inpatient hospital care spanning beyond 2 midnights from the point of admission due to high intensity of service, high risk for further deterioration and high frequency of surveillance required.   Subjective:  Patient seen and examined at the bedside.  She reports of left flank pain.  No fever or chills.  Denies nausea or vomiting.  Vital signs are stable. Objective: Vitals:   08/29/24 2138 08/29/24 2324 08/30/24 0502 08/30/24 1234  BP:  (!) 136/54 (!) 132/52 (!) 182/81  Pulse:  94 70 76  Resp:  18 15 17   Temp:  98 F (36.7 C) 97.9 F (36.6 C) 98.1 F (36.7 C)  TempSrc:      SpO2: 97% 97% 95% 98%  Weight:        Intake/Output Summary (Last 24 hours) at 08/30/2024 1342 Last data filed at 08/30/2024 0830 Gross per 24 hour  Intake 880 ml  Output --  Net 880 ml   Filed Weights   08/29/24 1602 08/29/24 2100  Weight: 81.6 kg 77.7 kg    Examination:  General exam: Appears calm and comfortable  Respiratory system: Bilateral decreased breath sounds at bases Cardiovascular system: S1 & S2 heard, Rate controlled Gastrointestinal system: Abdomen is nondistended, soft and nontender. Normal bowel sounds heard.  Left flank tenderness. Extremities: No cyanosis, clubbing, edema  Central nervous system: Alert and oriented. No focal neurological deficits. Moving extremities Skin: No rashes, lesions or ulcers Psychiatry: Judgement and insight appear normal. Mood & affect appropriate.     Data Reviewed: I have personally reviewed following labs and imaging studies  CBC: Recent Labs  Lab 08/29/24 1709 08/30/24 0416  WBC 7.2 7.7  NEUTROABS 4.1  --   HGB 13.2 12.3  HCT 41.1 38.2  MCV 91.1 90.1  PLT 228 214   Basic Metabolic Panel: Recent Labs  Lab 08/29/24 1709 08/30/24 0416  NA 142 141  K 4.4 3.6  CL 107 106  CO2 28 26  GLUCOSE 107* 122*  BUN 13 12  CREATININE 0.96 0.87  CALCIUM  9.1 8.5*    GFR: Estimated Creatinine Clearance: 57.4 mL/min (by C-G formula based on SCr of 0.87 mg/dL). Liver Function Tests: No results for input(s): AST, ALT, ALKPHOS, BILITOT, PROT, ALBUMIN in the last 168 hours. No results for input(s): LIPASE, AMYLASE in the last 168 hours. No results for input(s): AMMONIA in the last 168 hours. Coagulation Profile: No results for input(s): INR, PROTIME in the last 168 hours. Cardiac Enzymes: No results for input(s): CKTOTAL, CKMB, CKMBINDEX, TROPONINI in the last 168 hours. BNP (last 3 results) No results for input(s): PROBNP in the last 8760 hours. HbA1C: No results for input(s): HGBA1C in the last 72 hours. CBG: No results for input(s): GLUCAP in the last 168 hours. Lipid Profile: No results for input(s): CHOL, HDL, LDLCALC, TRIG, CHOLHDL, LDLDIRECT in the last 72 hours. Thyroid  Function Tests: No results for input(s): TSH, T4TOTAL, FREET4, T3FREE, THYROIDAB in the last 72 hours. Anemia Panel: No results for input(s): VITAMINB12, FOLATE, FERRITIN, TIBC, IRON, RETICCTPCT in the last 72 hours. Sepsis Labs: No results for input(s): PROCALCITON, LATICACIDVEN in the last 168 hours.  No results found for this or any previous visit (from the past 240 hours).       Radiology Studies: DG Shoulder Right Result Date: 08/28/2024 CLINICAL DATA:  Right shoulder pain. EXAM: RIGHT SHOULDER - 2+ VIEW COMPARISON:  None Available. FINDINGS: No acute fracture or dislocation. The bones are osteopenic. Mild arthritic changes of the right shoulder and right AC joint. The soft tissues are unremarkable. IMPRESSION: 1. No acute fracture or dislocation. 2. Mild arthritic changes. Electronically Signed   By: Vanetta Chou M.D.   On: 08/28/2024 17:51        Scheduled Meds:  amLODipine   10 mg Oral Daily   enoxaparin  (LOVENOX ) injection  40 mg Subcutaneous Q24H   feeding supplement  237 mL  Oral BID BM   [START ON 08/31/2024] Influenza vac split trivalent PF  0.5 mL Intramuscular Tomorrow-1000   sodium chloride  flush  10-40 mL Intracatheter Q12H   Continuous Infusions:  ertapenem            Derryl Duval, MD [facility] 08/30/2024, 1:42 PM

## 2024-08-31 NOTE — Progress Notes (Signed)
 PROGRESS NOTE    [name]  FMW:[id number] DOB: [birth year] DOA: 08/29/2024 PCP: Loreli Elyn SAILOR, MD   Brief Narrative:   [name] is a 78 y.o. female with medical history significant for Alzheimer's dementia, diabetes mellitus, hypertension. Patient was called to come to the ED after urine cultures grew multidrug-resistant bacteria. Patient reports ongoing urinary frequency and dysuria over the past week.  She reports right low back pain that has improved.  No history of kidney stones.  No vomiting no diarrhea.     Urine culture from [facility] done 08/21/2024 grew > 100,000 colonies of Klebsiella pneumonia ESBL.  Patient was called by primary care provider office and sent to the ER for IV antibiotics.   Patient has been hemodynamically stable.  She denies any fever.  She does report of left flank pain.  Assessment & Plan:   Principal Problem:   Complicated UTI (urinary tract infection)   Complicated urinary tract infection-symptomatic with dysuria and urinary frequency, left flank pain as well.  -Urine culture from 08/21/2024 is positive for ESBL Klebsiella.  -CT renal with no acute findings.  Shows known right adrenal and left renal lesion which is chronic - Evaluated by ID.  Plan for 7 days of IV ertapenem.  Cannot be safely done at home, plan to discharge to rehab for IV antibiotics.   Right shoulder pain-no history of trauma.  No obvious deformity.  X-ray 10/29. With mild arthritic changes. - Acetaminophen  oxycodone  5/325 as needed   Diabetes mellitus-diet controlled.   Hypertension-elevated - IV hydralazine 5 mg for systolic > 180 - Resume Norvasc  10 mg daily - Resume lisinopril  daily.  Dementia -Resume donepezil, Cymbalta   DVT prophylaxis: Lovenox  Code Status: FULL code Family Communication: none at bedside Disposition Plan: ~ 2 days Consults called: None Admission status: inpt Med surg I certify that at the point of admission it is my clinical  judgment that the patient will require inpatient hospital care spanning beyond 2 midnights from the point of admission due to high intensity of service, high risk for further deterioration and high frequency of surveillance required.   Subjective:  Patient seen and examined at the bedside.  denies any pain today.  No fever or chills.  Denies nausea or vomiting.  Vital signs are stable.  She was not sure if she could do antibiotics at home Objective: Vitals:   08/30/24 2028 08/31/24 0543 08/31/24 0832 08/31/24 1300  BP: (!) 142/69 (!) 176/52 (!) 155/80 (!) 123/53  Pulse: 69 77  81  Resp: 18 16  17   Temp: 97.9 F (36.6 C) 97.6 F (36.4 C)  97.8 F (36.6 C)  TempSrc:  Oral    SpO2: 98% 99%  98%  Weight:      Height:        Intake/Output Summary (Last 24 hours) at 08/31/2024 1400 Last data filed at 08/31/2024 0830 Gross per 24 hour  Intake 840 ml  Output --  Net 840 ml   Filed Weights   08/29/24 1602 08/29/24 2100 08/30/24 1815  Weight: 81.6 kg 77.7 kg 77.7 kg    Examination:  General exam: Appears calm and comfortable  Respiratory system: Bilateral decreased breath sounds at bases Cardiovascular system: S1 & S2 heard, Rate controlled Gastrointestinal system: Abdomen is nondistended, soft and nontender. Normal bowel sounds heard.  Left flank tenderness. Extremities: No cyanosis, clubbing, edema  Central nervous system: Alert and oriented. No focal neurological deficits. Moving extremities Skin: No rashes, lesions or ulcers  Psychiatry: Judgement and insight appear normal. Mood & affect appropriate.     Data Reviewed: I have personally reviewed following labs and imaging studies  CBC: Recent Labs  Lab 08/29/24 1709 08/30/24 0416  WBC 7.2 7.7  NEUTROABS 4.1  --   HGB 13.2 12.3  HCT 41.1 38.2  MCV 91.1 90.1  PLT 228 214   Basic Metabolic Panel: Recent Labs  Lab 08/29/24 1709 08/30/24 0416  NA 142 141  K 4.4 3.6  CL 107 106  CO2 28 26  GLUCOSE 107* 122*   BUN 13 12  CREATININE 0.96 0.87  CALCIUM  9.1 8.5*   GFR: Estimated Creatinine Clearance: 57.4 mL/min (by C-G formula based on SCr of 0.87 mg/dL). Liver Function Tests: No results for input(s): AST, ALT, ALKPHOS, BILITOT, PROT, ALBUMIN in the last 168 hours. No results for input(s): LIPASE, AMYLASE in the last 168 hours. No results for input(s): AMMONIA in the last 168 hours. Coagulation Profile: No results for input(s): INR, PROTIME in the last 168 hours. Cardiac Enzymes: No results for input(s): CKTOTAL, CKMB, CKMBINDEX, TROPONINI in the last 168 hours. BNP (last 3 results) No results for input(s): PROBNP in the last 8760 hours. HbA1C: No results for input(s): HGBA1C in the last 72 hours. CBG: No results for input(s): GLUCAP in the last 168 hours. Lipid Profile: No results for input(s): CHOL, HDL, LDLCALC, TRIG, CHOLHDL, LDLDIRECT in the last 72 hours. Thyroid  Function Tests: No results for input(s): TSH, T4TOTAL, FREET4, T3FREE, THYROIDAB in the last 72 hours. Anemia Panel: No results for input(s): VITAMINB12, FOLATE, FERRITIN, TIBC, IRON, RETICCTPCT in the last 72 hours. Sepsis Labs: No results for input(s): PROCALCITON, LATICACIDVEN in the last 168 hours.  No results found for this or any previous visit (from the past 240 hours).       Radiology Studies: CT ABDOMEN PELVIS WO CONTRAST Result Date: 08/30/2024 CLINICAL DATA:  Flank pain EXAM: CT ABDOMEN AND PELVIS WITHOUT CONTRAST TECHNIQUE: Multidetector CT imaging of the abdomen and pelvis was performed following the standard protocol without IV contrast. RADIATION DOSE REDUCTION: This exam was performed according to the departmental dose-optimization program which includes automated exposure control, adjustment of the mA and/or kV according to patient size and/or use of iterative reconstruction technique. COMPARISON:  Comparison is made with CT  02/08/2021, 05/22/2019 FINDINGS: Lower chest: Scarring or atelectasis in the lower lobes. Small hiatal hernia Hepatobiliary: No focal liver abnormality is seen. Status post cholecystectomy. No biliary dilatation. Pancreas: Unremarkable. No pancreatic ductal dilatation or surrounding inflammatory changes. Spleen: Normal in size without focal abnormality. Adrenals/Urinary Tract: Left adrenal gland is normal. 2.5 cm right adrenal mass, stable and consistent with adenoma. No hydronephrosis. 3 mm nonobstructing stone in the lower pole of the right kidney. Cortical scarring superior pole left kidney with stable rim calcified complex lesion at the superior pole measuring 2.3 cm, previously 2.3 cm. Metallic densities consistent with embolization coils. Stable small fat focus density at the superior pole left kidney. The bladder is unremarkable Stomach/Bowel: The stomach is nonenlarged. There is no dilated small bowel. Diverticular disease of the colon without acute wall thickening Vascular/Lymphatic: Aortic atherosclerosis. No enlarged abdominal or pelvic lymph nodes. Reproductive: Hysterectomy.  No adnexal mass Other: Negative for ascites or free air Musculoskeletal: L5 compression deformity is chronic. Hemangioma at L2. IMPRESSION: 1. No CT evidence for acute intra-abdominal or pelvic abnormality. 2. Nonobstructing right kidney stone. Chronic changes superior pole left kidney. 3. Diverticular disease of the colon without acute wall thickening. 4. Aortic atherosclerosis.  Aortic Atherosclerosis (ICD10-I70.0). Electronically Signed   By: Luke Bun M.D.   On: 08/30/2024 19:34        Scheduled Meds:  amLODipine   10 mg Oral Daily   cyanocobalamin   2,500 mcg Oral Daily   donepezil  5 mg Oral QHS   enoxaparin  (LOVENOX ) injection  40 mg Subcutaneous Q24H   feeding supplement  237 mL Oral BID BM   Influenza vac split trivalent PF  0.5 mL Intramuscular Tomorrow-1000   lisinopril   40 mg Oral Daily   pyridOXINE  100  mg Oral Daily   sodium chloride  flush  10-40 mL Intracatheter Q12H   Continuous Infusions:  ertapenem 1 g (08/30/24 1739)          Derryl Duval, MD [facility] 08/31/2024, 2:00 PM

## 2024-08-31 NOTE — Plan of Care (Signed)

## 2024-08-31 NOTE — Discharge Summary (Incomplete)
 Physician Discharge Summary  [name] FMW:[id number] DOB: 1945-12-31 DOA: 08/29/2024  PCP: Loreli Elyn SAILOR, MD  Admit date: 08/29/2024 Discharge date: 08/31/2024  Admitted From: Home Disposition: Home  Recommendations for Outpatient Follow-up:  Follow up with PCP in 1 week with repeat CBC/BMP Follow up in ED if symptoms worsen or new appear   Home Health: No Equipment/Devices: None  Discharge Condition: Stable CODE STATUS: Full Diet recommendation: Heart healthy  Brief/Interim Summary: You may copy/paste interim summary or write brief hospital course depending on length of stay  Discharge Diagnoses:  Principal Problem:   Complicated UTI (urinary tract infection)    Discharge Instructions  Discharge Instructions     [facility] pharmacist to adjust dose for Vancomycin, Aminoglycosides and other anti-infective therapies as requested by physician.   Complete by: As directed    [facility] to provide Cath Flo 2mg    Complete by: As directed    Administer for PICC line occlusion and as ordered by physician for other access device issues.   Anaphylaxis Kit: Provided to treat any anaphylactic reaction to the medication being provided to the patient if First Dose or when requested by physician   Complete by: As directed    Epinephrine  1mg /ml vial / amp: Administer 0.3mg  (0.67ml) subcutaneously once for moderate to severe anaphylaxis, nurse to call physician and pharmacy when reaction occurs and call 911 if needed for immediate care   Diphenhydramine  50mg /ml IV vial: Administer 25-50mg  IV/IM PRN for first dose reaction, rash, itching, mild reaction, nurse to call physician and pharmacy when reaction occurs   Sodium Chloride  0.9% NS 500ml IV: Administer if needed for hypovolemic blood pressure drop or as ordered by physician after call to physician with anaphylactic reaction   Call MD for:  persistant nausea and vomiting   Complete by: As directed    Call  MD for:  severe uncontrolled pain   Complete by: As directed    Call MD for:  temperature >100.4   Complete by: As directed    Change dressing on IV access line weekly and PRN   Complete by: As directed    Diet - low sodium heart healthy   Complete by: As directed    Discharge instructions   Complete by: As directed    1.  Return to the ER if recurrence of symptoms 2.  Home and set up for infusion of antibiotics at home   Face-to-face encounter (required for Medicare/Medicaid patients)   Complete by: As directed    I Asherah Lavoy certify that this patient is under my care and that I, or a nurse practitioner or physician's assistant working with me, had a face-to-face encounter that meets the physician face-to-face encounter requirements with this patient on 08/30/2024. The encounter with the patient was in whole, or in part for the following medical condition(s) which is the primary reason for home health care (List medical condition): UTI   The encounter with the patient was in whole, or in part, for the following medical condition, which is the primary reason for home health care: UTI   I certify that, based on my findings, the following services are medically necessary home health services: Nursing   Reason for Medically Necessary Home Health Services: Skilled Nursing- Assessment and Training for Infusion Therapy, Line Care, and Infection Control   My clinical findings support the need for the above services: Cognitive impairments, dementia, or mental confusion  that make it unsafe to leave home   Further,  I certify that my clinical findings support that this patient is homebound due to: Unable to leave home safely without assistance   Flush IV access with Sodium Chloride  0.9% and Heparin 10 units/ml or 100 units/ml   Complete by: As directed    Home Health   Complete by: As directed    To provide the following care/treatments: RN   Home infusion instructions - [facility]    Complete by: As directed    Instructions: Flush IV access with Sodium Chloride  0.9% and Heparin 10units/ml or 100units/ml   Change dressing on IV access line: Weekly and PRN   Instructions Cath Flo 2mg : Administer for PICC Line occlusion and as ordered by physician for other access device   [facility] pharmacist to adjust dose for: Vancomycin, Aminoglycosides and other anti-infective therapies as requested by physician   Increase activity slowly   Complete by: As directed    Method of administration may be changed at the discretion of home infusion pharmacist based upon assessment of the patient and/or caregiver's ability to self-administer the medication ordered   Complete by: As directed    No wound care   Complete by: As directed       Allergies as of 08/31/2024       Reactions   Bee Venom Anaphylaxis        Medication List     TAKE these medications    Accu-Chek Aviva Plus test strip Generic drug: glucose blood 1 each by Other route as needed for other.   acyclovir  400 MG tablet Commonly known as: ZOVIRAX  Take 400 mg by mouth 2 (two) times daily.   atorvastatin  40 MG tablet Commonly known as: LIPITOR Take 40 mg by mouth at bedtime.   B-complex with vitamin C tablet Take 1 tablet by mouth daily.   Biotin 5 MG Caps Take 1 capsule by mouth daily.   cyanocobalamin  1000 MCG tablet Commonly known as: VITAMIN B12 Take 2,500 mcg by mouth daily.   docusate sodium  100 MG capsule Commonly known as: COLACE Take 100 mg by mouth 2 (two) times daily.   donepezil 5 MG tablet Commonly known as: ARICEPT Take 5 mg by mouth daily.   EAR HEALTH PLUS PO Take 1 tablet by mouth daily.   EPINEPHrine  0.3 mg/0.3 mL Soaj injection Commonly known as: EPI-PEN Inject 0.3 mg into the muscle as needed for anaphylaxis. Use as needed   ertapenem IVPB Commonly known as: INVANZ Inject 1 g into the vein daily for 5 days. Indication:  ESBL UTI  First Dose: Yes Last Day of  Therapy:  09/04/24 Labs - Once weekly:  CBC/D and BMP, Labs - Once weekly: ESR and CRP Method of administration: Mini-Bag Plus / Gravity Method of administration may be changed at the discretion of home infusion pharmacist based upon assessment of the patient and/or caregiver's ability to self-administer the medication ordered.   lisinopril  40 MG tablet Commonly known as: ZESTRIL  Take 40 mg by mouth daily.   Methyl Salicylate -Lido-Menthol  4-4-5 % Ptch Apply 1 patch topically in the morning and at bedtime.   oxyCODONE -acetaminophen  5-325 MG tablet Commonly known as: PERCOCET/ROXICET Take 1 tablet by mouth every 4 (four) hours as needed. What changed: reasons to take this   pantoprazole 40 MG tablet Commonly known as: PROTONIX Take 1 tablet by mouth 2 (two) times daily.   PRESERVISION AREDS 2 PO Take 2 tablets by mouth daily.   pyridOXINE 100 MG tablet Commonly known as: VITAMIN B6 Take  100 mg by mouth daily.   Vitamin D3 1.25 MG (50000 UT) Caps Take 10,000 Units by mouth daily at 6 (six) AM.               Discharge Care Instructions  (From admission, onward)           Start     Ordered   08/30/24 0000  Change dressing on IV access line weekly and PRN  (Home infusion instructions - [facility] )        08/30/24 1222            Contact information for follow-up providers     Loreli Elyn SAILOR, MD. Schedule an appointment as soon as possible for a visit in 1 week(s).   Specialty: Family Medicine Contact information: [address] [place] [address] [phone]              Contact information for after-discharge care     Home Medical Care     [facility]) .   Service: Home Health Services Contact information: [phone] [place] [address] [place]  [phone]                    Allergies  Allergen Reactions   Bee Venom Anaphylaxis     Consultations:    Procedures/Studies: CT ABDOMEN PELVIS WO CONTRAST Result Date: 08/30/2024 CLINICAL DATA:  Flank pain EXAM: CT ABDOMEN AND PELVIS WITHOUT CONTRAST TECHNIQUE: Multidetector CT imaging of the abdomen and pelvis was performed following the standard protocol without IV contrast. RADIATION DOSE REDUCTION: This exam was performed according to the departmental dose-optimization program which includes automated exposure control, adjustment of the mA and/or kV according to patient size and/or use of iterative reconstruction technique. COMPARISON:  Comparison is made with CT 02/08/2021, 05/22/2019 FINDINGS: Lower chest: Scarring or atelectasis in the lower lobes. Small hiatal hernia Hepatobiliary: No focal liver abnormality is seen. Status post cholecystectomy. No biliary dilatation. Pancreas: Unremarkable. No pancreatic ductal dilatation or surrounding inflammatory changes. Spleen: Normal in size without focal abnormality. Adrenals/Urinary Tract: Left adrenal gland is normal. 2.5 cm right adrenal mass, stable and consistent with adenoma. No hydronephrosis. 3 mm nonobstructing stone in the lower pole of the right kidney. Cortical scarring superior pole left kidney with stable rim calcified complex lesion at the superior pole measuring 2.3 cm, previously 2.3 cm. Metallic densities consistent with embolization coils. Stable small fat focus density at the superior pole left kidney. The bladder is unremarkable Stomach/Bowel: The stomach is nonenlarged. There is no dilated small bowel. Diverticular disease of the colon without acute wall thickening Vascular/Lymphatic: Aortic atherosclerosis. No enlarged abdominal or pelvic lymph nodes. Reproductive: Hysterectomy.  No adnexal mass Other: Negative for ascites or free air Musculoskeletal: L5 compression deformity is chronic. Hemangioma at L2. IMPRESSION: 1. No CT evidence for acute intra-abdominal or pelvic abnormality. 2. Nonobstructing right kidney  stone. Chronic changes superior pole left kidney. 3. Diverticular disease of the colon without acute wall thickening. 4. Aortic atherosclerosis. Aortic Atherosclerosis (ICD10-I70.0). Electronically Signed   By: Luke Bun M.D.   On: 08/30/2024 19:34   DG Shoulder Right Result Date: 08/28/2024 CLINICAL DATA:  Right shoulder pain. EXAM: RIGHT SHOULDER - 2+ VIEW COMPARISON:  None Available. FINDINGS: No acute fracture or dislocation. The bones are osteopenic. Mild arthritic changes of the right shoulder and right AC joint. The soft tissues are unremarkable. IMPRESSION: 1. No acute fracture or dislocation. 2. Mild arthritic changes. Electronically  Signed   By: Arash  Radparvar M.D.   On: 08/28/2024 17:51   CT Lumbar Spine Wo Contrast Result Date: 08/03/2024 CLINICAL DATA:  78 year old post fall.  Pain. EXAM: CT LUMBAR SPINE WITHOUT CONTRAST TECHNIQUE: Multidetector CT imaging of the lumbar spine was performed without intravenous contrast administration. Multiplanar CT image reconstructions were also generated. RADIATION DOSE REDUCTION: This exam was performed according to the departmental dose-optimization program which includes automated exposure control, adjustment of the mA and/or kV according to patient size and/or use of iterative reconstruction technique. COMPARISON:  Reformats from abdominopelvic CT 02/08/2021, no interval spine imaging available FINDINGS: Segmentation: 5 lumbar type vertebrae. Alignment: Slight levo scoliotic curvature. Trace grade 1 anterolisthesis L4 on L5. Vertebrae: The bones are diffusely under mineralized. Chronic L5 superior endplate compression deformity without significant change from prior exam. There is no acute fracture. Benign vertebral body hemangioma within L2. Included sacrum is intact. Paraspinal and other soft tissues: No paraspinal hematoma. Aortic atherosclerosis. Probable surgical clips in the upper left kidney. Nonobstructing right renal stone. Exophytic partially  calcified lesion from the upper left kidney is partially included, but unchanged from prior exam where visualized. Disc levels: Mild for age degenerative disc disease and facet hypertrophy. No high-grade canal stenosis. IMPRESSION: 1. No acute fracture of the lumbar spine. 2. Chronic L5 superior endplate compression deformity. Aortic Atherosclerosis (ICD10-I70.0). Electronically Signed   By: Andrea Gasman M.D.   On: 08/03/2024 14:39   CT Hip Right Wo Contrast Result Date: 08/03/2024 CLINICAL DATA:  Hip trauma, fracture suspected. EXAM: CT OF THE RIGHT HIP WITHOUT CONTRAST TECHNIQUE: Multidetector CT imaging of the right hip was performed according to the standard protocol. Multiplanar CT image reconstructions were also generated. RADIATION DOSE REDUCTION: This exam was performed according to the departmental dose-optimization program which includes automated exposure control, adjustment of the mA and/or kV according to patient size and/or use of iterative reconstruction technique. COMPARISON:  08/03/2024. FINDINGS: Bones/Joint/Cartilage No acute fracture or dislocation is seen. Mild degenerative changes are present at the right hip. No joint effusion is seen. Ligaments Suboptimally assessed by CT. Muscles and Tendons Within normal limits. Soft tissues No acute abnormality is seen. There is sigmoid diverticulosis without diverticulitis. IMPRESSION: No acute fracture or dislocation. Electronically Signed   By: Leita Birmingham M.D.   On: 08/03/2024 14:34   DG Humerus Right Result Date: 08/03/2024 CLINICAL DATA:  Fall with pain. EXAM: RIGHT HUMERUS - 2+ VIEW COMPARISON:  None Available. FINDINGS: There is no evidence of fracture or other focal bone lesions. Soft tissues are unremarkable. IMPRESSION: Negative. Electronically Signed   By: Camellia Candle M.D.   On: 08/03/2024 12:30   DG Hip Unilat W or Wo Pelvis 2-3 Views Right Result Date: 08/03/2024 CLINICAL DATA:  Fall with diffuse pain. EXAM: DG HIP (WITH OR  WITHOUT PELVIS) 2-3V RIGHT COMPARISON:  None Available. FINDINGS: Bones are diffusely demineralized bony anatomy of the pelvis appears intact. SI joints and symphysis pubis are without acute findings. AP and cross-table lateral views of the right hip show no evidence for a right femoral neck fracture although the cross-table view is markedly limited by technique. IMPRESSION: 1. No acute bony findings. 2. No evidence for right femoral neck fracture. Electronically Signed   By: Camellia Candle M.D.   On: 08/03/2024 12:22      Subjective:   Discharge Exam: Vitals:   08/31/24 0543 08/31/24 0832  BP: (!) 176/52 (!) 155/80  Pulse: 77   Resp: 16   Temp: 97.6 F (  36.4 C)   SpO2: 99%     General: Pt is alert, awake, not in acute distress Cardiovascular: rate controlled, S1/S2 + Respiratory: bilateral decreased breath sounds at bases Abdominal: Soft, NT, ND, bowel sounds + Extremities: no edema, no cyanosis    The results of significant diagnostics from this hospitalization (including imaging, microbiology, ancillary and laboratory) are listed below for reference.     Microbiology: No results found for this or any previous visit (from the past 240 hours).   Labs: BNP (last 3 results) No results for input(s): BNP in the last 8760 hours. Basic Metabolic Panel: Recent Labs  Lab 08/29/24 1709 08/30/24 0416  NA 142 141  K 4.4 3.6  CL 107 106  CO2 28 26  GLUCOSE 107* 122*  BUN 13 12  CREATININE 0.96 0.87  CALCIUM  9.1 8.5*   Liver Function Tests: No results for input(s): AST, ALT, ALKPHOS, BILITOT, PROT, ALBUMIN in the last 168 hours. No results for input(s): LIPASE, AMYLASE in the last 168 hours. No results for input(s): AMMONIA in the last 168 hours. CBC: Recent Labs  Lab 08/29/24 1709 08/30/24 0416  WBC 7.2 7.7  NEUTROABS 4.1  --   HGB 13.2 12.3  HCT 41.1 38.2  MCV 91.1 90.1  PLT 228 214   Cardiac Enzymes: No results for input(s): CKTOTAL,  CKMB, CKMBINDEX, TROPONINI in the last 168 hours. BNP: Invalid input(s): POCBNP CBG: No results for input(s): GLUCAP in the last 168 hours. D-Dimer No results for input(s): DDIMER in the last 72 hours. Hgb A1c No results for input(s): HGBA1C in the last 72 hours. Lipid Profile No results for input(s): CHOL, HDL, LDLCALC, TRIG, CHOLHDL, LDLDIRECT in the last 72 hours. Thyroid  function studies No results for input(s): TSH, T4TOTAL, T3FREE, THYROIDAB in the last 72 hours.  Invalid input(s): FREET3 Anemia work up No results for input(s): VITAMINB12, FOLATE, FERRITIN, TIBC, IRON, RETICCTPCT in the last 72 hours. Urinalysis    Component Value Date/Time   COLORURINE YELLOW 08/29/2024 1620   APPEARANCEUR HAZY (A) 08/29/2024 1620   LABSPEC 1.019 08/29/2024 1620   PHURINE 6.0 08/29/2024 1620   GLUCOSEU NEGATIVE 08/29/2024 1620   HGBUR MODERATE (A) 08/29/2024 1620   BILIRUBINUR NEGATIVE 08/29/2024 1620   BILIRUBINUR negative 07/23/2024 1330   KETONESUR NEGATIVE 08/29/2024 1620   PROTEINUR NEGATIVE 08/29/2024 1620   UROBILINOGEN 0.2 (A) 07/23/2024 1330   NITRITE POSITIVE (A) 08/29/2024 1620   LEUKOCYTESUR SMALL (A) 08/29/2024 1620   Sepsis Labs Recent Labs  Lab 08/29/24 1709 08/30/24 0416  WBC 7.2 7.7   Microbiology No results found for this or any previous visit (from the past 240 hours).   Time coordinating discharge: 35 minutes  SIGNED:   Nayeliz Hipp, MD  [facility] 08/31/2024, 10:39 AM

## 2024-09-01 LAB — GLUCOSE, CAPILLARY: Glucose-Capillary: 128 mg/dL — ABNORMAL HIGH (ref 70–99)

## 2024-09-01 MED ORDER — DOCUSATE SODIUM 100 MG PO CAPS
100.0000 mg | ORAL_CAPSULE | Freq: Two times a day (BID) | ORAL | Status: DC
  Administered 2024-09-01 – 2024-09-04 (×6): 100 mg via ORAL
  Filled 2024-09-01 (×6): qty 1

## 2024-09-01 MED ORDER — VITAMIN D 25 MCG (1000 UNIT) PO TABS
10000.0000 [IU] | ORAL_TABLET | Freq: Every day | ORAL | Status: DC
  Administered 2024-09-02 – 2024-09-04 (×3): 10000 [IU] via ORAL
  Filled 2024-09-01 (×3): qty 10

## 2024-09-01 MED ORDER — B COMPLEX-C PO TABS
1.0000 | ORAL_TABLET | Freq: Every day | ORAL | Status: DC
  Administered 2024-09-01 – 2024-09-04 (×4): 1 via ORAL
  Filled 2024-09-01 (×4): qty 1

## 2024-09-01 NOTE — Plan of Care (Signed)
  Problem: Education: Goal: Knowledge of General Education information will improve Description: Including pain rating scale, medication(s)/side effects and non-pharmacologic comfort measures Outcome: Progressing   Problem: Health Behavior/Discharge Planning: Goal: Ability to manage health-related needs will improve Outcome: Progressing   Problem: Clinical Measurements: Goal: Ability to maintain clinical measurements within normal limits will improve Outcome: Progressing   Problem: Activity: Goal: Risk for activity intolerance will decrease Outcome: Progressing   Problem: Nutrition: Goal: Adequate nutrition will be maintained Outcome: Progressing   Problem: Pain Managment: Goal: General experience of comfort will improve and/or be controlled Outcome: Progressing   Problem: Safety: Goal: Ability to remain free from injury will improve Outcome: Progressing   Problem: Skin Integrity: Goal: Risk for impaired skin integrity will decrease Outcome: Progressing

## 2024-09-01 NOTE — Progress Notes (Signed)
 PROGRESS NOTE    [name]  FMW:[id number] DOB: 1945/12/04 DOA: 08/29/2024 PCP: Loreli Elyn SAILOR, MD   Brief Narrative:   [name] is a 78 y.o. female with medical history significant for Alzheimer's dementia, diabetes mellitus, hypertension. Patient was called to come to the ED after urine cultures grew multidrug-resistant bacteria. Patient reports ongoing urinary frequency and dysuria over the past week.  She reports right low back pain that has improved.  No history of kidney stones.  No vomiting no diarrhea.     Urine culture from [facility] done 08/21/2024 grew > 100,000 colonies of Klebsiella pneumonia ESBL.  Patient was called by primary care provider office and sent to the ER for IV antibiotics.   Patient has been hemodynamically stable.  She denies any fever.  She does report of left flank pain.  Assessment & Plan:   Principal Problem:   Complicated UTI (urinary tract infection)   Complicated urinary tract infection-symptomatic with dysuria and urinary frequency, left flank pain as well.  -Urine culture from 08/21/2024 is positive for ESBL Klebsiella.  -CT renal with no acute findings.  Shows known right adrenal and left renal lesion which is chronic - Evaluated by ID.  Plan for 7 days of IV ertapenem.  Cannot be safely done at home, patient will stay in the hospital until antibiotics completed per TOC.  Right shoulder pain-no history of trauma.  No obvious deformity.  X-ray 10/29. With mild arthritic changes. - Acetaminophen  oxycodone  5/325 as needed   Diabetes mellitus-diet controlled.   Hypertension-elevated - IV hydralazine 5 mg for systolic > 180 - Resume Norvasc  10 mg daily - Resume lisinopril  daily.  Dementia -Resume donepezil, Cymbalta   DVT prophylaxis: Lovenox  Code Status: FULL code Family Communication: none at bedside Disposition Plan: ~ 2 days Consults called: None Admission status: inpt Med surg I certify that at the point of admission  it is my clinical judgment that the patient will require inpatient hospital care spanning beyond 2 midnights from the point of admission due to high intensity of service, high risk for further deterioration and high frequency of surveillance required.   Subjective:  Patient seen and examined at the bedside.  denies any pain today.  No fever or chills.  Denies nausea or vomiting.  Vital signs are stable.   Objective: Vitals:   08/31/24 1300 08/31/24 2206 09/01/24 0438 09/01/24 0917  BP: (!) 123/53 (!) 158/73 (!) 180/71 (!) 184/72  Pulse: 81 73 71   Resp: 17 18 16    Temp: 97.8 F (36.6 C) 97.7 F (36.5 C) (!) 97.5 F (36.4 C)   TempSrc:  Oral Oral   SpO2: 98% 95% 95%   Weight:      Height:        Intake/Output Summary (Last 24 hours) at 09/01/2024 1043 Last data filed at 09/01/2024 0458 Gross per 24 hour  Intake 720 ml  Output --  Net 720 ml   Filed Weights   08/29/24 1602 08/29/24 2100 08/30/24 1815  Weight: 81.6 kg 77.7 kg 77.7 kg    Examination:  General exam: Appears calm and comfortable  Respiratory system: Bilateral decreased breath sounds at bases Cardiovascular system: S1 & S2 heard, Rate controlled Gastrointestinal system: Abdomen is nondistended, soft and nontender. Normal bowel sounds heard.  Left flank tenderness. Extremities: No cyanosis, clubbing, edema  Central nervous system: Alert and oriented. No focal neurological deficits. Moving extremities Skin: No rashes, lesions or ulcers Psychiatry: Judgement and insight appear normal. Mood &  affect appropriate.     Data Reviewed: I have personally reviewed following labs and imaging studies  CBC: Recent Labs  Lab 08/29/24 1709 08/30/24 0416  WBC 7.2 7.7  NEUTROABS 4.1  --   HGB 13.2 12.3  HCT 41.1 38.2  MCV 91.1 90.1  PLT 228 214   Basic Metabolic Panel: Recent Labs  Lab 08/29/24 1709 08/30/24 0416  NA 142 141  K 4.4 3.6  CL 107 106  CO2 28 26  GLUCOSE 107* 122*  BUN 13 12  CREATININE 0.96  0.87  CALCIUM  9.1 8.5*   GFR: Estimated Creatinine Clearance: 57.4 mL/min (by C-G formula based on SCr of 0.87 mg/dL). Liver Function Tests: No results for input(s): AST, ALT, ALKPHOS, BILITOT, PROT, ALBUMIN in the last 168 hours. No results for input(s): LIPASE, AMYLASE in the last 168 hours. No results for input(s): AMMONIA in the last 168 hours. Coagulation Profile: No results for input(s): INR, PROTIME in the last 168 hours. Cardiac Enzymes: No results for input(s): CKTOTAL, CKMB, CKMBINDEX, TROPONINI in the last 168 hours. BNP (last 3 results) No results for input(s): PROBNP in the last 8760 hours. HbA1C: No results for input(s): HGBA1C in the last 72 hours. CBG: Recent Labs  Lab 09/01/24 0708  GLUCAP 128*   Lipid Profile: No results for input(s): CHOL, HDL, LDLCALC, TRIG, CHOLHDL, LDLDIRECT in the last 72 hours. Thyroid  Function Tests: No results for input(s): TSH, T4TOTAL, FREET4, T3FREE, THYROIDAB in the last 72 hours. Anemia Panel: No results for input(s): VITAMINB12, FOLATE, FERRITIN, TIBC, IRON, RETICCTPCT in the last 72 hours. Sepsis Labs: No results for input(s): PROCALCITON, LATICACIDVEN in the last 168 hours.  No results found for this or any previous visit (from the past 240 hours).       Radiology Studies: CT ABDOMEN PELVIS WO CONTRAST Result Date: 08/30/2024 CLINICAL DATA:  Flank pain EXAM: CT ABDOMEN AND PELVIS WITHOUT CONTRAST TECHNIQUE: Multidetector CT imaging of the abdomen and pelvis was performed following the standard protocol without IV contrast. RADIATION DOSE REDUCTION: This exam was performed according to the departmental dose-optimization program which includes automated exposure control, adjustment of the mA and/or kV according to patient size and/or use of iterative reconstruction technique. COMPARISON:  Comparison is made with CT 02/08/2021, 05/22/2019 FINDINGS: Lower  chest: Scarring or atelectasis in the lower lobes. Small hiatal hernia Hepatobiliary: No focal liver abnormality is seen. Status post cholecystectomy. No biliary dilatation. Pancreas: Unremarkable. No pancreatic ductal dilatation or surrounding inflammatory changes. Spleen: Normal in size without focal abnormality. Adrenals/Urinary Tract: Left adrenal gland is normal. 2.5 cm right adrenal mass, stable and consistent with adenoma. No hydronephrosis. 3 mm nonobstructing stone in the lower pole of the right kidney. Cortical scarring superior pole left kidney with stable rim calcified complex lesion at the superior pole measuring 2.3 cm, previously 2.3 cm. Metallic densities consistent with embolization coils. Stable small fat focus density at the superior pole left kidney. The bladder is unremarkable Stomach/Bowel: The stomach is nonenlarged. There is no dilated small bowel. Diverticular disease of the colon without acute wall thickening Vascular/Lymphatic: Aortic atherosclerosis. No enlarged abdominal or pelvic lymph nodes. Reproductive: Hysterectomy.  No adnexal mass Other: Negative for ascites or free air Musculoskeletal: L5 compression deformity is chronic. Hemangioma at L2. IMPRESSION: 1. No CT evidence for acute intra-abdominal or pelvic abnormality. 2. Nonobstructing right kidney stone. Chronic changes superior pole left kidney. 3. Diverticular disease of the colon without acute wall thickening. 4. Aortic atherosclerosis. Aortic Atherosclerosis (ICD10-I70.0). Electronically Signed  By: Luke Bun M.D.   On: 08/30/2024 19:34        Scheduled Meds:  amLODipine   10 mg Oral Daily   cyanocobalamin   2,500 mcg Oral Daily   donepezil  5 mg Oral QHS   enoxaparin  (LOVENOX ) injection  40 mg Subcutaneous Q24H   feeding supplement  237 mL Oral BID BM   lisinopril   40 mg Oral Daily   pyridOXINE  100 mg Oral Daily   sodium chloride  flush  10-40 mL Intracatheter Q12H   Continuous Infusions:  ertapenem 1 g  (08/31/24 1803)          Landyn Buckalew, MD [facility] 09/01/2024, 10:43 AM

## 2024-09-02 MED ORDER — ENSURE PLUS HIGH PROTEIN PO LIQD
237.0000 mL | Freq: Three times a day (TID) | ORAL | Status: DC
  Administered 2024-09-02 – 2024-09-03 (×2): 237 mL via ORAL

## 2024-09-02 NOTE — Progress Notes (Signed)
 PROGRESS NOTE    [name]  FMW:[id number] DOB: May 20, 1946 DOA: 08/29/2024 PCP: Loreli Elyn SAILOR, MD   Brief Narrative:   [name] is a 78 y.o. female with medical history significant for Alzheimer's dementia, diabetes mellitus, hypertension. Patient was called to come to the ED after urine cultures grew multidrug-resistant bacteria. Patient reports ongoing urinary frequency and dysuria over the past week.  She reports right low back pain that has improved.  No history of kidney stones.  No vomiting no diarrhea.     Urine culture from [facility] done 08/21/2024 grew > 100,000 colonies of Klebsiella pneumonia ESBL.  Patient was called by primary care provider office and sent to the ER for IV antibiotics.   Patient has been hemodynamically stable.  She denies any fever.    Assessment & Plan:   Principal Problem:   Complicated UTI (urinary tract infection)   Complicated urinary tract infection-symptomatic with dysuria and urinary frequency, left flank pain as well.  -Urine culture from 08/21/2024 is positive for ESBL Klebsiella.  -CT renal with no acute findings.  Shows known right adrenal and left renal lesion which is chronic - Evaluated by ID.  Plan for 7 days of IV ertapenem.  Cannot be safely done at home, patient will stay in the hospital until antibiotics completed   Right shoulder pain-no history of trauma.  No obvious deformity.  X-ray 10/29. With mild arthritic changes. - Acetaminophen  oxycodone  5/325 as needed   Diabetes mellitus-diet controlled.   Hypertension-elevated - IV hydralazine 5 mg for systolic > 180 - Resume Norvasc  10 mg daily - Resume lisinopril  daily.  Dementia -Resume donepezil, Cymbalta   DVT prophylaxis: Lovenox  Code Status: FULL code Family Communication: none at bedside Disposition Plan: ~ 2 days Consults called: None Admission status: inpt Med surg I certify that at the point of admission it is my clinical judgment that the  patient will require inpatient hospital care spanning beyond 2 midnights from the point of admission due to high intensity of service, high risk for further deterioration and high frequency of surveillance required.   Subjective:  Patient seen and examined at the bedside.  denies any pain today.  No fever or chills.  Denies nausea or vomiting.  Vital signs are stable.   Objective: Vitals:   09/01/24 1253 09/01/24 1937 09/02/24 0844 09/02/24 1232  BP: (!) 118/58 (!) 152/69 (!) 189/72 (!) 151/71  Pulse: 78 74  84  Resp: 17 16  17   Temp: 98.4 F (36.9 C) 98 F (36.7 C)  98.3 F (36.8 C)  TempSrc:  Oral  Oral  SpO2: 96% 97%  98%  Weight:      Height:        Intake/Output Summary (Last 24 hours) at 09/02/2024 1252 Last data filed at 09/01/2024 1700 Gross per 24 hour  Intake 480 ml  Output --  Net 480 ml   Filed Weights   08/29/24 1602 08/29/24 2100 08/30/24 1815  Weight: 81.6 kg 77.7 kg 77.7 kg    Examination:  General exam: Appears calm and comfortable  Respiratory system: Bilateral decreased breath sounds at bases Cardiovascular system: S1 & S2 heard, Rate controlled Gastrointestinal system: Abdomen is nondistended, soft and nontender. Normal bowel sounds heard.  Left flank tenderness. Extremities: No cyanosis, clubbing, edema  Central nervous system: Alert and oriented. No focal neurological deficits. Moving extremities Skin: No rashes, lesions or ulcers Psychiatry: Judgement and insight appear normal. Mood & affect appropriate.     Data  Reviewed: I have personally reviewed following labs and imaging studies  CBC: Recent Labs  Lab 08/29/24 1709 08/30/24 0416  WBC 7.2 7.7  NEUTROABS 4.1  --   HGB 13.2 12.3  HCT 41.1 38.2  MCV 91.1 90.1  PLT 228 214   Basic Metabolic Panel: Recent Labs  Lab 08/29/24 1709 08/30/24 0416  NA 142 141  K 4.4 3.6  CL 107 106  CO2 28 26  GLUCOSE 107* 122*  BUN 13 12  CREATININE 0.96 0.87  CALCIUM  9.1 8.5*    GFR: Estimated Creatinine Clearance: 57.4 mL/min (by C-G formula based on SCr of 0.87 mg/dL). Liver Function Tests: No results for input(s): AST, ALT, ALKPHOS, BILITOT, PROT, ALBUMIN in the last 168 hours. No results for input(s): LIPASE, AMYLASE in the last 168 hours. No results for input(s): AMMONIA in the last 168 hours. Coagulation Profile: No results for input(s): INR, PROTIME in the last 168 hours. Cardiac Enzymes: No results for input(s): CKTOTAL, CKMB, CKMBINDEX, TROPONINI in the last 168 hours. BNP (last 3 results) No results for input(s): PROBNP in the last 8760 hours. HbA1C: No results for input(s): HGBA1C in the last 72 hours. CBG: Recent Labs  Lab 09/01/24 0708  GLUCAP 128*   Lipid Profile: No results for input(s): CHOL, HDL, LDLCALC, TRIG, CHOLHDL, LDLDIRECT in the last 72 hours. Thyroid  Function Tests: No results for input(s): TSH, T4TOTAL, FREET4, T3FREE, THYROIDAB in the last 72 hours. Anemia Panel: No results for input(s): VITAMINB12, FOLATE, FERRITIN, TIBC, IRON, RETICCTPCT in the last 72 hours. Sepsis Labs: No results for input(s): PROCALCITON, LATICACIDVEN in the last 168 hours.  No results found for this or any previous visit (from the past 240 hours).       Radiology Studies: No results found.       Scheduled Meds:  amLODipine   10 mg Oral Daily   B-complex with vitamin C  1 tablet Oral Daily   cholecalciferol  10,000 Units Oral Q0600   cyanocobalamin   2,500 mcg Oral Daily   docusate sodium   100 mg Oral BID   donepezil  5 mg Oral QHS   enoxaparin  (LOVENOX ) injection  40 mg Subcutaneous Q24H   feeding supplement  237 mL Oral BID BM   lisinopril   40 mg Oral Daily   pyridOXINE  100 mg Oral Daily   sodium chloride  flush  10-40 mL Intracatheter Q12H   Continuous Infusions:  ertapenem 1 g (09/02/24 1119)          Derryl Duval, MD [facility] 09/02/2024, 12:52 PM

## 2024-09-02 NOTE — Progress Notes (Signed)
 Mobility Specialist Progress Note:    09/02/24 1000  Mobility  Activity Ambulated with assistance;Stood at bedside;Dangled on edge of bed  Level of Assistance Standby assist, set-up cues, supervision of patient - no hands on  Assistive Device Front wheel walker  Distance Ambulated (ft) 6 ft  Range of Motion/Exercises Active;All extremities  Activity Response Tolerated well  Mobility Referral Yes  Mobility visit 1 Mobility  Mobility Specialist Start Time (ACUTE ONLY) 1000  Mobility Specialist Stop Time (ACUTE ONLY) 1020  Mobility Specialist Time Calculation (min) (ACUTE ONLY) 20 min   Pt received in bed, requesting to see husband and trying to leave room. Required SBA to stand and ambulate with RW. Tolerated well, confused/emotional. Let pt know I was going to bring husband to see her, returned sitting EOB. Alarm on, all needs met.  Sherrill Mckamie Mobility Specialist Please contact via Special Educational Needs Teacher or  Rehab office at [phone]

## 2024-09-02 NOTE — Plan of Care (Signed)
 Patient is only oriented to person and removed her midline. Impulsive and gets out of bed without calling for assistance. Pleasant demeanor and redirectable at this time.

## 2024-09-02 NOTE — Progress Notes (Signed)
 Called to pt's room by hospitalist who states that pt was pulling her left upper arm  mid-line dressing loose. On arrival, dressing completely loose, angiocath still partially in arm but not seated completely. Flushes easily but unable to pull back blood. Site removed and pressure dressing applied. MD and assigned nurse notified.

## 2024-09-02 NOTE — Progress Notes (Signed)
 Initial Nutrition Assessment  DOCUMENTATION CODES:   Non-severe (moderate) malnutrition in context of social or environmental circumstances  INTERVENTION:   Ensure Plus High Protein po TID, each supplement provides 350 kcal and 20 grams of protein  Magic cup TID with meals, each supplement provides 290 kcal and 9 grams of protein  Liberalize diet to regular  NUTRITION DIAGNOSIS:   Moderate Malnutrition related to social / environmental circumstances as evidenced by mild fat depletion, mild muscle depletion, moderate muscle depletion.  GOAL:   Patient will meet greater than or equal to 90% of their needs  MONITOR:   PO intake, Supplement acceptance  REASON FOR ASSESSMENT:   Malnutrition Screening Tool    ASSESSMENT:   78 yo female admitted with complicated UTI. PMH includes Alzheimer's dementia, HTN, HLD.  Patient reports that she has been eating well. She does not like the hospital food, but meal intakes are recorded at 50-100%. She is ordering mostly fruits and soup. She would benefit from diet liberalization to increase menu options. She thinks she has lost a little weight, but not much. Weight history reviewed. Patient has had 5% weight loss over the past month. She likes Ensure supplements and agreed to try magic cup supplements as well.   Labs reviewed. Medications reviewed and include B-complex with vitamin C, vitamin D, vitamin B12, colace, vitamin B6.  Patient meets criteria for moderate malnutrition, given mild-moderate depletion of muscle and subcutaneous fat mass.  NUTRITION - FOCUSED PHYSICAL EXAM:  Flowsheet Row Most Recent Value  Orbital Region Mild depletion  Upper Arm Region Mild depletion  Thoracic and Lumbar Region Mild depletion  Buccal Region Moderate depletion  Temple Region Moderate depletion  Clavicle Bone Region Mild depletion  Clavicle and Acromion Bone Region Mild depletion  Scapular Bone Region Mild depletion  Dorsal Hand Moderate  depletion  Patellar Region Mild depletion  Anterior Thigh Region Mild depletion  Posterior Calf Region Mild depletion  Edema (RD Assessment) None  Hair Other (Comment)  [alopecia]  Eyes Reviewed  Mouth Reviewed  Skin Reviewed  Nails Reviewed    Diet Order:   Diet Order             Diet - low sodium heart healthy           Diet heart healthy/carb modified Room service appropriate? Yes; Fluid consistency: Thin  Diet effective now                   EDUCATION NEEDS:   Not appropriate for education at this time  Skin:  Skin Assessment: Reviewed RN Assessment  Last BM:  11/2  Height:   Ht Readings from Last 1 Encounters:  08/30/24 5' 8 (1.727 m)    Weight:   Wt Readings from Last 1 Encounters:  08/30/24 77.7 kg    Ideal Body Weight:  63.6 kg  BMI:  Body mass index is 26.05 kg/m.  Estimated Nutritional Needs:   Kcal:  1600-1800  Protein:  80-90 gm  Fluid:  1.6-1.8 L   Suzen HUNT RD, LDN, CNSC Contact via secure chat. If unavailable, use group chat RD Inpatient.

## 2024-09-03 ENCOUNTER — Inpatient Hospital Stay (HOSPITAL_COMMUNITY)

## 2024-09-03 MED ORDER — SODIUM CHLORIDE 0.9 % IV SOLN
1.0000 g | Freq: Every day | INTRAVENOUS | Status: AC
  Administered 2024-09-03 – 2024-09-04 (×2): 1 g via INTRAVENOUS
  Filled 2024-09-03 (×2): qty 1000

## 2024-09-03 NOTE — Progress Notes (Signed)
 Patient has refused post fall vitals and x-ray of left hip and knee. Patient became verbally aggressive and combative and is noncompliant with fall precaution interventions.  Patient currently up to chair. Fall precaution bracelet, non skid socks, and fall mats on both sides of bed are all fall interventions in place.

## 2024-09-03 NOTE — Progress Notes (Signed)
   09/02/24 2314  What Happened  Was fall witnessed? No  Was patient injured? No  Patient found on floor  Found by Staff-comment (Nurse technician)  Stated prior activity other (comment) (patient stated that she thought she saw someone running and got scared and tried to hide from the person)  Provider Notification  Provider Name/Title Dr. Gilberto  Date Provider Notified 09/02/24  Time Provider Notified 2327  Method of Notification Page  Notification Reason Fall  Provider response See new orders  Date of Provider Response 09/03/24  Time of Provider Response 0009  Follow Up  Family notified Yes - comment (unable to reach patient's spouse)  Time family notified 2314  Additional tests Yes-comment (xray to left hip and left knee)  Adult Fall Risk Assessment  Risk Factor Category (scoring not indicated) High fall risk per protocol (document High fall risk)  Patient Fall Risk Level High fall risk  Adult Fall Risk Interventions  Required Bundle Interventions *See Row Information* High fall risk  Additional Interventions Use of appropriate toileting equipment (bedpan, BSC, etc.);Room near nurses station;PT/OT need assessed if change in mobility from baseline  Screening for Fall Injury Risk (To be completed on HIGH fall risk patients) - Assessing Need for Floor Mats  Risk For Fall Injury- Criteria for Floor Mats Noncompliant with safety precautions;Confusion/dementia (+NuDESC, CIWA, TBI, etc.)  Will Implement Floor Mats Yes  Vitals  Temp 97.7 F (36.5 C)  Temp Source Oral  BP (!) 167/87  MAP (mmHg) 105  BP Location Left Arm  BP Method Automatic  Patient Position (if appropriate) Lying  Pulse Rate (!) 103  Pulse Rate Source Dinamap  Resp 20  Oxygen  Therapy  SpO2 100 %  O2 Device Room Air  Pain Assessment  Pain Scale PAINAD  PAINAD (Pain Assessment in Advanced Dementia)  Breathing 0  Negative Vocalization 0  Facial Expression 0  Body Language 0  Consolability 0  PAINAD  Score 0  Neurological  Neuro (WDL) X  Level of Consciousness Alert  Orientation Level Oriented to person;Oriented to time;Oriented to situation;Disoriented to place  Cognition Poor attention/concentration;Poor safety awareness;Poor judgement;Impulsive  Speech Clear  R Pupil Size (mm) 2  R Pupil Shape Round  R Pupil Reaction Brisk  L Pupil Size (mm) 2  L Pupil Shape Round  L Pupil Reaction Brisk  Motor Function/Sensation Assessment Grip  R Hand Grip Moderate  L Hand Grip Moderate  Neuro Symptoms Forgetful;Agitation;Anxiety  Neuro symptoms relieved by Rest  Glasgow Coma Scale  Eye Opening 4  Best Verbal Response (NON-intubated) 4  Best Motor Response 6  Glasgow Coma Scale Score 14  Musculoskeletal  Musculoskeletal (WDL) X  Assistive Device Front wheel walker  Generalized Weakness Yes  Weight Bearing Restrictions Per Provider Order No  Integumentary  Integumentary (WDL) X  Skin Color Appropriate for ethnicity  Skin Condition Dry  Skin Integrity Abrasion;Ecchymosis  Abrasion Location Leg  Abrasion Location Orientation Right  Ecchymosis Location Arm  Ecchymosis Location Orientation Bilateral  Skin Turgor Non-tenting

## 2024-09-03 NOTE — Plan of Care (Signed)
   Problem: Nutrition: Goal: Adequate nutrition will be maintained Outcome: Progressing   Problem: Coping: Goal: Level of anxiety will decrease Outcome: Progressing   Problem: Safety: Goal: Ability to remain free from injury will improve Outcome: Progressing
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
## 2024-09-04 DIAGNOSIS — F03918 Unspecified dementia, unspecified severity, with other behavioral disturbance: Secondary | ICD-10-CM | POA: Diagnosis not present

## 2024-09-04 DIAGNOSIS — E440 Moderate protein-calorie malnutrition: Secondary | ICD-10-CM | POA: Insufficient documentation

## 2024-09-04 DIAGNOSIS — N390 Urinary tract infection, site not specified: Secondary | ICD-10-CM | POA: Diagnosis not present

## 2024-09-04 DIAGNOSIS — Z23 Encounter for immunization: Secondary | ICD-10-CM | POA: Diagnosis present

## 2024-09-04 MED ORDER — ENSURE PLUS HIGH PROTEIN PO LIQD: 237.0000 mL | Freq: Three times a day (TID) | ORAL | Status: AC

## 2024-09-04 NOTE — Plan of Care (Signed)
   Problem: Education: Goal: Knowledge of General Education information will improve Description Including pain rating scale, medication(s)/side effects and non-pharmacologic comfort measures Outcome: Progressing   Problem: Health Behavior/Discharge Planning: Goal: Ability to manage health-related needs will improve Outcome: Progressing

## 2024-09-04 NOTE — Progress Notes (Signed)
 Patient is being discharged home. Patients husband,Caesar, was called and discharge instructions reviewed with patients husband via telephone. Patient husband is her ride home.

## 2024-09-04 NOTE — Discharge Summary (Addendum)
 Physician Discharge Summary   Patient: [name] MRN: [id number] DOB: [birth year]  Admit date:     08/29/2024  Discharge date: 09/04/24  Discharge Physician: [name]   PCP: Carolyn Elyn SAILOR, MD   Recommendations at discharge:  Repeat basic metabolic panel to follow electrolytes and renal function Continue to follow patient's A1c/CBG fluctuation with future medications adjustment if needed. Reassess blood pressure and adjust antihypertensive treatment as needed. Recommending goals of care discussion/advance care planning given ongoing advancement in patient's dementia and overall physical decline.[name]  Discharge Diagnoses: Principal Problem:   Complicated UTI (urinary tract infection) Active Problems:   Malnutrition of moderate degree   Dementia with behavioral disturbance (HCC) Hypertension Type 2 diabetes   Brief Hospital narrative: [name] is a 78 y.o. female with medical history significant for Alzheimer's dementia, diabetes mellitus, hypertension. Patient was called to come to the ED after urine cultures grew multidrug-resistant bacteria. Patient reports ongoing urinary frequency and dysuria over the past week.  She reports right low back pain that has improved.  No history of kidney stones.  No vomiting no diarrhea.     Urine culture from [facility] Everywhere done 08/21/2024 grew > 100,000 colonies of Klebsiella pneumonia ESBL.  Patient was called by primary care provider office and sent to the ER for IV antibiotics.   Patient has been hemodynamically stable.  No fever, no nausea, no vomiting.    Assessment and Plan: Complicated urinary tract infection-symptomatic with dysuria and urinary frequency, left flank pain as well.   -Urine culture from 08/21/2024 is positive for ESBL Klebsiella.  -CT renal with no acute findings.  Shows known right adrenal and left renal lesion which is chronic - Evaluated by ID.  Plan for 7 days of IV ertapenem.  Cannot be safely done at  home, patient will stay in the hospital until antibiotics completed.  Last day of antibiotics 09/04/2024 -Patient advised to continue maintaining adequate hydration.   Right shoulder pain -no history of trauma.  No obvious deformity.  X-ray 10/29. With mild arthritic changes. - Continue as needed analgesics.   Right adrenal mass/left renal lesion -2.5 cm, is stable and consistent with adenoma.   -Continue with outpatient follow-up -Left renal lesion superior pole, stable.   Diabetes mellitus -diet controlled. -Continue to follow CBGs/A1c fluctuation.   Hypertension-elevated - Continue home antihypertensive agents - Heart healthy/low-sodium diet recommended. - Maintain adequate hydration - Reassess blood pressure and adjust medication as needed.   Dementia with behavioral disturbances -Resume donepezil, Cymbalta - Overall stable mood.  Moderate malnutrition - Continue feeding supplements - Maintain adequate hydration - Appreciate assistance and recommendation by clinical dietitian.  Consultants: None Procedures performed: See below for x-ray reports. Disposition: Home Diet recommendation: Heart healthy/low-sodium diet.  DISCHARGE MEDICATION: Allergies as of 09/04/2024       Reactions   Bee Venom Anaphylaxis        Medication List     TAKE these medications    Accu-Chek Aviva Plus test strip Generic drug: glucose blood 1 each by Other route as needed for other.   acyclovir  400 MG tablet Commonly known as: ZOVIRAX  Take 400 mg by mouth 2 (two) times daily.   atorvastatin  40 MG tablet Commonly known as: LIPITOR Take 40 mg by mouth at bedtime.   B-complex with vitamin C tablet Take 1 tablet by mouth daily.   Biotin 5 MG Caps Take 1 capsule by mouth daily.   cyanocobalamin  1000 MCG tablet Commonly known as: VITAMIN B12 Take  2,500 mcg by mouth daily.   docusate sodium  100 MG capsule Commonly known as: COLACE Take 100 mg by mouth 2 (two) times daily.    donepezil 5 MG tablet Commonly known as: ARICEPT Take 5 mg by mouth daily.   EAR HEALTH PLUS PO Take 1 tablet by mouth daily.   EPINEPHrine  0.3 mg/0.3 mL Soaj injection Commonly known as: EPI-PEN Inject 0.3 mg into the muscle as needed for anaphylaxis. Use as needed   feeding supplement Liqd Take 237 mLs by mouth 3 (three) times daily between meals.   lisinopril  40 MG tablet Commonly known as: ZESTRIL  Take 40 mg by mouth daily.   Methyl Salicylate -Lido-Menthol  4-4-5 % Ptch Apply 1 patch topically in the morning and at bedtime.   oxyCODONE -acetaminophen  5-325 MG tablet Commonly known as: PERCOCET/ROXICET Take 1 tablet by mouth every 4 (four) hours as needed. What changed: reasons to take this   pantoprazole 40 MG tablet Commonly known as: PROTONIX Take 1 tablet by mouth 2 (two) times daily.   PRESERVISION AREDS 2 PO Take 2 tablets by mouth daily.   pyridOXINE 100 MG tablet Commonly known as: VITAMIN B6 Take 100 mg by mouth daily.   Vitamin D3 1.25 MG (50000 UT) Caps Take 10,000 Units by mouth daily at 6 (six) AM.        Contact information for follow-up providers     Carolyn Elyn SAILOR, MD. Schedule an appointment as soon as possible for a visit in 10 day(s).   Specialty: Family Medicine Contact information: [address] [place] [address] [phone]              Contact information for after-discharge care     Home Medical Care     [facility]) .   Service: Home Health Services Contact information: [phone] [facility] [place]  [address] [phone]                    Discharge Exam: Filed Weights   08/29/24 1602 08/29/24 2100 08/30/24 1815  Weight: 81.6 kg 77.7 kg 77.7 kg   General exam: Alert, awake, afebrile and following commands appropriately. Respiratory system: Good saturation on room air. Cardiovascular system:RRR. No murmurs, rubs, gallops. Gastrointestinal system: Abdomen is  nondistended, soft and nontender. No organomegaly or masses felt. Normal bowel sounds heard. Central nervous system: No focal neurological deficits. Extremities: No cyanosis or clubbing. Skin: No petechiae. Psychiatry: Judgement and insight appear impaired secondary to underlying dementia; stable mood.   Condition at discharge: Stable and improved.  The results of significant diagnostics from this hospitalization (including imaging, microbiology, ancillary and laboratory) are listed below for reference.   Imaging Studies: CT ABDOMEN PELVIS WO CONTRAST Result Date: 08/30/2024 CLINICAL DATA:  Flank pain EXAM: CT ABDOMEN AND PELVIS WITHOUT CONTRAST TECHNIQUE: Multidetector CT imaging of the abdomen and pelvis was performed following the standard protocol without IV contrast. RADIATION DOSE REDUCTION: This exam was performed according to the departmental dose-optimization program which includes automated exposure control, adjustment of the mA and/or kV according to patient size and/or use of iterative reconstruction technique. COMPARISON:  Comparison is made with CT 02/08/2021, 05/22/2019 FINDINGS: Lower chest: Scarring or atelectasis in the lower lobes. Small hiatal hernia Hepatobiliary: No focal liver abnormality is seen. Status post cholecystectomy. No biliary dilatation. Pancreas: Unremarkable. No pancreatic ductal dilatation or surrounding inflammatory changes. Spleen: Normal in size without focal abnormality. Adrenals/Urinary Tract: Left adrenal gland is normal. 2.5 cm right adrenal mass, stable  and consistent with adenoma. No hydronephrosis. 3 mm nonobstructing stone in the lower pole of the right kidney. Cortical scarring superior pole left kidney with stable rim calcified complex lesion at the superior pole measuring 2.3 cm, previously 2.3 cm. Metallic densities consistent with embolization coils. Stable small fat focus density at the superior pole left kidney. The bladder is unremarkable  Stomach/Bowel: The stomach is nonenlarged. There is no dilated small bowel. Diverticular disease of the colon without acute wall thickening Vascular/Lymphatic: Aortic atherosclerosis. No enlarged abdominal or pelvic lymph nodes. Reproductive: Hysterectomy.  No adnexal mass Other: Negative for ascites or free air Musculoskeletal: L5 compression deformity is chronic. Hemangioma at L2. IMPRESSION: 1. No CT evidence for acute intra-abdominal or pelvic abnormality. 2. Nonobstructing right kidney stone. Chronic changes superior pole left kidney. 3. Diverticular disease of the colon without acute wall thickening. 4. Aortic atherosclerosis. Aortic Atherosclerosis (ICD10-I70.0). Electronically Signed   By: Luke Bun M.D.   On: 08/30/2024 19:34   DG Shoulder Right Result Date: 08/28/2024 CLINICAL DATA:  Right shoulder pain. EXAM: RIGHT SHOULDER - 2+ VIEW COMPARISON:  None Available. FINDINGS: No acute fracture or dislocation. The bones are osteopenic. Mild arthritic changes of the right shoulder and right AC joint. The soft tissues are unremarkable. IMPRESSION: 1. No acute fracture or dislocation. 2. Mild arthritic changes. Electronically Signed   By: Vanetta Chou M.D.   On: 08/28/2024 17:51    Microbiology: Results for orders placed or performed during the hospital encounter of 07/23/24  Urine Culture     Status: None   Collection Time: 07/23/24  1:41 PM   Specimen: Urine, Clean Catch  Result Value Ref Range Status   Specimen Description   Final    URINE, CLEAN CATCH Performed at [facility], [address] [place], [place] [address]    Special Requests   Final    NONE Performed at [facility], [address] [place], [place] [address]    Culture   Final    NO GROWTH Performed at [facility], [address] [place], [place] [address]    Report Status 07/24/2024 FINAL  Final    Labs: CBC: Recent Labs  Lab 08/29/24 1709 08/30/24 0416  WBC 7.2 7.7  NEUTROABS 4.1  --   HGB  13.2 12.3  HCT 41.1 38.2  MCV 91.1 90.1  PLT 228 214   Basic Metabolic Panel: Recent Labs  Lab 08/29/24 1709 08/30/24 0416  NA 142 141  K 4.4 3.6  CL 107 106  CO2 28 26  GLUCOSE 107* 122*  BUN 13 12  CREATININE 0.96 0.87  CALCIUM  9.1 8.5*    CBG: Recent Labs  Lab 09/01/24 0708  GLUCAP 128*    Discharge time spent:  35 minutes.  Signed: Eric Nunnery, MD [facility] 09/04/2024

## 2024-09-04 NOTE — Care Management Important Message (Signed)
 Important Message  Patient Details  Name: [name] MRN: [id number] Date of Birth: Jul 28, 1946   Important Message Given:  Yes - Medicare IM     Zainab Crumrine L Samiksha Pellicano 09/04/2024, 11:30 AM

## 2024-09-05 ENCOUNTER — Ambulatory Visit: Payer: Self-pay | Admitting: Family Medicine

## 2024-09-05 ENCOUNTER — Encounter: Payer: Self-pay | Admitting: Family Medicine

## 2024-09-05 VITALS — BP 121/62 | HR 88 | Temp 97.9°F | Ht 68.0 in | Wt 170.6 lb

## 2024-09-05 DIAGNOSIS — E1159 Type 2 diabetes mellitus with other circulatory complications: Secondary | ICD-10-CM | POA: Diagnosis not present

## 2024-09-05 DIAGNOSIS — G8929 Other chronic pain: Secondary | ICD-10-CM

## 2024-09-05 DIAGNOSIS — E785 Hyperlipidemia, unspecified: Secondary | ICD-10-CM | POA: Diagnosis not present

## 2024-09-05 DIAGNOSIS — I152 Hypertension secondary to endocrine disorders: Secondary | ICD-10-CM | POA: Diagnosis not present

## 2024-09-05 DIAGNOSIS — E1169 Type 2 diabetes mellitus with other specified complication: Secondary | ICD-10-CM

## 2024-09-05 DIAGNOSIS — E1165 Type 2 diabetes mellitus with hyperglycemia: Secondary | ICD-10-CM

## 2024-09-05 DIAGNOSIS — F03918 Unspecified dementia, unspecified severity, with other behavioral disturbance: Secondary | ICD-10-CM

## 2024-09-05 DIAGNOSIS — G894 Chronic pain syndrome: Secondary | ICD-10-CM | POA: Diagnosis not present

## 2024-09-05 DIAGNOSIS — M25511 Pain in right shoulder: Secondary | ICD-10-CM

## 2024-09-05 DIAGNOSIS — Z79891 Long term (current) use of opiate analgesic: Secondary | ICD-10-CM | POA: Diagnosis not present

## 2024-09-10 DIAGNOSIS — I700 Atherosclerosis of aorta: Secondary | ICD-10-CM | POA: Diagnosis not present

## 2024-09-10 DIAGNOSIS — N390 Urinary tract infection, site not specified: Secondary | ICD-10-CM | POA: Diagnosis not present

## 2024-09-10 DIAGNOSIS — E669 Obesity, unspecified: Secondary | ICD-10-CM | POA: Diagnosis not present

## 2024-09-10 DIAGNOSIS — I10 Essential (primary) hypertension: Secondary | ICD-10-CM | POA: Diagnosis not present

## 2024-09-10 DIAGNOSIS — E440 Moderate protein-calorie malnutrition: Secondary | ICD-10-CM | POA: Diagnosis not present

## 2024-09-10 DIAGNOSIS — M19011 Primary osteoarthritis, right shoulder: Secondary | ICD-10-CM | POA: Diagnosis not present

## 2024-09-10 DIAGNOSIS — F0283 Dementia in other diseases classified elsewhere, unspecified severity, with mood disturbance: Secondary | ICD-10-CM | POA: Diagnosis not present

## 2024-09-10 DIAGNOSIS — K5730 Diverticulosis of large intestine without perforation or abscess without bleeding: Secondary | ICD-10-CM | POA: Diagnosis not present

## 2024-09-10 DIAGNOSIS — M85811 Other specified disorders of bone density and structure, right shoulder: Secondary | ICD-10-CM | POA: Diagnosis not present

## 2024-09-10 DIAGNOSIS — G309 Alzheimer's disease, unspecified: Secondary | ICD-10-CM | POA: Diagnosis not present

## 2024-09-10 DIAGNOSIS — E119 Type 2 diabetes mellitus without complications: Secondary | ICD-10-CM | POA: Diagnosis not present

## 2024-09-10 DIAGNOSIS — Z6826 Body mass index (BMI) 26.0-26.9, adult: Secondary | ICD-10-CM | POA: Diagnosis not present

## 2024-09-10 DIAGNOSIS — B961 Klebsiella pneumoniae [K. pneumoniae] as the cause of diseases classified elsewhere: Secondary | ICD-10-CM | POA: Diagnosis not present

## 2024-09-11 ENCOUNTER — Encounter: Payer: Self-pay | Admitting: Family Medicine

## 2024-09-11 DIAGNOSIS — G8929 Other chronic pain: Secondary | ICD-10-CM | POA: Diagnosis not present

## 2024-09-11 DIAGNOSIS — N3000 Acute cystitis without hematuria: Secondary | ICD-10-CM | POA: Diagnosis not present

## 2024-09-11 DIAGNOSIS — R413 Other amnesia: Secondary | ICD-10-CM | POA: Diagnosis not present

## 2024-09-11 DIAGNOSIS — N3281 Overactive bladder: Secondary | ICD-10-CM | POA: Diagnosis not present

## 2024-09-11 DIAGNOSIS — G894 Chronic pain syndrome: Secondary | ICD-10-CM | POA: Insufficient documentation

## 2024-09-11 DIAGNOSIS — Z79891 Long term (current) use of opiate analgesic: Secondary | ICD-10-CM | POA: Insufficient documentation

## 2024-09-11 DIAGNOSIS — M545 Low back pain, unspecified: Secondary | ICD-10-CM | POA: Diagnosis not present

## 2024-09-11 DIAGNOSIS — M25511 Pain in right shoulder: Secondary | ICD-10-CM | POA: Diagnosis not present

## 2024-09-11 NOTE — Progress Notes (Signed)
 New Patient Office Visit  Subjective    Patient ID: [name], female    DOB: 1946-08-22  Age: 78 y.o. MRN: [id number]  CC:  Chief Complaint  Patient presents with   Establish Care    HPI [name] presents to establish care.  History of Present Illness   [name] is a 78 year old female with type 2 diabetes and hypertension who presents to establish care.   She has a history of dementia. Her husband helps to provide care. He was not present for her visit today.   Shoulder pain - Intermittent pain for the past month, though appears to be chronic per chart review - Worsens at night and disrupts sleep when lying on the affected side - Uses heat pad at night for relief - No associated numbness or tingling - Recent shoulder x-ray performed in ER with mild arthritis  Back pain - Pain primarily upon waking - Manages pain with oxycodone , taking one pill in the morning and additional doses as needed during increased activity - No pain in legs or neck - No numbness or tingling in legs - Previous PCP was prescribed oxycodone  IR 15 mg 4x a day.   Type 2 diabetes mellitus - Recent hemoglobin A1c of 6.8 - Not taking medication for diabetes - Does not regularly monitor blood glucose levels  Hyperlipidemia - Recent laboratory results show elevated cholesterol levels - Not on medication for hyperlipidemia  Dementia - Reports compliance with aricept  General constitutional and cardiopulmonary symptoms - No chest pain - No shortness of breath - No swelling in feet or ankles  Gastrointestinal symptoms - No nausea or vomiting - No heartburn - No trouble swallowing  Recent laboratory findings - Recent labs show good thyroid  function, urine albumin, B12, and kidney function       Outpatient Encounter Medications as of 09/05/2024  Medication Sig   oxyCODONE -acetaminophen  (PERCOCET/ROXICET) 5-325 MG tablet Take 1 tablet by mouth every 4 (four) hours as needed.    acyclovir  (ZOVIRAX ) 400 MG tablet Take 400 mg by mouth 2 (two) times daily.  (Patient not taking: Reported on 09/05/2024)   atorvastatin  (LIPITOR) 40 MG tablet Take 40 mg by mouth at bedtime. (Patient not taking: Reported on 09/05/2024)   B Complex-C (B-COMPLEX WITH VITAMIN C) tablet Take 1 tablet by mouth daily. (Patient not taking: Reported on 09/05/2024)   Biotin 5 MG CAPS Take 1 capsule by mouth daily. (Patient not taking: Reported on 09/05/2024)   Cholecalciferol (VITAMIN D3) 1.25 MG (50000 UT) CAPS Take 10,000 Units by mouth daily at 6 (six) AM. (Patient not taking: Reported on 09/05/2024)   docusate sodium  (COLACE) 100 MG capsule Take 100 mg by mouth 2 (two) times daily. (Patient not taking: Reported on 09/05/2024)   donepezil (ARICEPT) 5 MG tablet Take 5 mg by mouth daily. (Patient not taking: Reported on 09/05/2024)   EPINEPHrine  0.3 mg/0.3 mL IJ SOAJ injection Inject 0.3 mg into the muscle as needed for anaphylaxis. Use as needed (Patient not taking: Reported on 09/05/2024)   feeding supplement (ENSURE PLUS HIGH PROTEIN) LIQD Take 237 mLs by mouth 3 (three) times daily between meals. (Patient not taking: Reported on 09/05/2024)   glucose blood (ACCU-CHEK AVIVA PLUS) test strip 1 each by Other route as needed for other. (Patient not taking: Reported on 09/05/2024)   lisinopril  (PRINIVIL ,ZESTRIL ) 40 MG tablet Take 40 mg by mouth daily. (Patient not taking: Reported on 09/05/2024)   Methyl Salicylate -Lido-Menthol  4-4-5 % PTCH  Apply 1 patch topically in the morning and at bedtime. (Patient not taking: Reported on 09/05/2024)   Multiple Vitamins-Minerals (PRESERVISION AREDS 2 PO) Take 2 tablets by mouth daily.  (Patient not taking: Reported on 09/05/2024)   pantoprazole (PROTONIX) 40 MG tablet Take 1 tablet by mouth 2 (two) times daily. (Patient not taking: Reported on 09/05/2024)   pyridOXINE (VITAMIN B-6) 100 MG tablet Take 100 mg by mouth daily. (Patient not taking: Reported on 09/05/2024)   vitamin B-12  (CYANOCOBALAMIN ) 1000 MCG tablet Take 2,500 mcg by mouth daily. (Patient not taking: Reported on 09/05/2024)   Vitamins-Lipotropics (EAR HEALTH PLUS PO) Take 1 tablet by mouth daily. (Patient not taking: Reported on 09/05/2024)   No facility-administered encounter medications on file as of 09/05/2024.    Past Medical History:  Diagnosis Date   Alzheimer's dementia (HCC)    Anginal pain    Atypical chest pain    Depression    Hyperlipidemia    Hypertension    Kidney tumor    Obesity     Past Surgical History:  Procedure Laterality Date   ABDOMINAL HYSTERECTOMY     CARDIAC CATHETERIZATION  2009   normal coronary arteries, preserved LVEF 60%   CHOLECYSTECTOMY  2006   Dr. Kimble.   COLONOSCOPY  2004   Dr. Kristie: hemorrhoids, diverticulosis, some stool present and small lesions could have been missed. repeat colonoscopy in five years.    COLONOSCOPY  2016   Dr. Kristie: unremarkable per patient   EMBOLIZATION     HERNIA REPAIR     IR RADIOLOGIST EVAL & MGMT  12/31/2020   TONSILLECTOMY      Family History  Problem Relation Age of Onset   Diabetes Mother    Hypertension Mother    Stroke Father    Cancer Sister        breast   Breast cancer Sister    Colon cancer Brother        deceased age 23   Other Son        chron's    Breast cancer Maternal Grandmother     Social History   Socioeconomic History   Marital status: Married    Spouse name: Firefighter   Number of children: Not on file   Years of education: Not on file   Highest education level: Not on file  Occupational History   Occupation: retired  Tobacco Use   Smoking status: Never   Smokeless tobacco: Never  Vaping Use   Vaping status: Never Used  Substance and Sexual Activity   Alcohol use: No   Drug use: No   Sexual activity: Yes    Birth control/protection: Post-menopausal, Surgical  Other Topics Concern   Not on file  Social History Narrative   Pt lives in 1 story home with husband   Has 2 adult children    High school diploma - a few hobby college courses   Ret CSR from 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    Social Drivers of Corporate Investment Banker Strain: Low Risk  (02/27/2023)   Overall Financial Resource Strain (CARDIA)    Difficulty of Paying Living Expenses: Not very hard  Food Insecurity: No Food Insecurity (08/29/2024)   Hunger Vital Sign    Worried About Running Out of Food in the Last Year: Never true    Ran Out of Food in the Last Year: Never true  Transportation Needs: No Transportation Needs (08/29/2024)   PRAPARE -  Administrator, Civil Service (Medical): No    Lack of Transportation (Non-Medical): No  Physical Activity: Inactive (04/24/2023)   Exercise Vital Sign    Days of Exercise per Week: 0 days    Minutes of Exercise per Session: 0 min  Stress: Stress Concern Present (02/25/2022)   [employer] of Occupational Health - Occupational Stress Questionnaire    Feeling of Stress : Very much  Social Connections: Moderately Integrated (08/29/2024)   Social Connection and Isolation Panel    Frequency of Communication with Friends and Family: Three times a week    Frequency of Social Gatherings with Friends and Family: Three times a week    Attends Religious Services: 1 to 4 times per year    Active Member of Clubs or Organizations: No    Attends Banker Meetings: Never    Marital Status: Married  Catering Manager Violence: Not At Risk (08/29/2024)   Humiliation, Afraid, Rape, and Kick questionnaire    Fear of Current or Ex-Partner: No    Emotionally Abused: No    Physically Abused: No    Sexually Abused: No    ROS Negative unless specially indicated above in HPI.      Objective    BP 121/62   Pulse 88   Temp 97.9 F (36.6 C) (Temporal)   Ht 5' 8 (1.727 m)   Wt 170 lb 9.6 oz (77.4 kg)   SpO2 97%   BMI 25.94 kg/m   Wt Readings from Last 3 Encounters:  09/05/24 170 lb 9.6 oz (77.4 kg)  08/30/24 171 lb 4.8  oz (77.7 kg)  08/28/24 180 lb (81.6 kg)    Physical Exam Vitals and nursing note reviewed.  Constitutional:      General: She is not in acute distress.    Appearance: Normal appearance. She is not ill-appearing.  Cardiovascular:     Rate and Rhythm: Normal rate and regular rhythm.     Pulses: Normal pulses.     Heart sounds: Normal heart sounds. No murmur heard. Pulmonary:     Effort: Pulmonary effort is normal. No respiratory distress.     Breath sounds: Normal breath sounds.  Abdominal:     General: Bowel sounds are normal. There is no distension.     Palpations: Abdomen is soft. There is no mass.     Tenderness: There is no abdominal tenderness. There is no guarding or rebound.  Musculoskeletal:     Cervical back: Neck supple. No tenderness.     Right lower leg: No edema.     Left lower leg: No edema.  Lymphadenopathy:     Cervical: No cervical adenopathy.  Skin:    General: Skin is warm and dry.  Neurological:     General: No focal deficit present.     Mental Status: She is alert and oriented to person, place, and time.  Psychiatric:        Mood and Affect: Mood normal.        Behavior: Behavior normal.        Cognition and Memory: Memory is impaired.         Assessment & Plan:   Linah was seen today for establish care.  Diagnoses and all orders for this visit:  Type 2 diabetes mellitus with hyperglycemia, without long-term current use of insulin  (HCC)  Hypertension associated with type 2 diabetes mellitus (HCC)  Hyperlipidemia associated with type 2 diabetes mellitus (HCC)  Chronic right shoulder pain -  Ambulatory referral to Orthopedic Surgery  Chronic pain syndrome -     Ambulatory referral to Pain Clinic  Chronic prescription opiate use -     Ambulatory referral to Pain Clinic  Dementia with behavioral disturbance (HCC)   Assessment and Plan    Chronic right shoulder pain Intermittent right shoulder pain for one month, worsens at night,  mild arthritis on x-ray. - Referred to orthopedic specialist in [place].  Chronic pain syndrome Chronic prescription opiate use - referral to pain management discussed and and placed  Type 2 diabetes mellitus A1c 6.8, well-controlled without medication.  Hypertension Not on medications. BP at goal.   Hyperlipidemia High cholesterol, no current medication, patient agreeable to start treatment. - Prescribed medication for hyperlipidemia.     Dementia Continue aricept.   Return in about 3 months (around 12/06/2024) for chronic follow up.  The patient indicates understanding of these issues and agrees with the plan.    Carolyn CHRISTELLA Search, FNP

## 2024-09-13 DIAGNOSIS — E669 Obesity, unspecified: Secondary | ICD-10-CM | POA: Diagnosis not present

## 2024-09-13 DIAGNOSIS — I10 Essential (primary) hypertension: Secondary | ICD-10-CM | POA: Diagnosis not present

## 2024-09-13 DIAGNOSIS — I700 Atherosclerosis of aorta: Secondary | ICD-10-CM | POA: Diagnosis not present

## 2024-09-13 DIAGNOSIS — M19011 Primary osteoarthritis, right shoulder: Secondary | ICD-10-CM | POA: Diagnosis not present

## 2024-09-13 DIAGNOSIS — N390 Urinary tract infection, site not specified: Secondary | ICD-10-CM | POA: Diagnosis not present

## 2024-09-13 DIAGNOSIS — G309 Alzheimer's disease, unspecified: Secondary | ICD-10-CM | POA: Diagnosis not present

## 2024-09-13 DIAGNOSIS — E440 Moderate protein-calorie malnutrition: Secondary | ICD-10-CM | POA: Diagnosis not present

## 2024-09-13 DIAGNOSIS — B961 Klebsiella pneumoniae [K. pneumoniae] as the cause of diseases classified elsewhere: Secondary | ICD-10-CM | POA: Diagnosis not present

## 2024-09-13 DIAGNOSIS — Z6826 Body mass index (BMI) 26.0-26.9, adult: Secondary | ICD-10-CM | POA: Diagnosis not present

## 2024-09-13 DIAGNOSIS — K5730 Diverticulosis of large intestine without perforation or abscess without bleeding: Secondary | ICD-10-CM | POA: Diagnosis not present

## 2024-09-13 DIAGNOSIS — M85811 Other specified disorders of bone density and structure, right shoulder: Secondary | ICD-10-CM | POA: Diagnosis not present

## 2024-09-13 DIAGNOSIS — F0283 Dementia in other diseases classified elsewhere, unspecified severity, with mood disturbance: Secondary | ICD-10-CM | POA: Diagnosis not present

## 2024-09-13 DIAGNOSIS — E119 Type 2 diabetes mellitus without complications: Secondary | ICD-10-CM | POA: Diagnosis not present

## 2024-09-16 ENCOUNTER — Other Ambulatory Visit: Payer: Self-pay

## 2024-09-16 ENCOUNTER — Encounter (HOSPITAL_COMMUNITY): Payer: Self-pay | Admitting: Emergency Medicine

## 2024-09-16 ENCOUNTER — Emergency Department (HOSPITAL_COMMUNITY)
Admission: EM | Admit: 2024-09-16 | Discharge: 2024-09-16 | Disposition: A | Discharge status: Home / Self Care | Attending: Emergency Medicine | Admitting: Emergency Medicine

## 2024-09-16 DIAGNOSIS — F0280 Dementia in other diseases classified elsewhere without behavioral disturbance: Secondary | ICD-10-CM | POA: Insufficient documentation

## 2024-09-16 DIAGNOSIS — Z79899 Other long term (current) drug therapy: Secondary | ICD-10-CM | POA: Insufficient documentation

## 2024-09-16 DIAGNOSIS — I10 Essential (primary) hypertension: Secondary | ICD-10-CM | POA: Diagnosis not present

## 2024-09-16 DIAGNOSIS — E119 Type 2 diabetes mellitus without complications: Secondary | ICD-10-CM | POA: Insufficient documentation

## 2024-09-16 DIAGNOSIS — G309 Alzheimer's disease, unspecified: Secondary | ICD-10-CM | POA: Diagnosis not present

## 2024-09-16 DIAGNOSIS — R300 Dysuria: Secondary | ICD-10-CM | POA: Diagnosis not present

## 2024-09-16 DIAGNOSIS — N3090 Cystitis, unspecified without hematuria: Secondary | ICD-10-CM | POA: Insufficient documentation

## 2024-09-16 LAB — URINALYSIS, W/ REFLEX TO CULTURE (INFECTION SUSPECTED)
Bilirubin Urine: NEGATIVE
Glucose, UA: NEGATIVE mg/dL
Ketones, ur: NEGATIVE mg/dL
Nitrite: POSITIVE — AB
Protein, ur: 100 mg/dL — AB
RBC / HPF: >50 RBC/hpf (ref 0–5)
Specific Gravity, Urine: 1.024 (ref 1.005–1.030)
WBC, UA: >50 WBC/hpf (ref 0–5)
pH: 5 (ref 5.0–8.0)

## 2024-09-16 MED ORDER — CEFTRIAXONE SODIUM 1 G IJ SOLR
1.0000 g | Freq: Once | INTRAMUSCULAR | Status: AC
  Administered 2024-09-16: 1 g via INTRAMUSCULAR
  Filled 2024-09-16: qty 10

## 2024-09-16 MED ORDER — LIDOCAINE HCL (PF) 1 % IJ SOLN
INTRAMUSCULAR | Status: AC
  Administered 2024-09-16: 5 mL
  Filled 2024-09-16: qty 5

## 2024-09-16 MED ORDER — DONEPEZIL HCL 5 MG PO TABS: 5.0000 mg | ORAL_TABLET | Freq: Every day | ORAL | Status: DC

## 2024-09-16 MED ORDER — CEPHALEXIN 500 MG PO CAPS: 500.0000 mg | ORAL_CAPSULE | Freq: Four times a day (QID) | ORAL | 0 refills | Status: DC

## 2024-09-16 MED ORDER — PANTOPRAZOLE SODIUM 40 MG PO TBEC: 40.0000 mg | DELAYED_RELEASE_TABLET | Freq: Two times a day (BID) | ORAL | Status: DC

## 2024-09-16 NOTE — ED Notes (Addendum)
 RN spoke with pt's spouse Deshaun Schou in preop and was given two additional phone numbers for family members, Spoke with Allena who is pt's sister in social worker and Joe who is pt's brother in law, was advised that Allena would be able to pick pt up but it would be about two hours before she would be able to.  Pt's spouse updated on plan of care and in agreement, spouse reports that pt had driven herself to hospital this am to see him while he is upstairs.   Allena Lamp [phone] Joe [phone]

## 2024-09-16 NOTE — ED Notes (Signed)
 Pt aware urine specimen needed, cup provided

## 2024-09-16 NOTE — ED Notes (Addendum)
 CSW received a consult for patient about her history of Dementia and being alone in the ED. CSW sent nurse a secure chat to see what was going on, nurse stated that family have been called and made aware of patient DC today. CSW cleared consult.    Addendum 2:30 pm  CSW spoke with family and patient at bedside.Family asked bout in home care services for patent since she lives in the home alone. CSW offered [facility] and explained what HH was. CSW also shared resources for Home Care services, which are private pay. Family declined home care services , due to income barriers. CSW did mention an alternative plan to family about checking with [facility] DSS regarding community resources funded through [place], if patient is eligible. ICM Signing off.

## 2024-09-16 NOTE — ED Triage Notes (Signed)
 Pt reports concern for UTI since yesterday, has discomfort and urgency, has not been seen by PCP

## 2024-09-16 NOTE — Discharge Instructions (Addendum)
 You were given your first dose of antibiotic in the emergency department for treatment of UTI.  This will cover you until tomorrow.  A prescription for ongoing antibiotics was sent to your pharmacy.  Take as prescribed.  Return to the emergency department for any new or worsening symptoms of concern.

## 2024-09-16 NOTE — ED Provider Notes (Addendum)
 [place] EMERGENCY DEPARTMENT AT [facility] Provider Note   CSN: [id number] Arrival date & time: 09/16/24  0930     Patient presents with: UTI   [name] is a 78 y.o. female.   HPI Patient presents for urinary urgency and dysuria.  Medical history includes dementia, HTN, HLD, depression, chronic pain, DM.  Onset of urinary symptoms was yesterday.  Patient in the ED visiting her husband who was admitted currently.  She denies any other recent or current symptoms.    Prior to Admission medications   Medication Sig Start Date End Date Taking? Authorizing Provider  cephALEXin  (KEFLEX ) 500 MG capsule Take 1 capsule (500 mg total) by mouth 4 (four) times daily. 09/16/24  Yes Melvenia Motto, MD  acyclovir  (ZOVIRAX ) 400 MG tablet Take 400 mg by mouth 2 (two) times daily.  Patient not taking: Reported on 09/05/2024    [provider]  atorvastatin  (LIPITOR) 40 MG tablet Take 40 mg by mouth at bedtime. Patient not taking: Reported on 09/05/2024 08/29/24   [provider]  B Complex-C (B-COMPLEX WITH VITAMIN C) tablet Take 1 tablet by mouth daily. Patient not taking: Reported on 09/05/2024    [provider]  Biotin 5 MG CAPS Take 1 capsule by mouth daily. Patient not taking: Reported on 09/05/2024    [provider]  Cholecalciferol (VITAMIN D3) 1.25 MG (50000 UT) CAPS Take 10,000 Units by mouth daily at 6 (six) AM. Patient not taking: Reported on 09/05/2024    [provider]  docusate sodium  (COLACE) 100 MG capsule Take 100 mg by mouth 2 (two) times daily. Patient not taking: Reported on 09/05/2024    [provider]  donepezil (ARICEPT) 5 MG tablet Take 5 mg by mouth daily. Patient not taking: Reported on 09/05/2024 08/19/24   [provider]  EPINEPHrine  0.3 mg/0.3 mL IJ SOAJ injection Inject 0.3 mg into the muscle as needed for anaphylaxis. Use as needed Patient not taking: Reported on 09/05/2024    [provider]  feeding supplement (ENSURE PLUS HIGH PROTEIN) LIQD Take 237 mLs by mouth 3 (three) times daily between meals. Patient not taking: Reported on 09/05/2024 09/04/24   Ricky Fines, MD  glucose blood (ACCU-CHEK AVIVA PLUS) test strip 1 each by Other route as needed for other. Patient not taking: Reported on 09/05/2024 08/27/15   [provider]  lisinopril  (PRINIVIL ,ZESTRIL ) 40 MG tablet Take 40 mg by mouth daily. Patient not taking: Reported on 09/05/2024    [provider]  Methyl Salicylate -Lido-Menthol  4-4-5 % PTCH Apply 1 patch topically in the morning and at bedtime. Patient not taking: Reported on 09/05/2024 11/22/22   Garrick Charleston, MD  Multiple Vitamins-Minerals (PRESERVISION AREDS 2 PO) Take 2 tablets by mouth daily.  Patient not taking: Reported on 09/05/2024    [provider]  oxyCODONE -acetaminophen  (PERCOCET/ROXICET) 5-325 MG tablet Take 1 tablet by mouth every 4 (four) hours as needed. 08/28/24   Triplett, Tammy, PA-C  pantoprazole (PROTONIX) 40 MG tablet Take 1 tablet by mouth 2 (two) times daily. Patient not taking: Reported on 09/05/2024    [provider]  pyridOXINE (VITAMIN B-6) 100 MG tablet Take 100 mg by mouth daily. Patient not taking: Reported on 09/05/2024    [provider]  vitamin B-12 (CYANOCOBALAMIN ) 1000 MCG tablet Take 2,500 mcg by mouth daily. Patient not taking: Reported on 09/05/2024    [provider]  Vitamins-Lipotropics (EAR HEALTH PLUS PO) Take 1 tablet by mouth daily. Patient  not taking: Reported on 09/05/2024    [provider]    Allergies: Bee venom    Review of Systems  Genitourinary:  Positive for dysuria, frequency and urgency.  All other systems reviewed and are negative.   Updated Vital Signs BP 113/69 (BP Location: Right Arm)   Pulse 85   Temp 98 F (36.7 C) (Temporal)   Resp 16   Ht 5' 8 (1.727 m)   Wt 77.1 kg   SpO2 96%   BMI 25.85 kg/m   Physical  Exam Vitals and nursing note reviewed.  Constitutional:      General: She is not in acute distress.    Appearance: Normal appearance. She is well-developed. She is not ill-appearing, toxic-appearing or diaphoretic.  HENT:     Head: Normocephalic and atraumatic.     Right Ear: External ear normal.     Left Ear: External ear normal.     Nose: Nose normal.     Mouth/Throat:     Mouth: Mucous membranes are moist.  Eyes:     Extraocular Movements: Extraocular movements intact.     Conjunctiva/sclera: Conjunctivae normal.  Cardiovascular:     Rate and Rhythm: Normal rate and regular rhythm.  Pulmonary:     Effort: Pulmonary effort is normal. No respiratory distress.  Abdominal:     General: There is no distension.     Palpations: Abdomen is soft.     Tenderness: There is no abdominal tenderness.  Musculoskeletal:        General: No swelling.     Cervical back: Normal range of motion and neck supple.  Skin:    General: Skin is warm and dry.  Neurological:     General: No focal deficit present.     Mental Status: She is alert.  Psychiatric:        Mood and Affect: Mood normal.        Behavior: Behavior normal.     (all labs ordered are listed, but only abnormal results are displayed) Labs Reviewed  URINALYSIS, W/ REFLEX TO CULTURE (INFECTION SUSPECTED) - Abnormal; Notable for the following components:      Result Value   Color, Urine AMBER (*)    APPearance CLOUDY (*)    Hgb urine dipstick SMALL (*)    Protein, ur 100 (*)    Nitrite POSITIVE (*)    Leukocytes,Ua MODERATE (*)    Bacteria, UA MANY (*)    All other components within normal limits  URINE CULTURE    EKG: None  Radiology: No results found.   Procedures   Medications Ordered in the ED  cefTRIAXone (ROCEPHIN) injection 1 g (has no administration in time range)                                    Medical Decision Making Amount and/or Complexity of Data Reviewed Labs: ordered.  Risk Prescription  drug management.   Patient presenting for dysuria and urinary frequency/urgency since yesterday.  On arrival in the ED, vital signs are normal.  Patient is well-appearing on exam.  Abdomen is soft and nontender.  Urine studies ordered.  After patient urinated, postvoid residual bladder scan did not show any evidence of urinary retention.  UA does show nitrate positive UTI.  Dose of ceftriaxone was ordered.  Patient prescribed ongoing antibiotics.  Initial plan was for discharge, however, patient does have a documented history of Alzheimer's disease.  She does have evidence of short-term memory loss while in the ED.  She did reportedly drive to the hospital to visit her husband and was sent down from his room.  Patient unable to return to his room given that he is reportedly undergoing surgery today.  Will consult TOC.  Home medications ordered.  Diet ordered.  Social work was able to contact family who did arrive in the ED.  Patient discharged in stable condition.     Final diagnoses:  Cystitis    ED Discharge Orders          Ordered    cephALEXin  (KEFLEX ) 500 MG capsule  4 times daily        09/16/24 1201               Melvenia Motto, MD 09/16/24 1201    Melvenia Motto, MD 09/16/24 1242    Melvenia Motto, MD 09/16/24 1423

## 2024-09-16 NOTE — ED Notes (Signed)
 Attempted to locate urine in bladder for approx. 5 mins.     09/16/24 1117  Urine Measurement/Characteristics  Urinary Interventions Bladder scan  Bladder Scan Volume (mL) 0 mL

## 2024-09-16 NOTE — ED Notes (Signed)
 Pt family ETA 2hours

## 2024-09-16 NOTE — ED Notes (Signed)
 Upon discharge, noted to have discharge restriction of dementia, do not discharge to lobby. Discussed with patient, provider, charge nurse, and house supervisor. Husband is only point of contact in the chart and is admitted to [facility], currently being prepared for surgery. Patient states she drove herself here.   House supervisor was able to get in touch with two family members after going to speak with husband upstairs and will be assisting with getting patient connected with family.

## 2024-09-17 DIAGNOSIS — E440 Moderate protein-calorie malnutrition: Secondary | ICD-10-CM | POA: Diagnosis not present

## 2024-09-17 DIAGNOSIS — M19011 Primary osteoarthritis, right shoulder: Secondary | ICD-10-CM | POA: Diagnosis not present

## 2024-09-17 DIAGNOSIS — G309 Alzheimer's disease, unspecified: Secondary | ICD-10-CM | POA: Diagnosis not present

## 2024-09-17 DIAGNOSIS — E119 Type 2 diabetes mellitus without complications: Secondary | ICD-10-CM | POA: Diagnosis not present

## 2024-09-17 DIAGNOSIS — B961 Klebsiella pneumoniae [K. pneumoniae] as the cause of diseases classified elsewhere: Secondary | ICD-10-CM | POA: Diagnosis not present

## 2024-09-17 DIAGNOSIS — F0283 Dementia in other diseases classified elsewhere, unspecified severity, with mood disturbance: Secondary | ICD-10-CM | POA: Diagnosis not present

## 2024-09-17 DIAGNOSIS — I700 Atherosclerosis of aorta: Secondary | ICD-10-CM | POA: Diagnosis not present

## 2024-09-17 DIAGNOSIS — N390 Urinary tract infection, site not specified: Secondary | ICD-10-CM | POA: Diagnosis not present

## 2024-09-17 DIAGNOSIS — Z6826 Body mass index (BMI) 26.0-26.9, adult: Secondary | ICD-10-CM | POA: Diagnosis not present

## 2024-09-17 DIAGNOSIS — M85811 Other specified disorders of bone density and structure, right shoulder: Secondary | ICD-10-CM | POA: Diagnosis not present

## 2024-09-17 DIAGNOSIS — E669 Obesity, unspecified: Secondary | ICD-10-CM | POA: Diagnosis not present

## 2024-09-17 DIAGNOSIS — K5730 Diverticulosis of large intestine without perforation or abscess without bleeding: Secondary | ICD-10-CM | POA: Diagnosis not present

## 2024-09-17 DIAGNOSIS — I10 Essential (primary) hypertension: Secondary | ICD-10-CM | POA: Diagnosis not present

## 2024-09-18 DIAGNOSIS — E669 Obesity, unspecified: Secondary | ICD-10-CM | POA: Diagnosis not present

## 2024-09-18 DIAGNOSIS — I10 Essential (primary) hypertension: Secondary | ICD-10-CM | POA: Diagnosis not present

## 2024-09-18 DIAGNOSIS — K5730 Diverticulosis of large intestine without perforation or abscess without bleeding: Secondary | ICD-10-CM | POA: Diagnosis not present

## 2024-09-18 DIAGNOSIS — G309 Alzheimer's disease, unspecified: Secondary | ICD-10-CM | POA: Diagnosis not present

## 2024-09-18 DIAGNOSIS — E119 Type 2 diabetes mellitus without complications: Secondary | ICD-10-CM | POA: Diagnosis not present

## 2024-09-18 DIAGNOSIS — M19011 Primary osteoarthritis, right shoulder: Secondary | ICD-10-CM | POA: Diagnosis not present

## 2024-09-18 DIAGNOSIS — Z6826 Body mass index (BMI) 26.0-26.9, adult: Secondary | ICD-10-CM | POA: Diagnosis not present

## 2024-09-18 DIAGNOSIS — I700 Atherosclerosis of aorta: Secondary | ICD-10-CM | POA: Diagnosis not present

## 2024-09-18 DIAGNOSIS — F0283 Dementia in other diseases classified elsewhere, unspecified severity, with mood disturbance: Secondary | ICD-10-CM | POA: Diagnosis not present

## 2024-09-18 DIAGNOSIS — E440 Moderate protein-calorie malnutrition: Secondary | ICD-10-CM | POA: Diagnosis not present

## 2024-09-18 DIAGNOSIS — M85811 Other specified disorders of bone density and structure, right shoulder: Secondary | ICD-10-CM | POA: Diagnosis not present

## 2024-09-18 DIAGNOSIS — B961 Klebsiella pneumoniae [K. pneumoniae] as the cause of diseases classified elsewhere: Secondary | ICD-10-CM | POA: Diagnosis not present

## 2024-09-18 DIAGNOSIS — N390 Urinary tract infection, site not specified: Secondary | ICD-10-CM | POA: Diagnosis not present

## 2024-09-18 LAB — URINE CULTURE: Culture: >=100000 — AB

## 2024-09-19 ENCOUNTER — Telehealth (HOSPITAL_BASED_OUTPATIENT_CLINIC_OR_DEPARTMENT_OTHER): Payer: Self-pay | Admitting: *Deleted

## 2024-09-20 NOTE — Telephone Encounter (Signed)
 Post ED Visit - Positive Culture Follow-up: Unsuccessful Patient Follow-up  Culture assessed and recommendations reviewed by:  [x]  Leonor Bash, Pharm.D. []  Venetia Gully, Pharm.D., BCPS AQ-ID []  Garrel Crews, Pharm.D., BCPS []  Almarie Lunger, Pharm.D., BCPS []  [place], [address].D., BCPS, AAHIVP []  Rosaline Bihari, Pharm.D., BCPS, AAHIVP []  Massie Rigg, PharmD []  Jodie Rower, PharmD, BCPS  Positive urine culture  []  Patient discharged without antimicrobial prescription and treatment is now indicated [x]  Organism is resistant to prescribed ED discharge antimicrobial []  Patient with positive blood cultures  Plan per Darice Showers, PA-C: If pt still having sx, would recommend return for IV abx given cx resistance. Unable to contact patient after 3 attempts, letter will be sent to address on file.  Left VM requesting a call back to [facility].  Lorita Barnie Pereyra 09/20/2024, 8:30 AM

## 2024-09-27 DIAGNOSIS — B961 Klebsiella pneumoniae [K. pneumoniae] as the cause of diseases classified elsewhere: Secondary | ICD-10-CM | POA: Diagnosis not present

## 2024-09-27 DIAGNOSIS — F0283 Dementia in other diseases classified elsewhere, unspecified severity, with mood disturbance: Secondary | ICD-10-CM | POA: Diagnosis not present

## 2024-09-27 DIAGNOSIS — N390 Urinary tract infection, site not specified: Secondary | ICD-10-CM | POA: Diagnosis not present

## 2024-09-27 DIAGNOSIS — M85811 Other specified disorders of bone density and structure, right shoulder: Secondary | ICD-10-CM | POA: Diagnosis not present

## 2024-09-27 DIAGNOSIS — Z6826 Body mass index (BMI) 26.0-26.9, adult: Secondary | ICD-10-CM | POA: Diagnosis not present

## 2024-09-27 DIAGNOSIS — I700 Atherosclerosis of aorta: Secondary | ICD-10-CM | POA: Diagnosis not present

## 2024-09-27 DIAGNOSIS — K5730 Diverticulosis of large intestine without perforation or abscess without bleeding: Secondary | ICD-10-CM | POA: Diagnosis not present

## 2024-09-27 DIAGNOSIS — I10 Essential (primary) hypertension: Secondary | ICD-10-CM | POA: Diagnosis not present

## 2024-09-27 DIAGNOSIS — E669 Obesity, unspecified: Secondary | ICD-10-CM | POA: Diagnosis not present

## 2024-09-27 DIAGNOSIS — G309 Alzheimer's disease, unspecified: Secondary | ICD-10-CM | POA: Diagnosis not present

## 2024-09-27 DIAGNOSIS — E119 Type 2 diabetes mellitus without complications: Secondary | ICD-10-CM | POA: Diagnosis not present

## 2024-09-27 DIAGNOSIS — E440 Moderate protein-calorie malnutrition: Secondary | ICD-10-CM | POA: Diagnosis not present

## 2024-09-27 DIAGNOSIS — M19011 Primary osteoarthritis, right shoulder: Secondary | ICD-10-CM | POA: Diagnosis not present

## 2024-10-01 DIAGNOSIS — I10 Essential (primary) hypertension: Secondary | ICD-10-CM | POA: Diagnosis not present

## 2024-10-01 DIAGNOSIS — Z6826 Body mass index (BMI) 26.0-26.9, adult: Secondary | ICD-10-CM | POA: Diagnosis not present

## 2024-10-01 DIAGNOSIS — M19011 Primary osteoarthritis, right shoulder: Secondary | ICD-10-CM | POA: Diagnosis not present

## 2024-10-01 DIAGNOSIS — E669 Obesity, unspecified: Secondary | ICD-10-CM | POA: Diagnosis not present

## 2024-10-01 DIAGNOSIS — G309 Alzheimer's disease, unspecified: Secondary | ICD-10-CM | POA: Diagnosis not present

## 2024-10-01 DIAGNOSIS — I700 Atherosclerosis of aorta: Secondary | ICD-10-CM | POA: Diagnosis not present

## 2024-10-01 DIAGNOSIS — N390 Urinary tract infection, site not specified: Secondary | ICD-10-CM | POA: Diagnosis not present

## 2024-10-01 DIAGNOSIS — K5730 Diverticulosis of large intestine without perforation or abscess without bleeding: Secondary | ICD-10-CM | POA: Diagnosis not present

## 2024-10-01 DIAGNOSIS — F0283 Dementia in other diseases classified elsewhere, unspecified severity, with mood disturbance: Secondary | ICD-10-CM | POA: Diagnosis not present

## 2024-10-01 DIAGNOSIS — B961 Klebsiella pneumoniae [K. pneumoniae] as the cause of diseases classified elsewhere: Secondary | ICD-10-CM | POA: Diagnosis not present

## 2024-10-01 DIAGNOSIS — M85811 Other specified disorders of bone density and structure, right shoulder: Secondary | ICD-10-CM | POA: Diagnosis not present

## 2024-10-01 DIAGNOSIS — E119 Type 2 diabetes mellitus without complications: Secondary | ICD-10-CM | POA: Diagnosis not present

## 2024-10-01 DIAGNOSIS — E440 Moderate protein-calorie malnutrition: Secondary | ICD-10-CM | POA: Diagnosis not present

## 2024-10-03 DIAGNOSIS — G309 Alzheimer's disease, unspecified: Secondary | ICD-10-CM | POA: Diagnosis not present

## 2024-10-03 DIAGNOSIS — I700 Atherosclerosis of aorta: Secondary | ICD-10-CM | POA: Diagnosis not present

## 2024-10-03 DIAGNOSIS — N390 Urinary tract infection, site not specified: Secondary | ICD-10-CM | POA: Diagnosis not present

## 2024-10-03 DIAGNOSIS — I10 Essential (primary) hypertension: Secondary | ICD-10-CM | POA: Diagnosis not present

## 2024-10-03 DIAGNOSIS — E119 Type 2 diabetes mellitus without complications: Secondary | ICD-10-CM | POA: Diagnosis not present

## 2024-10-03 DIAGNOSIS — E669 Obesity, unspecified: Secondary | ICD-10-CM | POA: Diagnosis not present

## 2024-10-03 DIAGNOSIS — B961 Klebsiella pneumoniae [K. pneumoniae] as the cause of diseases classified elsewhere: Secondary | ICD-10-CM | POA: Diagnosis not present

## 2024-10-03 DIAGNOSIS — M19011 Primary osteoarthritis, right shoulder: Secondary | ICD-10-CM | POA: Diagnosis not present

## 2024-10-03 DIAGNOSIS — M85811 Other specified disorders of bone density and structure, right shoulder: Secondary | ICD-10-CM | POA: Diagnosis not present

## 2024-10-03 DIAGNOSIS — Z6826 Body mass index (BMI) 26.0-26.9, adult: Secondary | ICD-10-CM | POA: Diagnosis not present

## 2024-10-03 DIAGNOSIS — E440 Moderate protein-calorie malnutrition: Secondary | ICD-10-CM | POA: Diagnosis not present

## 2024-10-03 DIAGNOSIS — F0283 Dementia in other diseases classified elsewhere, unspecified severity, with mood disturbance: Secondary | ICD-10-CM | POA: Diagnosis not present

## 2024-10-03 DIAGNOSIS — K5730 Diverticulosis of large intestine without perforation or abscess without bleeding: Secondary | ICD-10-CM | POA: Diagnosis not present

## 2024-10-07 DIAGNOSIS — N390 Urinary tract infection, site not specified: Secondary | ICD-10-CM | POA: Diagnosis not present

## 2024-10-07 DIAGNOSIS — E669 Obesity, unspecified: Secondary | ICD-10-CM | POA: Diagnosis not present

## 2024-10-07 DIAGNOSIS — E440 Moderate protein-calorie malnutrition: Secondary | ICD-10-CM | POA: Diagnosis not present

## 2024-10-07 DIAGNOSIS — K5730 Diverticulosis of large intestine without perforation or abscess without bleeding: Secondary | ICD-10-CM | POA: Diagnosis not present

## 2024-10-07 DIAGNOSIS — Z6826 Body mass index (BMI) 26.0-26.9, adult: Secondary | ICD-10-CM | POA: Diagnosis not present

## 2024-10-07 DIAGNOSIS — F0283 Dementia in other diseases classified elsewhere, unspecified severity, with mood disturbance: Secondary | ICD-10-CM | POA: Diagnosis not present

## 2024-10-07 DIAGNOSIS — M85811 Other specified disorders of bone density and structure, right shoulder: Secondary | ICD-10-CM | POA: Diagnosis not present

## 2024-10-07 DIAGNOSIS — M19011 Primary osteoarthritis, right shoulder: Secondary | ICD-10-CM | POA: Diagnosis not present

## 2024-10-07 DIAGNOSIS — I10 Essential (primary) hypertension: Secondary | ICD-10-CM | POA: Diagnosis not present

## 2024-10-07 DIAGNOSIS — E119 Type 2 diabetes mellitus without complications: Secondary | ICD-10-CM | POA: Diagnosis not present

## 2024-10-07 DIAGNOSIS — I700 Atherosclerosis of aorta: Secondary | ICD-10-CM | POA: Diagnosis not present

## 2024-10-07 DIAGNOSIS — G309 Alzheimer's disease, unspecified: Secondary | ICD-10-CM | POA: Diagnosis not present

## 2024-10-07 DIAGNOSIS — B961 Klebsiella pneumoniae [K. pneumoniae] as the cause of diseases classified elsewhere: Secondary | ICD-10-CM | POA: Diagnosis not present

## 2024-10-09 DIAGNOSIS — G309 Alzheimer's disease, unspecified: Secondary | ICD-10-CM | POA: Diagnosis not present

## 2024-10-09 DIAGNOSIS — I700 Atherosclerosis of aorta: Secondary | ICD-10-CM | POA: Diagnosis not present

## 2024-10-09 DIAGNOSIS — I10 Essential (primary) hypertension: Secondary | ICD-10-CM | POA: Diagnosis not present

## 2024-10-09 DIAGNOSIS — M19011 Primary osteoarthritis, right shoulder: Secondary | ICD-10-CM | POA: Diagnosis not present

## 2024-10-09 DIAGNOSIS — N390 Urinary tract infection, site not specified: Secondary | ICD-10-CM | POA: Diagnosis not present

## 2024-10-09 DIAGNOSIS — F0283 Dementia in other diseases classified elsewhere, unspecified severity, with mood disturbance: Secondary | ICD-10-CM | POA: Diagnosis not present

## 2024-10-09 DIAGNOSIS — E119 Type 2 diabetes mellitus without complications: Secondary | ICD-10-CM | POA: Diagnosis not present

## 2024-10-09 DIAGNOSIS — E440 Moderate protein-calorie malnutrition: Secondary | ICD-10-CM | POA: Diagnosis not present

## 2024-10-09 DIAGNOSIS — Z6826 Body mass index (BMI) 26.0-26.9, adult: Secondary | ICD-10-CM | POA: Diagnosis not present

## 2024-10-09 DIAGNOSIS — B961 Klebsiella pneumoniae [K. pneumoniae] as the cause of diseases classified elsewhere: Secondary | ICD-10-CM | POA: Diagnosis not present

## 2024-10-09 DIAGNOSIS — K5730 Diverticulosis of large intestine without perforation or abscess without bleeding: Secondary | ICD-10-CM | POA: Diagnosis not present

## 2024-10-09 DIAGNOSIS — E669 Obesity, unspecified: Secondary | ICD-10-CM | POA: Diagnosis not present

## 2024-10-09 DIAGNOSIS — M85811 Other specified disorders of bone density and structure, right shoulder: Secondary | ICD-10-CM | POA: Diagnosis not present

## 2024-10-15 ENCOUNTER — Ambulatory Visit: Admitting: Orthopedic Surgery

## 2024-10-17 DIAGNOSIS — F03B0 Unspecified dementia, moderate, without behavioral disturbance, psychotic disturbance, mood disturbance, and anxiety: Secondary | ICD-10-CM | POA: Diagnosis not present

## 2024-10-17 DIAGNOSIS — E1149 Type 2 diabetes mellitus with other diabetic neurological complication: Secondary | ICD-10-CM | POA: Diagnosis not present

## 2024-12-03 ENCOUNTER — Encounter: Payer: Self-pay | Admitting: *Deleted

## 2024-12-03 NOTE — Progress Notes (Signed)
 [name]                                          MRN: [id number]   12/03/2024   The [facility] Quality Team Specialist reviewed this patient medical record for the purposes of chart review for care gap closure. The following were reviewed: abstraction for care gap closure-controlling blood pressure.    [facility]I Quality Team

## 2024-12-05 ENCOUNTER — Other Ambulatory Visit: Payer: Self-pay

## 2024-12-05 ENCOUNTER — Encounter (HOSPITAL_COMMUNITY): Payer: Self-pay

## 2024-12-05 ENCOUNTER — Emergency Department (HOSPITAL_COMMUNITY)
Admission: EM | Admit: 2024-12-05 | Discharge: 2024-12-06 | Disposition: A | Source: Home / Self Care | Attending: Emergency Medicine | Admitting: Emergency Medicine

## 2024-12-05 DIAGNOSIS — N3000 Acute cystitis without hematuria: Secondary | ICD-10-CM

## 2024-12-05 NOTE — ED Triage Notes (Signed)
 Pt reports she is having burning with urination and at night she has LLQ pain and she has to hold her stomach to make it go away.  Symptoms present x 1 week.

## 2024-12-06 ENCOUNTER — Emergency Department (HOSPITAL_COMMUNITY)

## 2024-12-06 ENCOUNTER — Ambulatory Visit: Admitting: Family Medicine

## 2024-12-06 LAB — URINALYSIS, ROUTINE W REFLEX MICROSCOPIC
Bilirubin Urine: NEGATIVE
Glucose, UA: 50 mg/dL — AB
Ketones, ur: 5 mg/dL — AB
Nitrite: NEGATIVE
Protein, ur: 30 mg/dL — AB
Specific Gravity, Urine: 1.028 (ref 1.005–1.030)
WBC, UA: >50 WBC/hpf (ref 0–5)
pH: 5 (ref 5.0–8.0)

## 2024-12-06 MED ORDER — CEPHALEXIN 500 MG PO CAPS: 500.0000 mg | ORAL_CAPSULE | Freq: Four times a day (QID) | ORAL | 0 refills | Status: AC

## 2024-12-06 MED ORDER — CEPHALEXIN 500 MG PO CAPS
500.0000 mg | ORAL_CAPSULE | Freq: Once | ORAL | Status: AC
  Administered 2024-12-06: 500 mg via ORAL
  Filled 2024-12-06: qty 1

## 2024-12-06 NOTE — ED Notes (Signed)
 ED Provider at bedside.

## 2024-12-06 NOTE — ED Provider Notes (Signed)
 " [place] EMERGENCY DEPARTMENT AT [facility] Provider Note   CSN: [id number] Arrival date & time: 12/05/24  2246     Patient presents with: Dysuria   [name] is a 79 y.o. female.   The history is provided by the patient.  Patient with history of hypertension, hyperlipidemia presents with multiple complaints She reports left lower quadrant abdominal pain for over a week.  Seems to be worse when she lays flat.  No fevers or vomiting.  She has been having normal bowel movements, no constipation or diarrhea She also reports recurrent UTI, reports burning with urination and urgency No other acute complaints Patient reports she is not currently on antibiotics   Past Medical History:  Diagnosis Date   Alzheimer's dementia (HCC)    Anginal pain    Atypical chest pain    Depression    Hyperlipidemia    Hypertension    Kidney tumor    Obesity     Prior to Admission medications  Medication Sig Start Date End Date Taking? Authorizing Provider  cephALEXin  (KEFLEX ) 500 MG capsule Take 1 capsule (500 mg total) by mouth 4 (four) times daily. 12/06/24  Yes Midge Golas, MD  acyclovir  (ZOVIRAX ) 400 MG tablet Take 400 mg by mouth 2 (two) times daily.  Patient not taking: Reported on 09/05/2024    [provider]  atorvastatin  (LIPITOR) 40 MG tablet Take 40 mg by mouth at bedtime. Patient not taking: Reported on 09/05/2024 08/29/24   [provider]  B Complex-C (B-COMPLEX WITH VITAMIN C) tablet Take 1 tablet by mouth daily. Patient not taking: Reported on 09/05/2024    [provider]  Biotin 5 MG CAPS Take 1 capsule by mouth daily. Patient not taking: Reported on 09/05/2024    [provider]  Cholecalciferol  (VITAMIN D3) 1.25 MG (50000 UT) CAPS Take 10,000 Units by mouth daily at 6 (six) AM. Patient not taking: Reported on 09/05/2024    [provider]  docusate sodium  (COLACE) 100 MG capsule Take 100 mg by mouth 2 (two) times  daily. Patient not taking: Reported on 09/05/2024    [provider]  donepezil  (ARICEPT ) 5 MG tablet Take 5 mg by mouth daily. 08/19/24   [provider]  EPINEPHrine  0.3 mg/0.3 mL IJ SOAJ injection Inject 0.3 mg into the muscle as needed for anaphylaxis. Use as needed Patient not taking: Reported on 09/05/2024    [provider]  feeding supplement (ENSURE PLUS HIGH PROTEIN) LIQD Take 237 mLs by mouth 3 (three) times daily between meals. Patient not taking: Reported on 09/05/2024 09/04/24   Ricky Fines, MD  glucose blood (ACCU-CHEK AVIVA PLUS) test strip 1 each by Other route as needed for other. Patient not taking: Reported on 09/05/2024 08/27/15   [provider]  lisinopril  (PRINIVIL ,ZESTRIL ) 40 MG tablet Take 40 mg by mouth daily. Patient not taking: Reported on 09/05/2024    [provider]  Methyl Salicylate -Lido-Menthol  4-4-5 % PTCH Apply 1 patch topically in the morning and at bedtime. Patient not taking: Reported on 09/05/2024 11/22/22   Garrick Charleston, MD  Multiple Vitamins-Minerals (PRESERVISION AREDS 2 PO) Take 2 tablets by mouth daily.  Patient not taking: Reported on 09/05/2024    [provider]  pantoprazole  (PROTONIX ) 40 MG tablet Take 1 tablet by mouth 2 (two) times daily.    [provider]  pyridOXINE  (VITAMIN B-6) 100 MG tablet Take 100 mg by mouth daily. Patient not taking: Reported on 09/05/2024  [provider]  vitamin B-12 (CYANOCOBALAMIN ) 1000 MCG tablet Take 2,500 mcg by mouth daily. Patient not taking: Reported on 09/05/2024    [provider]  Vitamins-Lipotropics (EAR HEALTH PLUS PO) Take 1 tablet by mouth daily. Patient not taking: Reported on 09/05/2024    [provider]    Allergies: Bee venom    Review of Systems  Constitutional:  Negative for fever.  Gastrointestinal:  Positive for abdominal pain. Negative for constipation, diarrhea and vomiting.  Genitourinary:   Positive for dysuria and frequency. Negative for flank pain.    Updated Vital Signs BP (!) 149/79   Pulse 96   Temp 97.8 F (36.6 C) (Oral)   Resp 16   Wt 77.1 kg   SpO2 96%   BMI 25.85 kg/m   Physical Exam CONSTITUTIONAL: Elderly, no acute distress HEAD: Normocephalic/atraumatic CV: S1/S2 noted, no murmurs/rubs/gallops noted LUNGS: Lungs are clear to auscultation bilaterally, no apparent distress ABDOMEN: soft, mild LLQ tenderness, no rebound or guarding, bowel sounds noted throughout abdomen NEURO: Pt is awake/alert/appropriate, moves all extremitiesx4.  No facial droop.  Patient ambulatory EXTREMITIES: pulses normal/equal, full ROM  (all labs ordered are listed, but only abnormal results are displayed) Labs Reviewed  URINALYSIS, ROUTINE W REFLEX MICROSCOPIC - Abnormal; Notable for the following components:      Result Value   Color, Urine AMBER (*)    APPearance CLOUDY (*)    Glucose, UA 50 (*)    Hgb urine dipstick SMALL (*)    Ketones, ur 5 (*)    Protein, ur 30 (*)    Leukocytes,Ua LARGE (*)    Bacteria, UA RARE (*)    All other components within normal limits  URINE CULTURE    EKG: None  Radiology: CT Renal Stone Study Result Date: 12/06/2024 EXAM: CT ABDOMEN AND PELVIS WITHOUT CONTRAST 12/06/2024 12:24:06 AM TECHNIQUE: CT of the abdomen and pelvis was performed without the administration of intravenous contrast. Multiplanar reformatted images are provided for review. Automated exposure control, iterative reconstruction, and/or weight-based adjustment of the mA/kV was utilized to reduce the radiation dose to as low as reasonably achievable. COMPARISON: Comparison with 08/30/2024. CLINICAL HISTORY: Abdominal/flank pain, stone suspected. FINDINGS: LOWER CHEST: No acute abnormality. LIVER: The liver is unremarkable. GALLBLADDER AND BILE DUCTS: Cholecystectomy. No biliary ductal dilatation. SPLEEN: No acute abnormality. PANCREAS: No acute abnormality. ADRENAL GLANDS:  Unchanged 2.7 cm low-density nodule in the right adrenal gland compatible with a benign adenoma. No follow up recommended. KIDNEYS, URETERS AND BLADDER: Nonobstructing nephrolithiasis bilaterally. Unchanged 2.3 cm rim calcified exophytic lesion of the superior pole of the left kidney. Nondistended bladder. Small loculated gas in the anterior bladder. No hydronephrosis. No perinephric or periureteral stranding. GI AND BOWEL: Stomach demonstrates no acute abnormality. Normal appendix. There is no bowel obstruction. PERITONEUM AND RETROPERITONEUM: No ascites. No free air. VASCULATURE: Aorta is normal in caliber. Aortic atherosclerotic calcification. LYMPH NODES: No lymphadenopathy. REPRODUCTIVE ORGANS: Hysterectomy. BONES AND SOFT TISSUES: Chronic L5 compression fracture. No focal soft tissue abnormality. IMPRESSION: 1. Small loculated intravesical gas in the nondistended bladder; correlate for recent instrumentation and cystitis. 2. Nonobstructing nephrolithiasis bilaterally. Electronically signed by: Norman Gatlin MD 12/06/2024 12:37 AM EST RP Workstation: [id number]     Procedures   Medications Ordered in the ED  cephALEXin  (KEFLEX ) capsule 500 mg (has no administration in time range)    Clinical Course as of 12/06/24 0135  Fri Dec 06, 2024  0133 Patient presents for abdominal pain as well as symptoms of a UTI.  She is in no acute distress walking around the ER She is afebrile, not septic appearing, not vomiting On reassessment she is resting comfortably in no distress [DW]  0134 CT scan does not reveal any acute process except for possible UTI Urinalysis again shows recurrent UTI. Previous cultures are revealed multidrug resistance, though recent ER visits.  She has been prescribed Keflex  with good response  Will plan to start Keflex  with urine culture pending.  Since she is overall stable without fever or vomiting, admission not required at this time.  I did inform patient to be sure to answer  her phone calls in case they need to change her antibiotic [DW]    Clinical Course User Index [DW] Midge Golas, MD                                 Medical Decision Making Amount and/or Complexity of Data Reviewed Labs: ordered. Radiology: ordered.  Risk Prescription drug management.   This patient presents to the ED for concern of abdominal pain, this involves an extensive number of treatment options, and is a complaint that carries with it a high risk of complications and morbidity.  The differential diagnosis includes but is not limited to cholecystitis, cholelithiasis, pancreatitis, gastritis, peptic ulcer disease, appendicitis, bowel obstruction, bowel perforation, diverticulitis, AAA, ischemic bowel, UTI, kidney stone   Comorbidities that complicate the patient evaluation: Patients presentation is complicated by their history of hypertension  Social Determinants of Health: Patients frequent ER visits  increases the complexity of managing their presentation  Additional history obtained: Records reviewed Primary Care Documents  Lab Tests: I Ordered, and personally interpreted labs.  The pertinent results include: Urinary tract infection  Imaging Studies ordered: I ordered imaging studies including CT scan renal  I independently visualized and interpreted imaging which showed no acute findings I agree with the radiologist interpretation  Medicines ordered and prescription drug management: I ordered medication including Keflex  for UTI  Reevaluation: After the interventions noted above, I reevaluated the patient and found that they have :improved  Complexity of problems addressed: Patients presentation is most consistent with  acute presentation with potential threat to life or bodily function  Disposition: After consideration of the diagnostic results and the patients response to treatment,  I feel that the patent would benefit from discharge  .         Final diagnoses:  Acute cystitis without hematuria    ED Discharge Orders          Ordered    cephALEXin  (KEFLEX ) 500 MG capsule  4 times daily        12/06/24 0133               Midge Golas, MD 12/06/24 0136  "

## 2024-12-06 NOTE — Discharge Instructions (Addendum)
 Start taking your antibiotic on Friday Follow-up with your primary doctor next week Please answer all phone calls from the hospital as they may have to change her antibiotic
